# Patient Record
Sex: Male | Born: 1940
Health system: Southern US, Community
[De-identification: ages and names within clinical notes are randomized; demographics above are authoritative.]

## PROBLEM LIST (undated history)

## (undated) DIAGNOSIS — I452 Bifascicular block: Secondary | ICD-10-CM

## (undated) DIAGNOSIS — E1165 Type 2 diabetes mellitus with hyperglycemia: Secondary | ICD-10-CM

## (undated) DIAGNOSIS — Z86010 Personal history of colonic polyps: Secondary | ICD-10-CM

## (undated) DIAGNOSIS — N183 Chronic kidney disease, stage 3 unspecified: Secondary | ICD-10-CM

## (undated) DIAGNOSIS — I2510 Atherosclerotic heart disease of native coronary artery without angina pectoris: Secondary | ICD-10-CM

## (undated) DIAGNOSIS — N200 Calculus of kidney: Secondary | ICD-10-CM

## (undated) DIAGNOSIS — Z85828 Personal history of other malignant neoplasm of skin: Secondary | ICD-10-CM

## (undated) DIAGNOSIS — Z973 Presence of spectacles and contact lenses: Secondary | ICD-10-CM

## (undated) DIAGNOSIS — Z974 Presence of external hearing-aid: Secondary | ICD-10-CM

## (undated) DIAGNOSIS — I252 Old myocardial infarction: Secondary | ICD-10-CM

## (undated) DIAGNOSIS — E785 Hyperlipidemia, unspecified: Secondary | ICD-10-CM

## (undated) DIAGNOSIS — Z9889 Other specified postprocedural states: Secondary | ICD-10-CM

## (undated) DIAGNOSIS — IMO0002 Reserved for concepts with insufficient information to code with codable children: Secondary | ICD-10-CM

## (undated) DIAGNOSIS — K219 Gastro-esophageal reflux disease without esophagitis: Secondary | ICD-10-CM

## (undated) DIAGNOSIS — N281 Cyst of kidney, acquired: Secondary | ICD-10-CM

## (undated) DIAGNOSIS — Z860101 Personal history of adenomatous and serrated colon polyps: Secondary | ICD-10-CM

## (undated) DIAGNOSIS — Z955 Presence of coronary angioplasty implant and graft: Secondary | ICD-10-CM

## (undated) HISTORY — PX: CORONARY ANGIOPLASTY: SHX604

## (undated) HISTORY — DX: Hyperlipidemia, unspecified: E78.5

## (undated) HISTORY — PX: EXTRACORPOREAL SHOCK WAVE LITHOTRIPSY: SHX1557

## (undated) HISTORY — PX: CORONARY ANGIOPLASTY WITH STENT PLACEMENT: SHX49

## (undated) HISTORY — PX: CARDIOVASCULAR STRESS TEST: SHX262

---

## 1996-03-18 HISTORY — PX: LUMBAR MICRODISCECTOMY: SHX99

## 1998-03-18 DIAGNOSIS — C4491 Basal cell carcinoma of skin, unspecified: Secondary | ICD-10-CM

## 1998-03-18 HISTORY — DX: Basal cell carcinoma of skin, unspecified: C44.91

## 2002-07-16 ENCOUNTER — Ambulatory Visit (HOSPITAL_COMMUNITY): Admission: RE | Admit: 2002-07-16 | Discharge: 2002-07-16 | Payer: Self-pay | Admitting: Internal Medicine

## 2006-09-03 ENCOUNTER — Ambulatory Visit (HOSPITAL_COMMUNITY): Admission: RE | Admit: 2006-09-03 | Discharge: 2006-09-03 | Payer: Self-pay | Admitting: Orthopaedic Surgery

## 2006-09-09 ENCOUNTER — Ambulatory Visit (HOSPITAL_COMMUNITY): Admission: RE | Admit: 2006-09-09 | Discharge: 2006-09-09 | Payer: Self-pay | Admitting: Orthopaedic Surgery

## 2009-03-18 DIAGNOSIS — C4492 Squamous cell carcinoma of skin, unspecified: Secondary | ICD-10-CM

## 2009-03-18 HISTORY — DX: Squamous cell carcinoma of skin, unspecified: C44.92

## 2010-08-03 NOTE — Op Note (Signed)
   NAME:  Maurice Ross, Maurice Ross                           ACCOUNT NO.:  [id number]   MEDICAL RECORD NO.:  [id number]                   PATIENT TYPE:  AMB   LOCATION:  DAY                                  FACILITY:  [facility]   PHYSICIAN:  Lionel December, M.D.                 DATE OF BIRTH:  08-15-1940   DATE OF PROCEDURE:  07/16/2002  DATE OF DISCHARGE:                                 OPERATIVE REPORT   PROCEDURE:  Total colonoscopy.   INDICATIONS FOR PROCEDURE:  The patient is a 70 year old Caucasian male who  is undergoing screening colonoscopy.  The procedure was reviewed with the  patient, and informed consent was obtained.   PREOPERATIVE MEDICATIONS:  Demerol 25 mg IV, Versed 4 mg IV in divided  doses.   INSTRUMENT USED:  Olympus video system.   FINDINGS:  The procedure was performed in the endoscopy suite.  The  patient's vital signs and O2 saturations were monitored during the procedure  and remained stable.  The patient was placed in the left lateral recumbent  position and rectal examination performed.  No abnormality noted on external  or digital exam.  The scope was placed into the rectum and advanced to the  region of the sigmoid colon and beyond.  The preparation was excellent.  The  scope was passed to the cecum which was identified by the appendiceal  orifice and ileocecal valve.  There was a tiny polyp close to the  appendiceal orifice which was ablated by cold biopsy.  As the scope was  withdrawn, the colonic mucosa was carefully examined and was normal  throughout.  The rectal mucosa similarly was normal.  The scope was  retroflexed to examine the anorectal junction which was unremarkable.  The  endoscope was straightened and withdrawn.  The patient tolerated the  procedure well.   FINAL DIAGNOSIS:  Single small polyp at cecum which was ablated by cold  biopsy.  Colonoscopy otherwise normal.   RECOMMENDATIONS:  He will resume his usual medications.  I will contact the  patient with the biopsy results and further recommendations.  If this is an  adenoma, he will return for followup exam in 5 years, otherwise will wait  for 10.                                               Lionel December, M.D.   NR/MEDQ  D:  07/16/2002  T:  07/16/2002  Job:  [id number]   cc:   Selinda Flavin  [address]  [place]  [place] [address]  Fax: [phone]

## 2011-06-24 DIAGNOSIS — I2510 Atherosclerotic heart disease of native coronary artery without angina pectoris: Secondary | ICD-10-CM | POA: Diagnosis not present

## 2011-06-24 DIAGNOSIS — E785 Hyperlipidemia, unspecified: Secondary | ICD-10-CM | POA: Diagnosis not present

## 2011-06-24 DIAGNOSIS — IMO0001 Reserved for inherently not codable concepts without codable children: Secondary | ICD-10-CM | POA: Diagnosis not present

## 2011-07-01 DIAGNOSIS — Z79899 Other long term (current) drug therapy: Secondary | ICD-10-CM | POA: Diagnosis not present

## 2011-07-01 DIAGNOSIS — E119 Type 2 diabetes mellitus without complications: Secondary | ICD-10-CM | POA: Diagnosis not present

## 2011-07-01 DIAGNOSIS — E559 Vitamin D deficiency, unspecified: Secondary | ICD-10-CM | POA: Diagnosis not present

## 2011-07-01 DIAGNOSIS — I2510 Atherosclerotic heart disease of native coronary artery without angina pectoris: Secondary | ICD-10-CM | POA: Diagnosis not present

## 2011-07-01 DIAGNOSIS — E7800 Pure hypercholesterolemia, unspecified: Secondary | ICD-10-CM | POA: Diagnosis not present

## 2011-07-29 DIAGNOSIS — H353190 Nonexudative age-related macular degeneration, unspecified eye, stage unspecified: Secondary | ICD-10-CM | POA: Diagnosis not present

## 2011-10-14 DIAGNOSIS — T169XXA Foreign body in ear, unspecified ear, initial encounter: Secondary | ICD-10-CM | POA: Diagnosis not present

## 2011-10-24 DIAGNOSIS — Z9861 Coronary angioplasty status: Secondary | ICD-10-CM | POA: Diagnosis not present

## 2011-10-24 DIAGNOSIS — I2510 Atherosclerotic heart disease of native coronary artery without angina pectoris: Secondary | ICD-10-CM | POA: Diagnosis not present

## 2011-10-24 DIAGNOSIS — E119 Type 2 diabetes mellitus without complications: Secondary | ICD-10-CM | POA: Diagnosis not present

## 2011-10-24 DIAGNOSIS — I10 Essential (primary) hypertension: Secondary | ICD-10-CM | POA: Diagnosis not present

## 2011-11-05 DIAGNOSIS — Z79899 Other long term (current) drug therapy: Secondary | ICD-10-CM | POA: Diagnosis not present

## 2011-11-05 DIAGNOSIS — E782 Mixed hyperlipidemia: Secondary | ICD-10-CM | POA: Diagnosis not present

## 2011-11-15 DIAGNOSIS — N201 Calculus of ureter: Secondary | ICD-10-CM | POA: Diagnosis not present

## 2011-11-15 DIAGNOSIS — Z7982 Long term (current) use of aspirin: Secondary | ICD-10-CM | POA: Diagnosis not present

## 2011-11-15 DIAGNOSIS — N281 Cyst of kidney, acquired: Secondary | ICD-10-CM | POA: Diagnosis not present

## 2011-11-15 DIAGNOSIS — N289 Disorder of kidney and ureter, unspecified: Secondary | ICD-10-CM | POA: Diagnosis not present

## 2011-11-15 DIAGNOSIS — Z79899 Other long term (current) drug therapy: Secondary | ICD-10-CM | POA: Diagnosis not present

## 2011-11-15 DIAGNOSIS — E119 Type 2 diabetes mellitus without complications: Secondary | ICD-10-CM | POA: Diagnosis not present

## 2011-11-15 DIAGNOSIS — Z87442 Personal history of urinary calculi: Secondary | ICD-10-CM | POA: Diagnosis not present

## 2011-11-15 DIAGNOSIS — N23 Unspecified renal colic: Secondary | ICD-10-CM | POA: Diagnosis not present

## 2011-11-21 DIAGNOSIS — I2510 Atherosclerotic heart disease of native coronary artery without angina pectoris: Secondary | ICD-10-CM | POA: Diagnosis not present

## 2011-11-21 DIAGNOSIS — D4959 Neoplasm of unspecified behavior of other genitourinary organ: Secondary | ICD-10-CM | POA: Diagnosis not present

## 2011-11-21 DIAGNOSIS — E119 Type 2 diabetes mellitus without complications: Secondary | ICD-10-CM | POA: Diagnosis not present

## 2011-11-21 DIAGNOSIS — E782 Mixed hyperlipidemia: Secondary | ICD-10-CM | POA: Diagnosis not present

## 2011-11-21 DIAGNOSIS — N200 Calculus of kidney: Secondary | ICD-10-CM | POA: Diagnosis not present

## 2011-11-21 DIAGNOSIS — N281 Cyst of kidney, acquired: Secondary | ICD-10-CM | POA: Diagnosis not present

## 2011-11-22 ENCOUNTER — Other Ambulatory Visit: Payer: Self-pay | Admitting: Urology

## 2011-11-22 ENCOUNTER — Encounter (HOSPITAL_COMMUNITY): Payer: Self-pay | Admitting: *Deleted

## 2011-11-22 NOTE — Progress Notes (Signed)
Instructed to read every page in blue folder and fill out history for for [facility]. No medications on the restricted med list per [facility]. Laxative as directed and light supper day before procedure. Light breakfast at 7am and clear liquids till 11 am then NPO. Do not take diabetes medication no aspirin (held since 11/20/11) or ibuprofen, vutamins or herbal meds.  Must have driver

## 2011-11-25 ENCOUNTER — Encounter (HOSPITAL_COMMUNITY)
Admission: RE | Disposition: A | Discharge status: Home / Self Care | Payer: Self-pay | Source: Ambulatory Visit | Attending: Urology

## 2011-11-25 ENCOUNTER — Ambulatory Visit (HOSPITAL_COMMUNITY): Payer: Medicare Other

## 2011-11-25 ENCOUNTER — Encounter (HOSPITAL_COMMUNITY): Payer: Self-pay | Admitting: *Deleted

## 2011-11-25 ENCOUNTER — Ambulatory Visit (HOSPITAL_COMMUNITY)
Admission: RE | Admit: 2011-11-25 | Discharge: 2011-11-25 | Disposition: A | Discharge status: Home / Self Care | Payer: Medicare Other | Source: Ambulatory Visit | Attending: Urology | Admitting: Urology

## 2011-11-25 DIAGNOSIS — R109 Unspecified abdominal pain: Secondary | ICD-10-CM | POA: Diagnosis not present

## 2011-11-25 DIAGNOSIS — E119 Type 2 diabetes mellitus without complications: Secondary | ICD-10-CM | POA: Insufficient documentation

## 2011-11-25 DIAGNOSIS — D4959 Neoplasm of unspecified behavior of other genitourinary organ: Secondary | ICD-10-CM | POA: Diagnosis not present

## 2011-11-25 DIAGNOSIS — I252 Old myocardial infarction: Secondary | ICD-10-CM | POA: Diagnosis not present

## 2011-11-25 DIAGNOSIS — Z79899 Other long term (current) drug therapy: Secondary | ICD-10-CM | POA: Diagnosis not present

## 2011-11-25 DIAGNOSIS — N281 Cyst of kidney, acquired: Secondary | ICD-10-CM | POA: Insufficient documentation

## 2011-11-25 DIAGNOSIS — Z7982 Long term (current) use of aspirin: Secondary | ICD-10-CM | POA: Insufficient documentation

## 2011-11-25 DIAGNOSIS — N200 Calculus of kidney: Secondary | ICD-10-CM | POA: Diagnosis not present

## 2011-11-25 HISTORY — DX: Atherosclerotic heart disease of native coronary artery without angina pectoris: I25.10

## 2011-11-25 SURGERY — LITHOTRIPSY, ESWL
Anesthesia: LOCAL | Laterality: Left

## 2011-11-25 MED ORDER — DIPHENHYDRAMINE HCL 25 MG PO CAPS
25.0000 mg | ORAL_CAPSULE | ORAL | Status: AC
  Administered 2011-11-25: 25 mg via ORAL
  Filled 2011-11-25: qty 1

## 2011-11-25 MED ORDER — DEXTROSE-NACL 5-0.45 % IV SOLN
INTRAVENOUS | Status: DC
  Administered 2011-11-25: 16:00:00 via INTRAVENOUS

## 2011-11-25 MED ORDER — CIPROFLOXACIN HCL 500 MG PO TABS
500.0000 mg | ORAL_TABLET | ORAL | Status: AC
  Administered 2011-11-25: 500 mg via ORAL
  Filled 2011-11-25: qty 1

## 2011-11-25 MED ORDER — DIAZEPAM 5 MG PO TABS
10.0000 mg | ORAL_TABLET | ORAL | Status: AC
  Administered 2011-11-25: 10 mg via ORAL
  Filled 2011-11-25: qty 2

## 2011-11-25 NOTE — H&P (Signed)
History of Present Illness  Maurice Ross is referred by Dr Selinda Flavin for evaluation of left upper quadrant pain.  Maurice Ross was seen in the ER at [facility] for severe left upper quadrant pain.  The pain was not associated with nausea and vomiting.  He has nocturia.  He does not gross hematuria.  He has a past history of kidney stone.  CT scan revealed a 1 cm non obstructing stone in the lower pole of the left kidney, bilateral renalcysts and a subcentimeter hyperdense lesion in the interpolar region of the left kidney.  He still has mild left upper quadrant pain.  He takes hydrocodone as needed for the pain.  BUN is 35, creatinine 2.21.   Past Medical History Problems  1. History of  Acute Myocardial Infarction V12.59 2. History of  Diabetes Mellitus 250.00 3. History of  Heartburn 787.1  Surgical History Problems  1. History of  Back Surgery 2. History of  Cath Stent Placement  Current Meds 1. Aspirin 325 MG Oral Tablet; Therapy: (Recorded:05Sep2013) to 2. Crestor 40 MG Oral Tablet; Therapy: (Recorded:05Sep2013) to 3. Fish Oil CAPS; Therapy: (Recorded:05Sep2013) to 4. GlipiZIDE 10 MG Oral Tablet; Therapy: (Recorded:05Sep2013) to 5. Hydrochlorothiazide 25 MG Oral Tablet; Therapy: (Recorded:05Sep2013) to 6. Lisinopril 10 MG Oral Tablet; Therapy: (Recorded:05Sep2013) to 7. MetFORMIN HCl 500 MG Oral Tablet; Therapy: (Recorded:05Sep2013) to 8. Metoprolol Tartrate 25 MG Oral Tablet; Therapy: (Recorded:05Sep2013) to 9. Omeprazole 20 MG Oral Capsule Delayed Release; Therapy: (Recorded:05Sep2013) to  Allergies Medication  1. No Known Drug Allergies  Family History Problems  1. Family history of  Family Health Status - Father's Age 78. Family history of  Family Health Status - Mother's Age 40. Family history of  Family Health Status Number Of Children 4. Family history of  Heart Disease V17.49  Social History Problems    Caffeine Use   Marital History - Currently Married   Never  A Smoker   Occupation: Denied    History of  Alcohol Use  Review of Systems Genitourinary, constitutional, skin, eye, otolaryngeal, hematologic/lymphatic, cardiovascular, pulmonary, endocrine, musculoskeletal, gastrointestinal, neurological and psychiatric system(s) were reviewed and pertinent findings if present are noted.  Genitourinary: nocturia.  Gastrointestinal: abdominal pain, heartburn and constipation.    Vitals Vital Signs [Data Includes: Last 1 Day]  05Sep2013 03:13PM  BMI Calculated: 30.62 BSA Calculated: 2.05 Height: 5 ft 8 in Weight: 202 lb  Blood Pressure: 100 / 61 Heart Rate: 49 Respiration: 18  Physical Exam Constitutional: Well nourished and well developed . No acute distress.  ENT:. The ears and nose are normal in appearance.  Neck: The appearance of the neck is normal and no neck mass is present.  Pulmonary: No respiratory distress and normal respiratory rhythm and effort.  Cardiovascular: Heart rate and rhythm are normal . No peripheral edema.  Abdomen: The abdomen is soft and nontender. No masses are palpated. No CVA tenderness. No hernias are palpable. No hepatosplenomegaly noted.  Rectal: Rectal exam demonstrates normal sphincter tone, no tenderness and no masses. The prostate has no nodularity and is not tender. The left seminal vesicle is nonpalpable. The right seminal vesicle is nonpalpable. The perineum is normal on inspection.  Genitourinary: Examination of the penis demonstrates no discharge, no masses, no lesions and a normal meatus. The scrotum is without lesions. The right epididymis is palpably normal and non-tender. The left epididymis is palpably normal and non-tender. The right testis is non-tender and without masses. The left testis is non-tender and without masses.  Lymphatics: The femoral and inguinal nodes are not enlarged or tender.  Skin: Normal skin turgor, no visible rash and no visible skin lesions.  Neuro/Psych:. Mood and affect are  appropriate.    Results/Data Urine [Data Includes: Last 1 Day]   05Sep2013  COLOR YELLOW   APPEARANCE CLOUDY   SPECIFIC GRAVITY 1.020   pH 5.5   GLUCOSE 250 mg/dL  BILIRUBIN NEG   KETONE NEG mg/dL  BLOOD LARGE   PROTEIN 100 mg/dL  UROBILINOGEN 0.2 mg/dL  NITRITE NEG   LEUKOCYTE ESTERASE NEG   SQUAMOUS EPITHELIAL/HPF RARE   WBC NONE SEEN WBC/hpf  RBC NONE SEEN RBC/hpf  BACTERIA NONE SEEN   CRYSTALS NONE SEEN   CASTS NONE SEEN     I independently reviewed the CT scan and the findings are as noted above.   Assessment Assessed  1. Nephrolithiasis Of The Left Kidney 592.0 2. Renal Neoplasm 239.5 3. Renal Cyst 593.2  Plan Health Maintenance (V70.0)  1. UA With REFLEX  Done: 05Sep2013 02:22PM Nephrolithiasis Of The Left Kidney (592.0)  2. Follow-up Schedule Surgery Office  Follow-up  Requested for: 05Sep2013   Since the patient is symptomatic will do ESL of the renal calculus.  The procedure, risks, benefits were explained to him.  The risks include but are not limited to renal or perirenal hematoma, injury to adjacent organs, steinstrasse.  He understands and wishes to proceed.  Will get renal ultrasound for further evaluation of the left renal hyperdense lesion.   Signatures  CC: Dr Selinda Flavin  Electronically signed by : Su Grand, M.D.; Nov 21 2011  5:59PM

## 2011-11-25 NOTE — Op Note (Signed)
Refer to [facility] Op Note scanned in the chart 

## 2011-12-09 ENCOUNTER — Encounter (INDEPENDENT_AMBULATORY_CARE_PROVIDER_SITE_OTHER): Payer: Self-pay

## 2011-12-17 DIAGNOSIS — Z23 Encounter for immunization: Secondary | ICD-10-CM | POA: Diagnosis not present

## 2011-12-19 DIAGNOSIS — N200 Calculus of kidney: Secondary | ICD-10-CM | POA: Diagnosis not present

## 2011-12-20 DIAGNOSIS — N200 Calculus of kidney: Secondary | ICD-10-CM | POA: Diagnosis not present

## 2011-12-30 ENCOUNTER — Ambulatory Visit (INDEPENDENT_AMBULATORY_CARE_PROVIDER_SITE_OTHER): Payer: Medicare Other | Admitting: Internal Medicine

## 2011-12-30 ENCOUNTER — Other Ambulatory Visit (INDEPENDENT_AMBULATORY_CARE_PROVIDER_SITE_OTHER): Payer: Self-pay | Admitting: *Deleted

## 2011-12-30 ENCOUNTER — Encounter (INDEPENDENT_AMBULATORY_CARE_PROVIDER_SITE_OTHER): Payer: Self-pay | Admitting: Internal Medicine

## 2011-12-30 ENCOUNTER — Telehealth (INDEPENDENT_AMBULATORY_CARE_PROVIDER_SITE_OTHER): Payer: Self-pay | Admitting: *Deleted

## 2011-12-30 VITALS — BP 118/72 | HR 80 | Temp 97.8°F | Resp 18 | Ht 68.0 in | Wt 197.1 lb

## 2011-12-30 DIAGNOSIS — Z87442 Personal history of urinary calculi: Secondary | ICD-10-CM | POA: Insufficient documentation

## 2011-12-30 DIAGNOSIS — K219 Gastro-esophageal reflux disease without esophagitis: Secondary | ICD-10-CM

## 2011-12-30 DIAGNOSIS — Z1211 Encounter for screening for malignant neoplasm of colon: Secondary | ICD-10-CM | POA: Diagnosis not present

## 2011-12-30 DIAGNOSIS — R1310 Dysphagia, unspecified: Secondary | ICD-10-CM

## 2011-12-30 DIAGNOSIS — Z86010 Personal history of colonic polyps: Secondary | ICD-10-CM

## 2011-12-30 DIAGNOSIS — E119 Type 2 diabetes mellitus without complications: Secondary | ICD-10-CM | POA: Insufficient documentation

## 2011-12-30 DIAGNOSIS — E785 Hyperlipidemia, unspecified: Secondary | ICD-10-CM | POA: Insufficient documentation

## 2011-12-30 DIAGNOSIS — I2510 Atherosclerotic heart disease of native coronary artery without angina pectoris: Secondary | ICD-10-CM | POA: Insufficient documentation

## 2011-12-30 MED ORDER — PANTOPRAZOLE SODIUM 40 MG PO TBEC: 40.0000 mg | DELAYED_RELEASE_TABLET | Freq: Two times a day (BID) | ORAL | Status: DC

## 2011-12-30 MED ORDER — PEG-KCL-NACL-NASULF-NA ASC-C 100 G PO SOLR: 1.0000 | Freq: Once | ORAL | Status: DC

## 2011-12-30 NOTE — Consult Note (Signed)
CONSULTING PHYSICIAN: Selinda Flavin, MD  PRESENTING COMPLAINT: Dysphagia, cough and hoarseness. Patient is  also interested in screening colonoscopy.  HISTORY OF PRESENT ILLNESS: The patient is a 71 year old Caucasian male  who is referred through courtesy of Dr. Selinda Flavin for GI evaluation.  He is accompanied by his wife today. He has had symptoms of GERD for  about 10 years. He states he has had swallowing difficulty for the last  few years. It has been intermittent. He has difficulty with solids as  well as liquids. Lately, he has noted nocturnal cough. He wakes up  couple of times in a week with this cough. He also has coughing spells  during the daytime and intermittent hoarseness. He denies frequent  regurgitation or burping. He also complains of infrequent heartburn as  long as he takes his medication. He does admit that he eats too fast.  Food bolus always passes down. He has a good appetite. He is trying  hard to lose weight. He has lost 10 pounds in the last 3 months. He  wants to lose another 20 pounds. The patient is also interested in  colonoscopy. His last exam was in May 2004, with removal of small cecal  adenoma. He denies melena or rectal bleeding.  PRESENT MEDICATIONS:  1. Aspirin 325 mg p.o. daily.  2. Fish oil 1.2 g p.o. b.i.d.  3. Glipizide 10 mg p.o. b.i.d.  4. Hydrochlorothiazide 25 mg p.o. daily.  5. Lisinopril 15 mg p.o. daily.  6. Metformin 1 g p.o. b.i.d.  7. Metoprolol tartrate 25 mg p.o. b.i.d.  8. Omeprazole 20 mg p.o. q.a.m.  9. Rosuvastatin 40 mg p.o. daily.  PAST MEDICAL HISTORY:  1. Coronary artery disease. He had stenting to single vessel in July  1997. He had another intervention in March 1998, what sounds like  angioplasty from his description. He had exercise tolerance test  in August 2013, and it was fine. He is under care of Dr. Nicki Guadalajara of [facility] cardiovascular.  2. Chronic GERD. He has had symptoms for more than 10 years.  3.  Hyperlipidemia.  4. Diabetes mellitus diagnosed in 1998. His hemoglobin A1c was 8.8 in  April, and he will have another 1 later this year.  5. He had lumbar  spine surgery for his disk disease in January 1998.  6. He has history  of left kidney stones. He underwent lithotripsy and passed  multiple stones this year.  ALLERGIES: NK.  FAMILY HISTORY: Father is 56 years old and is cared for [employer]. He has  early dementia and is blind. Mother is 19 and lives at [facility] in  [place], [place]. The patient does not have any siblings.  SOCIAL HISTORY: He is married and has 2 sons in good health. 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 He has never  smoked cigarettes or drank alcohol.  PHYSICAL EXAMINATION: VITAL SIGNS: Weight 197 pounds, he is 68 inches  tall, pulse 80 per minute and regular, blood pressure 118/72,  respiratory rate is 18, and temp is 97.8.  HEENT: Conjunctivae are pink. Sclerae are nonicteric. Oropharyngeal  mucosa is normal. Few teeth are missing, rest of the dentition in  satisfactory condition.  NECK: No neck masses or thyromegaly noted.  CARDIAC: Regular rhythm. Normal S1 and S2. No murmur or gallop noted.  LUNGS: Clear to auscultation.  ABDOMEN: Full. Bowel sounds are normal. On palpation, it is soft and  nontender without organomegaly or masses.  RECTAL: Deferred.  EXTREMITIES: No peripheral edema or clubbing noted.  ASSESSMENT: Dysphagia. He has had this symptom intermittently for  several months. He has sporadic symptoms and difficulty both with  solids as well as liquids. He is also experiencing intermittent diurnal  and nocturnal cough as well as hoarseness and, therefore, his  gastroesophageal reflux disease symptoms are not controlled  satisfactorily. He could have esophageal ring or a stricture. He needs  to undergo EGD both for diagnostic and therapeutic purposes.  He would appear to be average risk for colorectal carcinoma.  Last  colonoscopy over 9 years ago revealed single small adenoma which  necessarily would not put him in high-risk category.  RECOMMENDATIONS:  1. Continue anti-reflux measures.  2. Discontinue omeprazole.  3. Pantoprazole 40 mg p.o. b.i.d. prescription given for 60 with 5  refills. Dose would eventually be decreased to once a day.  4. Esophagogastroduodenoscopy, possible esophageal dilation and  colonoscopy to be performed at [facility] in near future.Patient  advised to hold off aspirin for 2 days prior to procedure.  We appreciate the opportunity to participate in the care of this  gentleman.

## 2011-12-30 NOTE — Telephone Encounter (Signed)
Patient needs movi prep 

## 2011-12-30 NOTE — Patient Instructions (Signed)
Discontinue omeprazole. Pantoprazole 40 mg by mouth 30 minutes before breakfast and evening meal daily.  Esophagogastroduodenoscopy, possible dilation and colonoscopy to be scheduled. Hold aspirin for 2 days prior to procedure.

## 2011-12-30 NOTE — Consult Note (Signed)
NAME:  Maurice Ross, NUTTING NO.:  [id number]  MEDICAL RECORD NO.:  [id number]  LOCATION:  [facility]                         FACILITY:  [facility]  PHYSICIAN:  Lionel December, M.D.    DATE OF BIRTH:  1940-10-24  DATE OF CONSULTATION:  12/30/2011 DATE OF DISCHARGE:  11/25/2011                                CONSULTATION   CONSULTING PHYSICIAN:  Selinda Flavin, MD  PRESENTING COMPLAINT:  Dysphagia, cough and hoarseness. Patient is  also interested in screening colonoscopy.  HISTORY OF PRESENT ILLNESS:  The patient is a 71 year old Caucasian male who is referred through courtesy of Dr. Selinda Flavin for GI evaluation. He is accompanied by his wife today.  He has had symptoms of GERD for about 10 years.  He states he has had swallowing difficulty for the last few years.  It has been intermittent.  He has difficulty with solids as well as liquids.  Lately, he has noted nocturnal cough.  He wakes up couple of times in a week with this cough.  He also has coughing spells during the daytime and intermittent hoarseness.  He denies frequent regurgitation or burping.  He also complains of infrequent heartburn as long as he takes his medication.  He does admit that he eats too fast. Food bolus always passes down.  He has a good appetite.  He is trying hard to lose weight.  He has lost 10 pounds in the last 3 months.  He wants to lose another 20 pounds.  The patient is also interested in colonoscopy.  His last exam was in May 2004, with removal of small cecal adenoma.  He denies melena or rectal bleeding.  PRESENT MEDICATIONS: 1. Aspirin 325 mg p.o. daily. 2. Fish oil 1.2 g p.o. b.i.d. 3. Glipizide 10 mg p.o. b.i.d. 4. Hydrochlorothiazide 25 mg p.o. daily. 5. Lisinopril 15 mg p.o. daily. 6. Metformin 1 g p.o. b.i.d. 7. Metoprolol tartrate 25 mg p.o. b.i.d. 8. Omeprazole 20 mg p.o. q.a.m. 9. Rosuvastatin 40 mg p.o. daily.  PAST MEDICAL HISTORY: 1. Coronary artery disease.  He had  stenting to single vessel in July     1997.  He had another intervention in March 1998, what sounds like     angioplasty from his description.  He had exercise tolerance test     in August 2013, and it was fine.  He is under care of Dr. Nicki Guadalajara of [facility]. 2. Chronic GERD.  He has had symptoms for more than 10 years. 3. Hyperlipidemia. 4. Diabetes mellitus diagnosed in 1998.  His hemoglobin A1c was 8.8 in     April, and he will have another 1 later this year.   5. He had lumbar     spine surgery for his disk disease in January 1998.   6. He has history     of left kidney stones.  He underwent lithotripsy and passed     multiple stones this year.  ALLERGIES:  NK.  FAMILY HISTORY:  Father is 50 years old and is cared for [employer].  He has early dementia and is blind.  Mother is 58 and lives at [facility] in  [place], [place].  The patient does not have any siblings.  SOCIAL HISTORY:  He is married and has 2 sons in good health.  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  He has never smoked cigarettes or drank alcohol.  PHYSICAL EXAMINATION:  VITAL SIGNS:  Weight 197 pounds, he is 68 inches tall, pulse 80 per minute and regular, blood pressure 118/72, respiratory rate is 18, and temp is 97.8. HEENT:  Conjunctivae are pink.  Sclerae are nonicteric.  Oropharyngeal mucosa is normal.  Few teeth are missing, rest of the dentition in satisfactory condition. NECK:  No neck masses or thyromegaly noted. CARDIAC:  Regular rhythm.  Normal S1 and S2.  No murmur or gallop noted. LUNGS:  Clear to auscultation. ABDOMEN:  Full.  Bowel sounds are normal.  On palpation, it is soft and nontender without organomegaly or masses. RECTAL:  Deferred. EXTREMITIES:  No peripheral edema or clubbing noted.  ASSESSMENT:  Dysphagia.  He has had this symptom intermittently for several months.  He has sporadic symptoms and difficulty both with solids as  well as liquids.  He is also experiencing intermittent diurnal and nocturnal cough as well as hoarseness and, therefore, his gastroesophageal reflux disease symptoms are not controlled satisfactorily.  He could have esophageal ring or a stricture.  He needs to undergo EGD both for diagnostic and therapeutic purposes.  He would appear to be average risk for colorectal carcinoma.  Last colonoscopy over 9 years ago revealed single small adenoma which necessarily would not put him in high-risk category.  RECOMMENDATIONS: 1. Continue anti-reflux measures. 2. Discontinue omeprazole. 3. Pantoprazole 40 mg p.o. b.i.d. prescription given for 60 with 5     refills.  Dose would eventually be decreased to once a day. 4. Esophagogastroduodenoscopy, possible esophageal dilation and     colonoscopy to be performed at [facility] in near future.Patient     advised to hold off aspirin for 2 days prior to procedure.  We appreciate the opportunity to participate in the care of this gentleman.          ______________________________ Lionel December, M.D.     NR/MEDQ  D:  12/30/2011  T:  12/30/2011  Job:  [id number]  cc:   Selinda Flavin, MD Fax: [phone]

## 2011-12-31 NOTE — Progress Notes (Signed)
p 

## 2012-01-06 DIAGNOSIS — N200 Calculus of kidney: Secondary | ICD-10-CM | POA: Diagnosis not present

## 2012-01-16 ENCOUNTER — Encounter (HOSPITAL_COMMUNITY): Payer: Self-pay | Admitting: Pharmacy Technician

## 2012-01-24 ENCOUNTER — Encounter (HOSPITAL_COMMUNITY)
Admission: RE | Disposition: A | Discharge status: Home / Self Care | Payer: Self-pay | Source: Ambulatory Visit | Attending: Internal Medicine

## 2012-01-24 ENCOUNTER — Encounter (HOSPITAL_COMMUNITY): Payer: Self-pay | Admitting: *Deleted

## 2012-01-24 ENCOUNTER — Ambulatory Visit (HOSPITAL_COMMUNITY)
Admission: RE | Admit: 2012-01-24 | Discharge: 2012-01-24 | Disposition: A | Discharge status: Home / Self Care | Payer: Medicare Other | Source: Ambulatory Visit | Attending: Internal Medicine | Admitting: Internal Medicine

## 2012-01-24 DIAGNOSIS — E119 Type 2 diabetes mellitus without complications: Secondary | ICD-10-CM | POA: Insufficient documentation

## 2012-01-24 DIAGNOSIS — Z1211 Encounter for screening for malignant neoplasm of colon: Secondary | ICD-10-CM | POA: Insufficient documentation

## 2012-01-24 DIAGNOSIS — K219 Gastro-esophageal reflux disease without esophagitis: Secondary | ICD-10-CM

## 2012-01-24 DIAGNOSIS — K2960 Other gastritis without bleeding: Secondary | ICD-10-CM

## 2012-01-24 DIAGNOSIS — K2940 Chronic atrophic gastritis without bleeding: Secondary | ICD-10-CM | POA: Diagnosis not present

## 2012-01-24 DIAGNOSIS — K2970 Gastritis, unspecified, without bleeding: Secondary | ICD-10-CM

## 2012-01-24 DIAGNOSIS — R1310 Dysphagia, unspecified: Secondary | ICD-10-CM | POA: Insufficient documentation

## 2012-01-24 DIAGNOSIS — D126 Benign neoplasm of colon, unspecified: Secondary | ICD-10-CM | POA: Insufficient documentation

## 2012-01-24 DIAGNOSIS — Z86010 Personal history of colonic polyps: Secondary | ICD-10-CM

## 2012-01-24 DIAGNOSIS — K5730 Diverticulosis of large intestine without perforation or abscess without bleeding: Secondary | ICD-10-CM

## 2012-01-24 HISTORY — PX: MALONEY DILATION: SHX5535

## 2012-01-24 HISTORY — PX: SAVORY DILATION: SHX5439

## 2012-01-24 HISTORY — PX: BALLOON DILATION: SHX5330

## 2012-01-24 HISTORY — PX: COLONOSCOPY WITH ESOPHAGOGASTRODUODENOSCOPY (EGD): SHX5779

## 2012-01-24 SURGERY — COLONOSCOPY WITH ESOPHAGOGASTRODUODENOSCOPY (EGD)
Anesthesia: Moderate Sedation

## 2012-01-24 MED ORDER — MIDAZOLAM HCL 5 MG/5ML IJ SOLN
INTRAMUSCULAR | Status: AC
  Filled 2012-01-24: qty 10

## 2012-01-24 MED ORDER — SODIUM CHLORIDE 0.45 % IV SOLN
INTRAVENOUS | Status: DC
  Administered 2012-01-24: 10:00:00 via INTRAVENOUS

## 2012-01-24 MED ORDER — STERILE WATER FOR IRRIGATION IR SOLN
Status: DC | PRN
  Administered 2012-01-24: 11:00:00

## 2012-01-24 MED ORDER — MEPERIDINE HCL 50 MG/ML IJ SOLN
INTRAMUSCULAR | Status: AC
  Filled 2012-01-24: qty 1

## 2012-01-24 MED ORDER — MEPERIDINE HCL 50 MG/ML IJ SOLN
INTRAMUSCULAR | Status: DC | PRN
  Administered 2012-01-24: 25 mg via INTRAVENOUS

## 2012-01-24 MED ORDER — MIDAZOLAM HCL 5 MG/5ML IJ SOLN
INTRAMUSCULAR | Status: DC | PRN
  Administered 2012-01-24 (×2): 2 mg via INTRAVENOUS

## 2012-01-24 NOTE — Op Note (Signed)
EGD PROCEDURE REPORT  PATIENT:  Maurice Ross  MR#:  [id number] Birthdate:  Oct 03, 1940, 71 y.o., male Endoscopist:  Dr. Malissa Hippo, MD Referred By:  Dr. Tyrone Nine, MD Procedure Date: 01/24/2012  Procedure:   EGD, ED & Colonoscopy.  Indications:  Patient is 71 year old Caucasian male with chronic GERD who presents with intermittent solid food dysphagia. He is also undergoing average risk screening colonoscopy.            Informed Consent:  The risks, benefits, alternatives & imponderables which include, but are not limited to, bleeding, infection, perforation, drug reaction and potential missed lesion have been reviewed.  The potential for biopsy, lesion removal, esophageal dilation, etc. have also been discussed.  Questions have been answered.  All parties agreeable.  Please see history & physical in medical record for more information.  Medications:  Demerol 25 mg IV Versed 4 mg IV Cetacaine spray topically for oropharyngeal anesthesia  EGD  Description of procedure:  The endoscope was introduced through the mouth and advanced to the second portion of the duodenum without difficulty or limitations. The mucosal surfaces were surveyed very carefully during advancement of the scope and upon withdrawal.  Findings:  Esophagus:  Mucosa of the esophagus was normal. GE junction was unremarkable. GEJ:  42 cm Stomach:  Stomach was empty and distended very well insufflation. Folds in the proximal stomach were normal. Examination of mucosa at body was normal. two antral erosions are noted along with focal erythema. Pyloric channel was patent. Angularis fundus and cardia were examined by retroflexing the scope and were normal. Duodenum:  Normal bulbar and post bulbar mucosa.  Therapeutic/Diagnostic Maneuvers Performed:  Esophagus dilated by passing 54 [place] Maloney dilator. Esophageal mucosa was reexamined post dilation and no mucosal disruption noted.  COLONOSCOPY Description of procedure:   After a digital rectal exam was performed, that colonoscope was advanced from the anus through the rectum and colon to the area of the cecum, ileocecal valve and appendiceal orifice. The cecum was deeply intubated. These structures were well-seen and photographed for the record. From the level of the cecum and ileocecal valve, the scope was slowly and cautiously withdrawn. The mucosal surfaces were carefully surveyed utilizing scope tip to flexion to facilitate fold flattening as needed. The scope was pulled down into the rectum where a thorough exam including retroflexion was performed.  Findings:   Prep excellent. Three small polyps ablated via cold biopsy from ascending colon and submitted together. Scattered diverticula at sigmoid colon. Normal rectal mucosa and anal rectal junction.  Therapeutic/Diagnostic Maneuvers Performed:  See above  Complications:  None  Cecal Withdrawal Time:  17 minutes  Impression:  Erosive antral gastritis otherwise normal EGD. Esophagus dilated  by passing 54 [place] Maloney dilator no mucosal disruption noted. Mild sigmoid colon diverticulosis. Three small polyps ablated via cold biopsy from ascending colon and submitted together.  Recommendations:  Continue anti-reflux measures and pantoprazole as before. H. pylori serology. I will be contacting patient with results of biopsy, blood test and further recommendations.  Deshawn Skelley U  01/24/2012 11:25 AM  CC: Dr. Selinda Flavin, MD & Dr. Bonnetta Barry ref. provider found

## 2012-01-24 NOTE — H&P (Signed)
Maurice Ross is an 71 y.o. male.   Chief Complaint: Patient is here for EGD, ED and colonoscopy. HPI: A 71 year old Caucasian male who has chronic GERD whose been experiencing intermittent hoarseness and has developed dysphagia to solids. She in the office few weeks when switched from omeprazole to pantoprazole that he has noted improvement in his hoarseness but not dysphagia. He rarely experiences heartburn. He has a good appetite. He denies abdominal pain change in bowel habits or rectal bleeding. She was last colonoscopy was 9 years ago. Family history is negative for colorectal carcinoma to  Past Medical History  Diagnosis Date  . Coronary artery disease     Coronary stent 1997  . Diabetes mellitus     medical treatment x 10 years  . Chronic kidney disease     [phone] kidney stones  . Myocardial infarction     1997    Past Surgical History  Procedure Date  . Back surgery 1998  . Stent 1998    coronary stent  . Colonoscopy 2004    By Dr[facility]Romy Ipock at [facility]  . Cardiac catheterization     Family History  Problem Relation Age of Onset  . Heart disease Mother   . Heart disease Father   . Healthy Son   . Healthy Son    Social History:  reports that he has been smoking Pipe.  He has never used smokeless tobacco. He reports that he does not drink alcohol or use illicit drugs.  Allergies: No Known Allergies  Medications Prior to Admission  Medication Sig Dispense Refill  . aspirin 325 MG tablet Take 325 mg by mouth daily.      Marland Kitchen glipiZIDE (GLUCOTROL XL) 10 MG 24 hr tablet Take 10 mg by mouth 2 (two) times daily.       . hydrochlorothiazide (HYDRODIURIL) 25 MG tablet Take 25 mg by mouth daily.      Marland Kitchen lisinopril (PRINIVIL,ZESTRIL) 10 MG tablet Take 15 mg by mouth daily.      . metFORMIN (GLUCOPHAGE) 500 MG tablet Take 1,000 mg by mouth 2 (two) times daily with a meal.       . metoprolol tartrate (LOPRESSOR) 25 MG tablet Take 25 mg by mouth 2 (two) times daily.      .  Omega-3 Fatty Acids (FISH OIL) 1200 MG CAPS Take 1 capsule by mouth daily.      Marland Kitchen omeprazole (PRILOSEC) 20 MG capsule Take 1 capsule by mouth Daily.      . pantoprazole (PROTONIX) 40 MG tablet Take 1 tablet (40 mg total) by mouth 2 (two) times daily before a meal.  60 tablet  3  . peg 3350 powder (MOVIPREP) 100 G SOLR Take 1 kit (100 g total) by mouth once.  1 kit  0  . rosuvastatin (CRESTOR) 40 MG tablet Take 40 mg by mouth daily.        No results found for this or any previous visit (from the past 48 hour(s)). No results found.  ROS  Blood pressure 132/70, pulse 73, temperature 97.8 F (36.6 C), temperature source Oral, resp. rate 16, height 5\' 8"  (1.727 m), weight 197 lb (89.359 kg), SpO2 100.00%. Physical Exam  Constitutional: He appears well-developed and well-nourished.  HENT:  Mouth/Throat: Oropharynx is clear and moist.  Eyes: Conjunctivae normal are normal. No scleral icterus.  Neck: No thyromegaly present.  Cardiovascular: Normal rate, regular rhythm and normal heart sounds.   No murmur heard. Respiratory: Effort normal and breath sounds normal.  GI: Soft. He exhibits no distension and no mass. There is no tenderness.  Musculoskeletal: He exhibits no edema.  Lymphadenopathy:    He has no cervical adenopathy.  Skin: Skin is warm and dry.     Assessment/Plan Chronic GERD. Solid food dysphagia. EGD, ED and  average risk screening colonoscopy.  Emery Dupuy U 01/24/2012, 10:40 AM

## 2012-01-27 DIAGNOSIS — N200 Calculus of kidney: Secondary | ICD-10-CM | POA: Diagnosis not present

## 2012-01-28 DIAGNOSIS — H2510 Age-related nuclear cataract, unspecified eye: Secondary | ICD-10-CM | POA: Diagnosis not present

## 2012-01-29 ENCOUNTER — Encounter (HOSPITAL_COMMUNITY): Payer: Self-pay | Admitting: Internal Medicine

## 2012-01-31 ENCOUNTER — Telehealth (INDEPENDENT_AMBULATORY_CARE_PROVIDER_SITE_OTHER): Payer: Self-pay | Admitting: *Deleted

## 2012-01-31 NOTE — Telephone Encounter (Signed)
Forward to Dr.Rehman 

## 2012-01-31 NOTE — Telephone Encounter (Signed)
Patient called and was given results of the bx - No High grade Dysplasia or Malignancy H-Pylori was < 40  Negatvie for H-pylori Patient ask if he is to continue to take the Pantoprazole that was given to him at the time of the procedure: Patient advised that we would call him back and also that Dr.Rehman may call him to go over results.

## 2012-01-31 NOTE — Telephone Encounter (Signed)
Forwarded to Dr.Rehman as FYI. 

## 2012-01-31 NOTE — Telephone Encounter (Signed)
LM asking for a return call about his procedure and lab results. The return phone number is [phone].

## 2012-02-02 NOTE — Telephone Encounter (Signed)
Patient called and message/result left on answering service. Pylori serology is negative All 3 polyps are tubular adenomas Next colonoscopy in 5 years. Please send all reports to PCP

## 2012-02-04 NOTE — Telephone Encounter (Signed)
Forward to Ann Tilley 

## 2012-02-04 NOTE — Telephone Encounter (Signed)
Noted Forwarded to Dewayne Hatch to make note of next TCS and forward results to PCP

## 2012-02-07 ENCOUNTER — Encounter (INDEPENDENT_AMBULATORY_CARE_PROVIDER_SITE_OTHER): Payer: Self-pay | Admitting: *Deleted

## 2012-02-07 NOTE — Telephone Encounter (Signed)
5 yr TCS noted, procedure note and pathology result letter faxed to PCP  

## 2012-02-19 ENCOUNTER — Ambulatory Visit: Payer: Self-pay | Admitting: Ophthalmology

## 2012-02-19 DIAGNOSIS — H2510 Age-related nuclear cataract, unspecified eye: Secondary | ICD-10-CM | POA: Diagnosis not present

## 2012-02-19 DIAGNOSIS — Z01810 Encounter for preprocedural cardiovascular examination: Secondary | ICD-10-CM | POA: Diagnosis not present

## 2012-02-19 DIAGNOSIS — Z01812 Encounter for preprocedural laboratory examination: Secondary | ICD-10-CM | POA: Diagnosis not present

## 2012-02-19 DIAGNOSIS — I10 Essential (primary) hypertension: Secondary | ICD-10-CM | POA: Diagnosis not present

## 2012-02-19 LAB — POTASSIUM: Potassium: 4.1 mmol/L (ref 3.5–5.1)

## 2012-02-28 ENCOUNTER — Ambulatory Visit: Payer: Self-pay | Admitting: Ophthalmology

## 2012-02-28 DIAGNOSIS — H269 Unspecified cataract: Secondary | ICD-10-CM | POA: Diagnosis not present

## 2012-02-28 DIAGNOSIS — K219 Gastro-esophageal reflux disease without esophagitis: Secondary | ICD-10-CM | POA: Diagnosis not present

## 2012-02-28 DIAGNOSIS — E119 Type 2 diabetes mellitus without complications: Secondary | ICD-10-CM | POA: Diagnosis not present

## 2012-02-28 DIAGNOSIS — H9190 Unspecified hearing loss, unspecified ear: Secondary | ICD-10-CM | POA: Diagnosis not present

## 2012-02-28 DIAGNOSIS — E7800 Pure hypercholesterolemia, unspecified: Secondary | ICD-10-CM | POA: Diagnosis not present

## 2012-02-28 DIAGNOSIS — Z7982 Long term (current) use of aspirin: Secondary | ICD-10-CM | POA: Diagnosis not present

## 2012-02-28 DIAGNOSIS — Z79899 Other long term (current) drug therapy: Secondary | ICD-10-CM | POA: Diagnosis not present

## 2012-02-28 DIAGNOSIS — H2510 Age-related nuclear cataract, unspecified eye: Secondary | ICD-10-CM | POA: Diagnosis not present

## 2012-02-28 DIAGNOSIS — I2510 Atherosclerotic heart disease of native coronary artery without angina pectoris: Secondary | ICD-10-CM | POA: Diagnosis not present

## 2012-02-28 DIAGNOSIS — I252 Old myocardial infarction: Secondary | ICD-10-CM | POA: Diagnosis not present

## 2012-02-28 DIAGNOSIS — Z951 Presence of aortocoronary bypass graft: Secondary | ICD-10-CM | POA: Diagnosis not present

## 2012-03-31 DIAGNOSIS — I119 Hypertensive heart disease without heart failure: Secondary | ICD-10-CM | POA: Diagnosis not present

## 2012-03-31 DIAGNOSIS — E119 Type 2 diabetes mellitus without complications: Secondary | ICD-10-CM | POA: Diagnosis not present

## 2012-03-31 DIAGNOSIS — E109 Type 1 diabetes mellitus without complications: Secondary | ICD-10-CM | POA: Diagnosis not present

## 2012-03-31 DIAGNOSIS — E7800 Pure hypercholesterolemia, unspecified: Secondary | ICD-10-CM | POA: Diagnosis not present

## 2012-03-31 DIAGNOSIS — N200 Calculus of kidney: Secondary | ICD-10-CM | POA: Diagnosis not present

## 2012-03-31 DIAGNOSIS — E785 Hyperlipidemia, unspecified: Secondary | ICD-10-CM | POA: Diagnosis not present

## 2012-03-31 DIAGNOSIS — N529 Male erectile dysfunction, unspecified: Secondary | ICD-10-CM | POA: Diagnosis not present

## 2012-03-31 DIAGNOSIS — K219 Gastro-esophageal reflux disease without esophagitis: Secondary | ICD-10-CM | POA: Diagnosis not present

## 2012-03-31 DIAGNOSIS — Z79899 Other long term (current) drug therapy: Secondary | ICD-10-CM | POA: Diagnosis not present

## 2012-05-02 ENCOUNTER — Other Ambulatory Visit: Payer: Self-pay

## 2012-05-14 ENCOUNTER — Encounter: Payer: Self-pay | Admitting: Cardiology

## 2012-05-29 DIAGNOSIS — IMO0001 Reserved for inherently not codable concepts without codable children: Secondary | ICD-10-CM | POA: Diagnosis not present

## 2012-05-29 DIAGNOSIS — E782 Mixed hyperlipidemia: Secondary | ICD-10-CM | POA: Diagnosis not present

## 2012-05-29 DIAGNOSIS — I119 Hypertensive heart disease without heart failure: Secondary | ICD-10-CM | POA: Diagnosis not present

## 2012-05-29 DIAGNOSIS — I252 Old myocardial infarction: Secondary | ICD-10-CM | POA: Diagnosis not present

## 2012-06-11 ENCOUNTER — Encounter: Payer: Self-pay | Admitting: Cardiovascular Disease

## 2012-07-20 DIAGNOSIS — E1139 Type 2 diabetes mellitus with other diabetic ophthalmic complication: Secondary | ICD-10-CM | POA: Diagnosis not present

## 2012-07-20 DIAGNOSIS — E11359 Type 2 diabetes mellitus with proliferative diabetic retinopathy without macular edema: Secondary | ICD-10-CM | POA: Diagnosis not present

## 2012-10-21 ENCOUNTER — Other Ambulatory Visit: Payer: Self-pay

## 2012-11-27 DIAGNOSIS — Z23 Encounter for immunization: Secondary | ICD-10-CM | POA: Diagnosis not present

## 2012-12-08 DIAGNOSIS — H2510 Age-related nuclear cataract, unspecified eye: Secondary | ICD-10-CM | POA: Diagnosis not present

## 2012-12-14 ENCOUNTER — Ambulatory Visit: Payer: Medicare Other | Admitting: Cardiovascular Disease

## 2012-12-15 ENCOUNTER — Other Ambulatory Visit: Payer: Self-pay | Admitting: Cardiovascular Disease

## 2012-12-15 DIAGNOSIS — E119 Type 2 diabetes mellitus without complications: Secondary | ICD-10-CM | POA: Diagnosis not present

## 2012-12-15 DIAGNOSIS — E782 Mixed hyperlipidemia: Secondary | ICD-10-CM | POA: Diagnosis not present

## 2012-12-15 DIAGNOSIS — Z79899 Other long term (current) drug therapy: Secondary | ICD-10-CM | POA: Diagnosis not present

## 2012-12-15 DIAGNOSIS — R5381 Other malaise: Secondary | ICD-10-CM | POA: Diagnosis not present

## 2012-12-15 LAB — CBC
HCT: 38.1 % — ABNORMAL LOW (ref 39.0–52.0)
MCHC: 33.6 g/dL (ref 30.0–36.0)
MCV: 83 fL (ref 78.0–100.0)
Platelets: 213 10*3/uL (ref 150–400)
RDW: 14.1 % (ref 11.5–15.5)
WBC: 6.5 10*3/uL (ref 4.0–10.5)

## 2012-12-15 LAB — LIPID PANEL
HDL: 29 mg/dL — ABNORMAL LOW (ref >39)
LDL Cholesterol: 12 mg/dL (ref 0–99)
Total CHOL/HDL Ratio: 3 Ratio
VLDL: 45 mg/dL — ABNORMAL HIGH (ref 0–40)

## 2012-12-15 LAB — COMPREHENSIVE METABOLIC PANEL
ALT: 18 U/L (ref 0–53)
AST: 18 U/L (ref 0–37)
Alkaline Phosphatase: 46 U/L (ref 39–117)
BUN: 27 mg/dL — ABNORMAL HIGH (ref 6–23)
Chloride: 107 mEq/L (ref 96–112)
Creat: 1.99 mg/dL — ABNORMAL HIGH (ref 0.50–1.35)
Total Bilirubin: 0.4 mg/dL (ref 0.3–1.2)

## 2012-12-15 LAB — TSH: TSH: 1.189 u[IU]/mL (ref 0.350–4.500)

## 2012-12-15 LAB — HEMOGLOBIN A1C: Hgb A1c MFr Bld: 9.9 % — ABNORMAL HIGH (ref <5.7)

## 2012-12-18 ENCOUNTER — Encounter: Payer: Self-pay | Admitting: Cardiovascular Disease

## 2012-12-18 ENCOUNTER — Ambulatory Visit (INDEPENDENT_AMBULATORY_CARE_PROVIDER_SITE_OTHER): Payer: Medicare Other | Admitting: Cardiovascular Disease

## 2012-12-18 VITALS — BP 102/62 | HR 49 | Ht 67.0 in | Wt 201.2 lb

## 2012-12-18 DIAGNOSIS — K219 Gastro-esophageal reflux disease without esophagitis: Secondary | ICD-10-CM

## 2012-12-18 DIAGNOSIS — E119 Type 2 diabetes mellitus without complications: Secondary | ICD-10-CM | POA: Diagnosis not present

## 2012-12-18 DIAGNOSIS — I2510 Atherosclerotic heart disease of native coronary artery without angina pectoris: Secondary | ICD-10-CM

## 2012-12-18 DIAGNOSIS — E785 Hyperlipidemia, unspecified: Secondary | ICD-10-CM

## 2012-12-18 DIAGNOSIS — R001 Bradycardia, unspecified: Secondary | ICD-10-CM

## 2012-12-18 DIAGNOSIS — I498 Other specified cardiac arrhythmias: Secondary | ICD-10-CM

## 2012-12-18 DIAGNOSIS — I4510 Unspecified right bundle-branch block: Secondary | ICD-10-CM | POA: Insufficient documentation

## 2012-12-18 NOTE — Progress Notes (Signed)
Patient ID: OUSMAN DISE, male   DOB: [birth year], 72 y.o.   MRN: [id number]      HPI: MAHMOOD BOEHRINGER, is a 72 y.o. male who presents for six-month cardiology evaluation.  Mr. Kitner has established coronary artery disease and in July 1997 suffered an anterior wall myocardial infarction and underwent PTCA/stenting of his LAD. Repeat intervention was done in 1998 due to in-stent restenosis. At that time he also had moderate disease in his circumflex and right coronary artery which medical therapy has been recommended. His last nuclear perfusion study was done in August 2013 which showed old scar in the apical septal and apical region. Ejection fraction was 57%. This study was unchanged from 2 years previously.  Mr. Becvar denies recent episodes of chest pain or shortness of breath. He does have additional history of type 2 diabetes mellitus which recently has not been well controlled. Laboratory had shown a recent hemoglobin A1c of 9.9 with a mean plasma glucose estimate at 237. Os is a history of obesity, mild hyperlipidemia, as well as hypertension. He is followed by Dr. Selinda Flavin who is managing his diabetes. Patient states over the last 3 weeks he has made a major impact in trying to change his dietary habits.  Past Medical History  Diagnosis Date  . Coronary artery disease     Coronary stent 1997, Last cath 1998, Last Nuc 10/24/2011-no change from previous, Last Echo 2011 EF 45%  . Diabetes mellitus     medical treatment x 10 years  . Chronic kidney disease     [phone] kidney stones  . Myocardial infarction 1997    acute Ant MI  . Hyperlipidemia     lipomet analysis LDL particle # at 786  . Nephrolithiasis 11/2011    Past Surgical History  Procedure Laterality Date  . Back surgery  1998  . Stent  1998    coronary stent  . Colonoscopy  2004    By Dr[facility]Rehman at [facility]  . Colonoscopy with esophagogastroduodenoscopy (egd)  01/24/2012    Procedure: COLONOSCOPY WITH  ESOPHAGOGASTRODUODENOSCOPY (EGD);  Surgeon: Malissa Hippo, MD;  Location: [facility];  Service: Endoscopy;  Laterality: N/A;  955  . Balloon dilation  01/24/2012    Procedure: BALLOON DILATION;  Surgeon: Malissa Hippo, MD;  Location: [facility];  Service: Endoscopy;  Laterality: N/A;  Elease Hashimoto dilation  01/24/2012    Procedure: Elease Hashimoto DILATION;  Surgeon: Malissa Hippo, MD;  Location: [facility];  Service: Endoscopy;  Laterality: N/A;  . Savory dilation  01/24/2012    Procedure: SAVORY DILATION;  Surgeon: Malissa Hippo, MD;  Location: [facility];  Service: Endoscopy;  Laterality: N/A;  . Coronary stent placement  09/15/1995    PTCA/Stent Palmaz-schatz stent to prox. LAD for ant MI  . Coronary angioplasty  06/08/1996    PTCA to prox. LAD for in-stent restenosis, residual disease of 30-50% and 40-50% narrowings in his RCA and ectasia of LCX.    No Known Allergies  Current Outpatient Prescriptions  Medication Sig Dispense Refill  . aspirin 81 MG tablet Take 81 mg by mouth daily.      Marland Kitchen glipiZIDE (GLUCOTROL) 5 MG tablet Take 5 mg by mouth daily.      Marland Kitchen lisinopril (PRINIVIL,ZESTRIL) 10 MG tablet Take 15 mg by mouth daily.      . metFORMIN (GLUCOPHAGE) 500 MG tablet Take 1,000 mg by mouth 2 (two) times daily with a meal.       .  metoprolol tartrate (LOPRESSOR) 25 MG tablet Take 25 mg by mouth 2 (two) times daily.      . Omega-3 Fatty Acids (FISH OIL) 1200 MG CAPS Take 1 capsule by mouth daily.      Marland Kitchen omeprazole (PRILOSEC) 20 MG capsule Take 1 capsule by mouth Daily.      . potassium citrate (UROCIT-K) 10 MEQ (1080 MG) SR tablet Take 10 mEq by mouth 2 (two) times daily.      . rosuvastatin (CRESTOR) 40 MG tablet Take 40 mg by mouth daily.       No current facility-administered medications for this visit.    History   Social History  . Marital Status: Married    Spouse Name: Alona Bene    Number of Children: 2  . Years of Education: N/A   Occupational History  . retired  Scientist, product/process development    Social History Main Topics  . Smoking status: Current Some Day Smoker    Types: Pipe  . Smokeless tobacco: Never Used     Comment: patient's states t hat he smokes his pipe in the summer time only  . Alcohol Use: No  . Drug Use: No  . Sexual Activity: Not on file   Other Topics Concern  . Not on file   Social History Narrative  . No narrative on file    Family History  Problem Relation Age of Onset  . Heart disease Mother   . Heart disease Father   . Healthy Son   . Healthy Son    Social history is notable that he is married has 2 children. He does not routinely exercise. There is no tobacco or alcohol use.   ROS is negative for fevers, chills or night sweats. He denies significant weight change. He denies visual symptoms. He does have a history of prior cataract surgery. He denies palpitations. He denies dizziness. He denies wheezing. Denies chest pressure. He denies abdominal complaints. He denies nausea vomiting or diarrhea. There is no change in bowel bladder habits. He denies edema. He denies myalgias. His diabetes is not well controlled. He denies thyroid issues. He denies claudication.   Other system review is negative.  PE BP 102/62  Pulse 49  Ht 5\' 7"  (1.702 m)  Wt 201 lb 3.2 oz (91.264 kg)  BMI 31.51 kg/m2  General: Alert, oriented, no distress.  Skin: normal turgor, no rashes HEENT: Normocephalic, atraumatic. Pupils round and reactive; sclera anicteric;no lid lag.  Nose without nasal septal hypertrophy Mouth/Parynx benign; Mallinpatti scale 3 Neck: No JVD, no carotid briuts Lungs: clear to ausculatation and percussion; no wheezing or rales Heart: RRR, s1 s2 normal 1/6 systolic murmur Abdomen: Moderately large diastases recti; soft, nontender; no hepatosplenomehaly, BS+; abdominal aorta nontender and not dilated by palpation. Pulses 2+ Extremities: no clubbing cyanosis or edema, Homan's sign negative  Neurologic: grossly  nonfocal Psychological: Normal affect and mood  ECG: Sinus bradycardia with right bundle branch block. Small Q waves in leads V1 and V2 concord with septal infarct that is old. Nonspecific T changes laterally.  LABS:  BMET    Component Value Date/Time   NA 138 12/15/2012 0932   K 4.5 12/15/2012 0932   CL 107 12/15/2012 0932   CO2 22 12/15/2012 0932   GLUCOSE 155* 12/15/2012 0932   BUN 27* 12/15/2012 0932   CREATININE 1.99* 12/15/2012 0932   CALCIUM 9.3 12/15/2012 0932     Hepatic Function Panel     Component Value Date/Time   PROT 6.3 12/15/2012  0932   ALBUMIN 4.0 12/15/2012 0932   AST 18 12/15/2012 0932   ALT 18 12/15/2012 0932   ALKPHOS 46 12/15/2012 0932   BILITOT 0.4 12/15/2012 0932     CBC    Component Value Date/Time   WBC 6.5 12/15/2012 0932   RBC 4.59 12/15/2012 0932   HGB 12.8* 12/15/2012 0932   HCT 38.1* 12/15/2012 0932   PLT 213 12/15/2012 0932   MCV 83.0 12/15/2012 0932   MCH 27.9 12/15/2012 0932   MCHC 33.6 12/15/2012 0932   RDW 14.1 12/15/2012 0932     BNP No results found for this basename: probnp    Lipid Panel     Component Value Date/Time   CHOL 86 12/15/2012 0932   TRIG 225* 12/15/2012 0932   HDL 29* 12/15/2012 0932   CHOLHDL 3.0 12/15/2012 0932   VLDL 45* 12/15/2012 0932   LDLCALC 12 12/15/2012 0932     RADIOLOGY: No results found.    ASSESSMENT AND PLAN: Mr. Jhovany Weidinger is status post an anterior wall myocardial infarction in 1997 for which he underwent intervention to his LAD. He did have restenosis in 1998. He has concomitant CAD and has been on medical therapy. His last nuclear perfusion study in August 2013 was unchanged from 2011 and only showed a very small region of abnormality in the apical septal region. He presently is bradycardic and his blood pressure is somewhat low. I am reducing his metoprolol tartrate from 25 twice a day to 12.5 twice a day. I did stress the importance of more aggressive control of his diabetes mellitus. He may require  additional therapy. Dr. Dimas Aguas is evaluating him for this. He tells me he will have repeat laboratories in 3 weeks per Dr. Jeannette How recommendations. As long as he remains stable I will see him in 6 months for cardiology reevaluation.     Lennette Bihari, MD, [facility]  12/18/2012 11:42 AM

## 2012-12-18 NOTE — Progress Notes (Signed)
Quick Note:  Released into my chart. Also discussed at 12/18/12 appointment. ______

## 2012-12-18 NOTE — Patient Instructions (Signed)
Your physician has recommended you make the following change in your medication: decrease the metoprolol down to 1/2 twice daily.  Your physician recommends that you schedule a follow-up appointment in: 6 months.

## 2012-12-21 ENCOUNTER — Encounter: Payer: Self-pay | Admitting: Cardiovascular Disease

## 2012-12-23 NOTE — Progress Notes (Signed)
Quick Note:  Results were discussed at 10/3 appointment. ______

## 2012-12-23 NOTE — Progress Notes (Signed)
Quick Note:  Results were discussed at 10/3 appointment. ______ 

## 2012-12-28 DIAGNOSIS — H2510 Age-related nuclear cataract, unspecified eye: Secondary | ICD-10-CM | POA: Diagnosis not present

## 2012-12-29 ENCOUNTER — Telehealth: Payer: Self-pay | Admitting: *Deleted

## 2012-12-29 DIAGNOSIS — Z79899 Other long term (current) drug therapy: Secondary | ICD-10-CM

## 2012-12-29 NOTE — Telephone Encounter (Signed)
Called patient and gave la results and recommendations. Informed him results with message has been released into my chart if he would ike to review the numbers.

## 2012-12-29 NOTE — Progress Notes (Signed)
Quick Note:  Results released into my chart. Lab order put into the [facility] system. ______

## 2012-12-29 NOTE — Telephone Encounter (Signed)
Message copied by Gaynelle Cage on Tue Dec 29, 2012  5:17 PM ------      Message from: Nicki Guadalajara A      Created: Mon Dec 28, 2012  5:07 PM       Cut lisinopril in half; re-check bmet in 1 week ------

## 2013-01-06 DIAGNOSIS — Z79899 Other long term (current) drug therapy: Secondary | ICD-10-CM | POA: Diagnosis not present

## 2013-01-07 LAB — BASIC METABOLIC PANEL
BUN: 29 mg/dL — ABNORMAL HIGH (ref 6–23)
Creat: 2.12 mg/dL — ABNORMAL HIGH (ref 0.50–1.35)
Glucose, Bld: 263 mg/dL — ABNORMAL HIGH (ref 70–99)
Potassium: 4.9 mEq/L (ref 3.5–5.3)

## 2013-01-11 ENCOUNTER — Ambulatory Visit: Payer: Self-pay | Admitting: Ophthalmology

## 2013-01-11 DIAGNOSIS — E119 Type 2 diabetes mellitus without complications: Secondary | ICD-10-CM | POA: Diagnosis not present

## 2013-01-11 DIAGNOSIS — Z7982 Long term (current) use of aspirin: Secondary | ICD-10-CM | POA: Diagnosis not present

## 2013-01-11 DIAGNOSIS — Z79899 Other long term (current) drug therapy: Secondary | ICD-10-CM | POA: Diagnosis not present

## 2013-01-11 DIAGNOSIS — H9190 Unspecified hearing loss, unspecified ear: Secondary | ICD-10-CM | POA: Diagnosis not present

## 2013-01-11 DIAGNOSIS — H2510 Age-related nuclear cataract, unspecified eye: Secondary | ICD-10-CM | POA: Diagnosis not present

## 2013-01-11 DIAGNOSIS — H269 Unspecified cataract: Secondary | ICD-10-CM | POA: Diagnosis not present

## 2013-01-11 DIAGNOSIS — I252 Old myocardial infarction: Secondary | ICD-10-CM | POA: Diagnosis not present

## 2013-01-11 DIAGNOSIS — I2510 Atherosclerotic heart disease of native coronary artery without angina pectoris: Secondary | ICD-10-CM | POA: Diagnosis not present

## 2013-01-11 DIAGNOSIS — K219 Gastro-esophageal reflux disease without esophagitis: Secondary | ICD-10-CM | POA: Diagnosis not present

## 2013-01-11 DIAGNOSIS — Z888 Allergy status to other drugs, medicaments and biological substances status: Secondary | ICD-10-CM | POA: Diagnosis not present

## 2013-01-11 DIAGNOSIS — E7800 Pure hypercholesterolemia, unspecified: Secondary | ICD-10-CM | POA: Diagnosis not present

## 2013-01-11 DIAGNOSIS — Z882 Allergy status to sulfonamides status: Secondary | ICD-10-CM | POA: Diagnosis not present

## 2013-01-11 DIAGNOSIS — F17200 Nicotine dependence, unspecified, uncomplicated: Secondary | ICD-10-CM | POA: Diagnosis not present

## 2013-01-18 DIAGNOSIS — E11359 Type 2 diabetes mellitus with proliferative diabetic retinopathy without macular edema: Secondary | ICD-10-CM | POA: Diagnosis not present

## 2013-01-21 ENCOUNTER — Other Ambulatory Visit: Payer: Self-pay

## 2013-01-21 DIAGNOSIS — E119 Type 2 diabetes mellitus without complications: Secondary | ICD-10-CM | POA: Diagnosis not present

## 2013-01-21 DIAGNOSIS — E7800 Pure hypercholesterolemia, unspecified: Secondary | ICD-10-CM | POA: Diagnosis not present

## 2013-01-21 DIAGNOSIS — K219 Gastro-esophageal reflux disease without esophagitis: Secondary | ICD-10-CM | POA: Diagnosis not present

## 2013-01-28 DIAGNOSIS — N200 Calculus of kidney: Secondary | ICD-10-CM | POA: Diagnosis not present

## 2013-01-28 DIAGNOSIS — N281 Cyst of kidney, acquired: Secondary | ICD-10-CM | POA: Diagnosis not present

## 2013-01-28 DIAGNOSIS — Z125 Encounter for screening for malignant neoplasm of prostate: Secondary | ICD-10-CM | POA: Diagnosis not present

## 2013-01-29 DIAGNOSIS — Z0000 Encounter for general adult medical examination without abnormal findings: Secondary | ICD-10-CM | POA: Diagnosis not present

## 2013-01-29 DIAGNOSIS — IMO0001 Reserved for inherently not codable concepts without codable children: Secondary | ICD-10-CM | POA: Diagnosis not present

## 2013-01-29 DIAGNOSIS — K219 Gastro-esophageal reflux disease without esophagitis: Secondary | ICD-10-CM | POA: Diagnosis not present

## 2013-01-29 DIAGNOSIS — I119 Hypertensive heart disease without heart failure: Secondary | ICD-10-CM | POA: Diagnosis not present

## 2013-01-29 DIAGNOSIS — N189 Chronic kidney disease, unspecified: Secondary | ICD-10-CM | POA: Diagnosis not present

## 2013-01-29 DIAGNOSIS — E7800 Pure hypercholesterolemia, unspecified: Secondary | ICD-10-CM | POA: Diagnosis not present

## 2013-02-16 ENCOUNTER — Other Ambulatory Visit: Payer: Self-pay | Admitting: *Deleted

## 2013-02-16 ENCOUNTER — Telehealth: Payer: Self-pay | Admitting: Pharmacist Clinician (PhC)/ Clinical Pharmacy Specialist

## 2013-02-16 DIAGNOSIS — R7989 Other specified abnormal findings of blood chemistry: Secondary | ICD-10-CM

## 2013-02-16 DIAGNOSIS — Z79899 Other long term (current) drug therapy: Secondary | ICD-10-CM

## 2013-02-16 MED ORDER — LISINOPRIL 5 MG PO TABS: 5.0000 mg | ORAL_TABLET | Freq: Every day | ORAL | Status: DC

## 2013-02-16 NOTE — Telephone Encounter (Signed)
Decrease lisinopril down to 5 mg daily due to Elevated creat. Recheck BMET in 2 weeks. Call if questions. Lab order mailed to patient.

## 2013-02-16 NOTE — Telephone Encounter (Signed)
Reviewed with TK - pt SCr 1.99, on 15mg  lisinopril.  Will cut to 5mg  qd, repeat BMP in 2 weeks.  Forwarded to [employer]

## 2013-03-18 HISTORY — PX: CATARACT EXTRACTION W/ INTRAOCULAR LENS  IMPLANT, BILATERAL: SHX1307

## 2013-03-30 DIAGNOSIS — J019 Acute sinusitis, unspecified: Secondary | ICD-10-CM | POA: Diagnosis not present

## 2013-03-30 DIAGNOSIS — J45901 Unspecified asthma with (acute) exacerbation: Secondary | ICD-10-CM | POA: Diagnosis not present

## 2013-04-12 DIAGNOSIS — R059 Cough, unspecified: Secondary | ICD-10-CM | POA: Diagnosis not present

## 2013-04-12 DIAGNOSIS — H9209 Otalgia, unspecified ear: Secondary | ICD-10-CM | POA: Diagnosis not present

## 2013-04-12 DIAGNOSIS — J40 Bronchitis, not specified as acute or chronic: Secondary | ICD-10-CM | POA: Diagnosis not present

## 2013-04-12 DIAGNOSIS — R05 Cough: Secondary | ICD-10-CM | POA: Diagnosis not present

## 2013-06-22 DIAGNOSIS — L570 Actinic keratosis: Secondary | ICD-10-CM | POA: Diagnosis not present

## 2013-06-22 DIAGNOSIS — D0460 Carcinoma in situ of skin of unspecified upper limb, including shoulder: Secondary | ICD-10-CM | POA: Diagnosis not present

## 2013-06-22 DIAGNOSIS — D485 Neoplasm of uncertain behavior of skin: Secondary | ICD-10-CM | POA: Diagnosis not present

## 2013-06-22 DIAGNOSIS — D0430 Carcinoma in situ of skin of unspecified part of face: Secondary | ICD-10-CM | POA: Diagnosis not present

## 2013-06-22 DIAGNOSIS — Z85828 Personal history of other malignant neoplasm of skin: Secondary | ICD-10-CM | POA: Diagnosis not present

## 2013-06-22 DIAGNOSIS — D0439 Carcinoma in situ of skin of other parts of face: Secondary | ICD-10-CM | POA: Diagnosis not present

## 2013-06-22 DIAGNOSIS — C44519 Basal cell carcinoma of skin of other part of trunk: Secondary | ICD-10-CM | POA: Diagnosis not present

## 2013-06-28 DIAGNOSIS — K219 Gastro-esophageal reflux disease without esophagitis: Secondary | ICD-10-CM | POA: Diagnosis not present

## 2013-06-28 DIAGNOSIS — IMO0001 Reserved for inherently not codable concepts without codable children: Secondary | ICD-10-CM | POA: Diagnosis not present

## 2013-06-28 DIAGNOSIS — E7800 Pure hypercholesterolemia, unspecified: Secondary | ICD-10-CM | POA: Diagnosis not present

## 2013-06-28 DIAGNOSIS — N189 Chronic kidney disease, unspecified: Secondary | ICD-10-CM | POA: Diagnosis not present

## 2013-07-01 DIAGNOSIS — D485 Neoplasm of uncertain behavior of skin: Secondary | ICD-10-CM | POA: Diagnosis not present

## 2013-07-05 DIAGNOSIS — Z23 Encounter for immunization: Secondary | ICD-10-CM | POA: Diagnosis not present

## 2013-07-05 DIAGNOSIS — N189 Chronic kidney disease, unspecified: Secondary | ICD-10-CM | POA: Diagnosis not present

## 2013-07-05 DIAGNOSIS — IMO0001 Reserved for inherently not codable concepts without codable children: Secondary | ICD-10-CM | POA: Diagnosis not present

## 2013-07-05 DIAGNOSIS — N529 Male erectile dysfunction, unspecified: Secondary | ICD-10-CM | POA: Diagnosis not present

## 2013-07-05 DIAGNOSIS — I119 Hypertensive heart disease without heart failure: Secondary | ICD-10-CM | POA: Diagnosis not present

## 2013-07-15 DIAGNOSIS — D485 Neoplasm of uncertain behavior of skin: Secondary | ICD-10-CM | POA: Diagnosis not present

## 2013-07-15 DIAGNOSIS — C44519 Basal cell carcinoma of skin of other part of trunk: Secondary | ICD-10-CM | POA: Diagnosis not present

## 2013-07-15 DIAGNOSIS — C44621 Squamous cell carcinoma of skin of unspecified upper limb, including shoulder: Secondary | ICD-10-CM | POA: Diagnosis not present

## 2013-07-15 DIAGNOSIS — C44320 Squamous cell carcinoma of skin of unspecified parts of face: Secondary | ICD-10-CM | POA: Diagnosis not present

## 2013-07-22 DIAGNOSIS — C44519 Basal cell carcinoma of skin of other part of trunk: Secondary | ICD-10-CM | POA: Diagnosis not present

## 2013-07-24 DIAGNOSIS — R509 Fever, unspecified: Secondary | ICD-10-CM | POA: Diagnosis not present

## 2013-07-28 DIAGNOSIS — N281 Cyst of kidney, acquired: Secondary | ICD-10-CM | POA: Diagnosis not present

## 2013-07-28 DIAGNOSIS — N200 Calculus of kidney: Secondary | ICD-10-CM | POA: Diagnosis not present

## 2013-09-03 ENCOUNTER — Telehealth: Payer: Self-pay | Admitting: Cardiovascular Disease

## 2013-09-03 DIAGNOSIS — E139 Other specified diabetes mellitus without complications: Secondary | ICD-10-CM

## 2013-09-03 DIAGNOSIS — E785 Hyperlipidemia, unspecified: Secondary | ICD-10-CM

## 2013-09-03 DIAGNOSIS — R5383 Other fatigue: Secondary | ICD-10-CM

## 2013-09-03 DIAGNOSIS — Z79899 Other long term (current) drug therapy: Secondary | ICD-10-CM

## 2013-09-03 NOTE — Telephone Encounter (Signed)
Pt has appt September 24 with tk. Wants to know if he needs labwork and if so, he needs a labslip.

## 2013-09-03 NOTE — Telephone Encounter (Signed)
Patient notified to have fasting labs prior to office visit with Dr. Claiborne Billings. Will use [facility] in [place].

## 2013-09-21 DIAGNOSIS — H35379 Puckering of macula, unspecified eye: Secondary | ICD-10-CM | POA: Diagnosis not present

## 2013-11-30 DIAGNOSIS — R5381 Other malaise: Secondary | ICD-10-CM | POA: Diagnosis not present

## 2013-11-30 DIAGNOSIS — E785 Hyperlipidemia, unspecified: Secondary | ICD-10-CM | POA: Diagnosis not present

## 2013-11-30 DIAGNOSIS — Z79899 Other long term (current) drug therapy: Secondary | ICD-10-CM | POA: Diagnosis not present

## 2013-11-30 DIAGNOSIS — E119 Type 2 diabetes mellitus without complications: Secondary | ICD-10-CM | POA: Diagnosis not present

## 2013-11-30 DIAGNOSIS — R5383 Other fatigue: Secondary | ICD-10-CM | POA: Diagnosis not present

## 2013-11-30 LAB — LIPID PANEL
CHOL/HDL RATIO: 3.1 ratio
CHOLESTEROL: 101 mg/dL (ref 0–200)
HDL: 33 mg/dL — ABNORMAL LOW (ref >39)
LDL Cholesterol: 41 mg/dL (ref 0–99)
Triglycerides: 137 mg/dL (ref <150)
VLDL: 27 mg/dL (ref 0–40)

## 2013-11-30 LAB — HEMOGLOBIN A1C
HEMOGLOBIN A1C: 12.8 % — AB (ref <5.7)
MEAN PLASMA GLUCOSE: 321 mg/dL — AB (ref <117)

## 2013-11-30 LAB — CBC
HEMATOCRIT: 42.1 % (ref 39.0–52.0)
HEMOGLOBIN: 14.1 g/dL (ref 13.0–17.0)
MCH: 27.1 pg (ref 26.0–34.0)
MCHC: 33.5 g/dL (ref 30.0–36.0)
MCV: 80.8 fL (ref 78.0–100.0)
Platelets: 177 10*3/uL (ref 150–400)
RBC: 5.21 MIL/uL (ref 4.22–5.81)
RDW: 15 % (ref 11.5–15.5)
WBC: 7 10*3/uL (ref 4.0–10.5)

## 2013-11-30 LAB — COMPREHENSIVE METABOLIC PANEL
ALBUMIN: 3.9 g/dL (ref 3.5–5.2)
ALK PHOS: 59 U/L (ref 39–117)
ALT: 24 U/L (ref 0–53)
AST: 18 U/L (ref 0–37)
BUN: 27 mg/dL — AB (ref 6–23)
CO2: 22 mEq/L (ref 19–32)
Calcium: 9.1 mg/dL (ref 8.4–10.5)
Chloride: 105 mEq/L (ref 96–112)
Creat: 2.19 mg/dL — ABNORMAL HIGH (ref 0.50–1.35)
GLUCOSE: 299 mg/dL — AB (ref 70–99)
POTASSIUM: 4.4 meq/L (ref 3.5–5.3)
Sodium: 137 mEq/L (ref 135–145)
Total Bilirubin: 0.5 mg/dL (ref 0.2–1.2)
Total Protein: 6.2 g/dL (ref 6.0–8.3)

## 2013-11-30 LAB — TSH: TSH: 1.153 u[IU]/mL (ref 0.350–4.500)

## 2013-12-02 ENCOUNTER — Telehealth: Payer: Self-pay | Admitting: *Deleted

## 2013-12-02 DIAGNOSIS — R899 Unspecified abnormal finding in specimens from other organs, systems and tissues: Secondary | ICD-10-CM

## 2013-12-02 NOTE — Telephone Encounter (Signed)
Called and notified patient of lab results and recommendations. Copy of labs faxed to Dr. Raliegh Ip. Howard. B-met ordered.

## 2013-12-02 NOTE — Telephone Encounter (Signed)
Message copied by Lauralee Evener on Thu Dec 02, 2013  9:54 AM ------      Message from: Shelva Majestic A      Created: Thu Dec 02, 2013  8:48 AM       Glu and HbA1c significantly elevated. DM is managed by Dr Rory Percy; forward labs to him for improved Diabetic control. Cr 2.19 Decrease lisinopril to 5 mg; f/u bmet in 2 weeks ------

## 2013-12-06 DIAGNOSIS — N390 Urinary tract infection, site not specified: Secondary | ICD-10-CM | POA: Diagnosis not present

## 2013-12-06 DIAGNOSIS — N476 Balanoposthitis: Secondary | ICD-10-CM | POA: Diagnosis not present

## 2013-12-09 ENCOUNTER — Ambulatory Visit: Payer: Medicare Other | Admitting: Cardiovascular Disease

## 2013-12-23 DIAGNOSIS — I119 Hypertensive heart disease without heart failure: Secondary | ICD-10-CM | POA: Diagnosis not present

## 2013-12-23 DIAGNOSIS — E119 Type 2 diabetes mellitus without complications: Secondary | ICD-10-CM | POA: Diagnosis not present

## 2013-12-27 DIAGNOSIS — C44719 Basal cell carcinoma of skin of left lower limb, including hip: Secondary | ICD-10-CM | POA: Diagnosis not present

## 2013-12-27 DIAGNOSIS — L570 Actinic keratosis: Secondary | ICD-10-CM | POA: Diagnosis not present

## 2013-12-27 DIAGNOSIS — Z85828 Personal history of other malignant neoplasm of skin: Secondary | ICD-10-CM | POA: Diagnosis not present

## 2013-12-27 DIAGNOSIS — D485 Neoplasm of uncertain behavior of skin: Secondary | ICD-10-CM | POA: Diagnosis not present

## 2013-12-30 DIAGNOSIS — Z23 Encounter for immunization: Secondary | ICD-10-CM | POA: Diagnosis not present

## 2013-12-30 DIAGNOSIS — N189 Chronic kidney disease, unspecified: Secondary | ICD-10-CM | POA: Diagnosis not present

## 2013-12-30 DIAGNOSIS — I119 Hypertensive heart disease without heart failure: Secondary | ICD-10-CM | POA: Diagnosis not present

## 2013-12-31 ENCOUNTER — Other Ambulatory Visit: Payer: Self-pay

## 2014-01-10 DIAGNOSIS — N481 Balanitis: Secondary | ICD-10-CM | POA: Diagnosis not present

## 2014-01-10 DIAGNOSIS — D495 Neoplasm of unspecified behavior of other genitourinary organs: Secondary | ICD-10-CM | POA: Diagnosis not present

## 2014-01-18 ENCOUNTER — Ambulatory Visit (INDEPENDENT_AMBULATORY_CARE_PROVIDER_SITE_OTHER): Payer: Medicare Other | Admitting: Cardiovascular Disease

## 2014-01-18 ENCOUNTER — Encounter: Payer: Self-pay | Admitting: Cardiovascular Disease

## 2014-01-18 VITALS — BP 110/60 | HR 44 | Ht 66.0 in | Wt 191.0 lb

## 2014-01-18 DIAGNOSIS — K219 Gastro-esophageal reflux disease without esophagitis: Secondary | ICD-10-CM

## 2014-01-18 DIAGNOSIS — R001 Bradycardia, unspecified: Secondary | ICD-10-CM | POA: Diagnosis not present

## 2014-01-18 DIAGNOSIS — I4510 Unspecified right bundle-branch block: Secondary | ICD-10-CM | POA: Diagnosis not present

## 2014-01-18 DIAGNOSIS — I2510 Atherosclerotic heart disease of native coronary artery without angina pectoris: Secondary | ICD-10-CM | POA: Diagnosis not present

## 2014-01-18 DIAGNOSIS — E785 Hyperlipidemia, unspecified: Secondary | ICD-10-CM

## 2014-01-18 DIAGNOSIS — E1165 Type 2 diabetes mellitus with hyperglycemia: Secondary | ICD-10-CM | POA: Diagnosis not present

## 2014-01-18 MED ORDER — CARVEDILOL 3.125 MG PO TABS: 3.1250 mg | ORAL_TABLET | Freq: Two times a day (BID) | ORAL | Status: DC

## 2014-01-18 NOTE — Patient Instructions (Signed)
Your physician recommends that you schedule a follow-up appointment in: 3 MONTHS WITH DR Claiborne Billings  STOP METOPROLOL  START CARVEDILOL 3.125 MG ONE TABLET TWICE DAILY  Your physician has requested that you have an echocardiogram. Echocardiography is a painless test that uses sound waves to create images of your heart. It provides your doctor with information about the size and shape of your heart and how well your heart's chambers and valves are working. This procedure takes approximately one hour. There are no restrictions for this procedure.SCHEDULE PRIOR TO APPOINTMENT IN 3 MONTHS

## 2014-01-18 NOTE — Progress Notes (Signed)
Patient ID: Maurice Ross, male   DOB: 01-24-1941, 73 y.o.   MRN: [id number]      HPI: Maurice Ross, is a 73 y.o. male who presents for one-year cardiology evaluation.  Maurice Ross has established coronary artery disease and in July 1997 suffered an anterior wall myocardial infarction and underwent PTCA/stenting of his LAD. Repeat intervention was done in 1998 due to in-stent restenosis. At that time he also had moderate disease in his circumflex and right coronary artery which medical therapy has been recommended. His last nuclear perfusion study was done in August 2013 which showed old scar in the apical septal and apical region. Ejection fraction was 57%. This study was unchanged from 2 years previously.  He has history of type 2 diabetes mellitus which recently has not been well controlled.he has been followed by Dr. Nadara Mustard for this.  He, his hemoglobin A1c had risen to 12.6 on 12/23/2013.  He states he has tried Byetta, metformin, and the toes without success.  He has an appointment to see Dr. Redmond Pulling an endocrinologist in approximately one month.  He denies chest pain.  He denies palpitations.  He denies PND, orthopnea.  He has a history of hyperlipidemia and has been taking Crestor 40 mg for this. He takes lisinopril 5 mg and Lopressor 25 mg twice a day for hypertension in his CAD.  He also a history of GERD.       Past Medical History  Diagnosis Date  . Coronary artery disease     Coronary stent 1997, Last cath 1998, Last Nuc 10/24/2011-no change from previous, Last Echo 2011 EF 45%  . Diabetes mellitus     medical treatment x 10 years  . Chronic kidney disease     [phone] kidney stones  . Myocardial infarction 1997    acute Ant MI  . Hyperlipidemia     lipomet analysis LDL particle # at 786  . Nephrolithiasis 11/2011    Past Surgical History  Procedure Laterality Date  . Back surgery  1998  . Stent  1998    coronary stent  . Colonoscopy  2004    By Dr[facility]Rehman at [facility]   . Colonoscopy with esophagogastroduodenoscopy (egd)  01/24/2012    Procedure: COLONOSCOPY WITH ESOPHAGOGASTRODUODENOSCOPY (EGD);  Surgeon: Rogene Houston, MD;  [facility];  Service: Endoscopy;  Laterality: N/A;  955  . Balloon dilation  01/24/2012    Procedure: BALLOON DILATION;  Surgeon: Rogene Houston, MD;  [facility];  Service: Endoscopy;  Laterality: N/A;  Venia Minks dilation  01/24/2012    Procedure: Venia Minks DILATION;  Surgeon: Rogene Houston, MD;  [facility];  Service: Endoscopy;  Laterality: N/A;  . Savory dilation  01/24/2012    Procedure: SAVORY DILATION;  Surgeon: Rogene Houston, MD;  [facility];  Service: Endoscopy;  Laterality: N/A;  . Coronary stent placement  09/15/1995    PTCA/Stent Palmaz-schatz stent to prox. LAD for ant MI  . Coronary angioplasty  06/08/1996    PTCA to prox. LAD for in-stent restenosis, residual disease of 30-50% and 40-50% narrowings in his RCA and ectasia of LCX.    No Known Allergies  Current Outpatient Prescriptions  Medication Sig Dispense Refill  . aspirin 81 MG tablet Take 81 mg by mouth daily.    Marland Kitchen glipiZIDE (GLUCOTROL) 5 MG tablet Take 10 mg by mouth daily.     Marland Kitchen lisinopril (PRINIVIL,ZESTRIL) 5 MG tablet Take 1 tablet (5  mg total) by mouth daily. (Patient taking differently: Take 10 mg by mouth daily. ) 30 tablet 0  . metFORMIN (GLUCOPHAGE) 500 MG tablet Take 1,000 mg by mouth 2 (two) times daily with a meal.     . metoprolol tartrate (LOPRESSOR) 25 MG tablet Take 25 mg by mouth 2 (two) times daily.    . Omega-3 Fatty Acids (FISH OIL) 1200 MG CAPS Take 1 capsule by mouth daily.    Marland Kitchen omeprazole (PRILOSEC) 20 MG capsule Take 1 capsule by mouth Daily.    . pioglitazone (ACTOS) 15 MG tablet Take 1 tablet by mouth every morning.  2  . potassium citrate (UROCIT-K) 10 MEQ (1080 MG) SR tablet Take 10 mEq by mouth 2 (two) times daily.    . rosuvastatin (CRESTOR) 40 MG tablet Take 40 mg by mouth daily.       No current facility-administered medications for this visit.    History   Social History  . Marital Status: Married    Spouse Name: Maurice Ross    Number of Children: 2  . Years of Education: N/A   Occupational History  . retired Engineer, manufacturing systems    Social History Main Topics  . Smoking status: Never Smoker   . Smokeless tobacco: Never Used     Comment: patient's states t hat he smokes his pipe in the summer time only  . Alcohol Use: No  . Drug Use: No  . Sexual Activity: Not on file   Other Topics Concern  . Not on file   Social History Narrative    Family History  Problem Relation Age of Onset  . Heart disease Mother   . Heart disease Father   . Healthy Son   . Healthy Son    Social history is notable that he is married has 2 children. He does not routinely exercise. There is no tobacco or alcohol use.   ROS General: Negative; No fevers, chills, or night sweats;  HEENT: Negative; No changes in vision or hearing, sinus congestion, difficulty swallowing Pulmonary: Negative; No cough, wheezing, shortness of breath, hemoptysis Cardiovascular: Negative; No chest pain, presyncope, syncope, palpitations GI: positive for GERD; No nausea, vomiting, diarrhea, or abdominal pain GU: Negative; No dysuria, hematuria, or difficulty voiding Musculoskeletal: Negative; no myalgias, joint pain, or weakness Hematologic/Oncology: Negative; no easy bruising, bleeding Endocrine: positive for poorly controlled diabetes mellitus.  No thyroid abnormalities. Neuro: Negative; no changes in balance, headaches Skin: Negative; No rashes or skin lesions Psychiatric: Negative; No behavioral problems, depression Sleep: Negative; No snoring, daytime sleepiness, hypersomnolence, bruxism, restless legs, hypnogognic hallucinations, no cataplexy Other comprehensive 14 point system review is negative.   PE BP 110/60 mmHg  Pulse 44  Ht 5\' 6"  (1.676 m)  Wt 191 lb (86.637 kg)  BMI 30.84 kg/m2   General: Alert, oriented, no distress.  Skin: normal turgor, no rashes HEENT: Normocephalic, atraumatic. Pupils round and reactive; sclera anicteric;no lid lag.  Nose without nasal septal hypertrophy Mouth/Parynx benign; Mallinpatti scale 3 Neck: No JVD, no carotid bruits with normal carotid upstroke Lungs: clear to ausculatation and percussion; no wheezing or rales Chest wall: Nontender to palpation Heart: RRR, s1 s2 normal 1/6 systolic murmur; .  No diastolic murmur.  No S3 gallop.  No rub thrills or heaves Abdomen: Moderately large diastases recti; soft, nontender; no hepatosplenomehaly, BS+; abdominal aorta nontender and not dilated by palpation. Back: No CVA tenderness Pulses 2+ Extremities: no clubbing cyanosis or edema, Homan's sign negative  Neurologic: grossly nonfocal Psychological: Normal affect  and mood  ECG (independently read by me): Marked sinus bradycardia at 44 bpm.  Right bundle branch block.  Probable left anterior fascicular block. Small septal Q waves  October 2014 ECG: Sinus bradycardia with right bundle branch block. Small Q waves in leads V1 and V2 concord with septal infarct that is old. Nonspecific T changes laterally.  LABS:  BMET    Component Value Date/Time   NA 137 11/30/2013 0847   K 4.4 11/30/2013 0847   CL 105 11/30/2013 0847   CO2 22 11/30/2013 0847   GLUCOSE 299* 11/30/2013 0847   BUN 27* 11/30/2013 0847   CREATININE 2.19* 11/30/2013 0847   CALCIUM 9.1 11/30/2013 0847     Hepatic Function Panel     Component Value Date/Time   PROT 6.2 11/30/2013 0847   ALBUMIN 3.9 11/30/2013 0847   AST 18 11/30/2013 0847   ALT 24 11/30/2013 0847   ALKPHOS 59 11/30/2013 0847   BILITOT 0.5 11/30/2013 0847     CBC    Component Value Date/Time   WBC 7.0 11/30/2013 0847   RBC 5.21 11/30/2013 0847   HGB 14.1 11/30/2013 0847   HCT 42.1 11/30/2013 0847   PLT 177 11/30/2013 0847   MCV 80.8 11/30/2013 0847   MCH 27.1 11/30/2013 0847   MCHC 33.5  11/30/2013 0847   RDW 15.0 11/30/2013 0847     BNP No results found for: PROBNP  Lipid Panel     Component Value Date/Time   CHOL 101 11/30/2013 0847   TRIG 137 11/30/2013 0847   HDL 33* 11/30/2013 0847   CHOLHDL 3.1 11/30/2013 0847   VLDL 27 11/30/2013 0847   LDLCALC 41 11/30/2013 0847     RADIOLOGY: No results found.    ASSESSMENT AND PLAN: Maurice Ross is status post an anterior wall myocardial infarction in 1997 for which he underwent initial intervention to his LAD. He did have restenosis in 1998. He has concomitant CAD and has been on medical therapy. His last nuclear perfusion study in August 2013 was unchanged from 2011 and only showed a very small region of abnormality in the apical septal region. He is bradycardic today and has been taking metoprolol 25 mg twice a day.  Particular with his glucose intolerance, I am changing him from metoprolol and will start him on very low-dose carvedilol at 3.125 mg twice a day.  He has lost 10 pounds over the past year and I commended him on this.  He must have improved diabetes control.  We discussed continued exercise.  Reviewed his recent laboratory.  Lipid studies were good with a total cholesterol 101, triglycerides 137 and LDL cholesterol 41, but HDL remains somewhat low at 33.  I will see him in 3 months for follow up evaluation and prior to that office visit.  He will undergo an echo Doppler study.  Says systolic and diastolic function.   Troy Sine, MD, [facility]  01/18/2014 12:24 PM

## 2014-01-20 ENCOUNTER — Encounter: Payer: Self-pay | Admitting: Cardiovascular Disease

## 2014-01-20 DIAGNOSIS — C44711 Basal cell carcinoma of skin of unspecified lower limb, including hip: Secondary | ICD-10-CM | POA: Diagnosis not present

## 2014-01-20 DIAGNOSIS — E785 Hyperlipidemia, unspecified: Secondary | ICD-10-CM | POA: Insufficient documentation

## 2014-01-25 ENCOUNTER — Ambulatory Visit (HOSPITAL_COMMUNITY)
Admission: RE | Admit: 2014-01-25 | Discharge: 2014-01-25 | Disposition: A | Discharge status: Home / Self Care | Payer: Medicare Other | Source: Ambulatory Visit | Attending: Cardiovascular Disease | Admitting: Cardiovascular Disease

## 2014-01-25 VITALS — BP 116/65

## 2014-01-25 DIAGNOSIS — R001 Bradycardia, unspecified: Secondary | ICD-10-CM

## 2014-01-25 DIAGNOSIS — I379 Nonrheumatic pulmonary valve disorder, unspecified: Secondary | ICD-10-CM | POA: Diagnosis not present

## 2014-01-25 DIAGNOSIS — E119 Type 2 diabetes mellitus without complications: Secondary | ICD-10-CM | POA: Insufficient documentation

## 2014-01-25 DIAGNOSIS — I2510 Atherosclerotic heart disease of native coronary artery without angina pectoris: Secondary | ICD-10-CM | POA: Diagnosis not present

## 2014-01-25 DIAGNOSIS — E785 Hyperlipidemia, unspecified: Secondary | ICD-10-CM | POA: Diagnosis not present

## 2014-01-25 DIAGNOSIS — I10 Essential (primary) hypertension: Secondary | ICD-10-CM | POA: Insufficient documentation

## 2014-01-25 HISTORY — PX: TRANSTHORACIC ECHOCARDIOGRAM: SHX275

## 2014-01-25 MED ORDER — PERFLUTREN LIPID MICROSPHERE
1.0000 mL | INTRAVENOUS | Status: AC | PRN
  Administered 2014-01-25: 2 mL via INTRAVENOUS

## 2014-01-25 MED ORDER — PERFLUTREN LIPID MICROSPHERE: 1.0000 mL | INTRAVENOUS | Status: DC | PRN

## 2014-01-25 NOTE — Progress Notes (Addendum)
2D Echocardiogram Complete.  Definity Contrast was administered.  01/25/2014   Maurice Ross, [place]

## 2014-02-17 DIAGNOSIS — E1165 Type 2 diabetes mellitus with hyperglycemia: Secondary | ICD-10-CM | POA: Diagnosis not present

## 2014-02-17 DIAGNOSIS — N189 Chronic kidney disease, unspecified: Secondary | ICD-10-CM | POA: Diagnosis not present

## 2014-02-17 DIAGNOSIS — E780 Pure hypercholesterolemia: Secondary | ICD-10-CM | POA: Diagnosis not present

## 2014-02-17 DIAGNOSIS — I10 Essential (primary) hypertension: Secondary | ICD-10-CM | POA: Diagnosis not present

## 2014-02-18 DIAGNOSIS — E1165 Type 2 diabetes mellitus with hyperglycemia: Secondary | ICD-10-CM | POA: Diagnosis not present

## 2014-04-06 DIAGNOSIS — I10 Essential (primary) hypertension: Secondary | ICD-10-CM | POA: Diagnosis not present

## 2014-04-06 DIAGNOSIS — E1165 Type 2 diabetes mellitus with hyperglycemia: Secondary | ICD-10-CM | POA: Diagnosis not present

## 2014-04-06 DIAGNOSIS — E780 Pure hypercholesterolemia: Secondary | ICD-10-CM | POA: Diagnosis not present

## 2014-04-06 DIAGNOSIS — N189 Chronic kidney disease, unspecified: Secondary | ICD-10-CM | POA: Diagnosis not present

## 2014-04-25 ENCOUNTER — Encounter: Payer: Self-pay | Admitting: Cardiovascular Disease

## 2014-04-25 ENCOUNTER — Ambulatory Visit (INDEPENDENT_AMBULATORY_CARE_PROVIDER_SITE_OTHER): Payer: Medicare Other | Admitting: Cardiovascular Disease

## 2014-04-25 VITALS — BP 100/62 | HR 56 | Ht 67.0 in | Wt 191.3 lb

## 2014-04-25 DIAGNOSIS — I4510 Unspecified right bundle-branch block: Secondary | ICD-10-CM | POA: Diagnosis not present

## 2014-04-25 DIAGNOSIS — R001 Bradycardia, unspecified: Secondary | ICD-10-CM

## 2014-04-25 DIAGNOSIS — I2510 Atherosclerotic heart disease of native coronary artery without angina pectoris: Secondary | ICD-10-CM | POA: Diagnosis not present

## 2014-04-25 DIAGNOSIS — E785 Hyperlipidemia, unspecified: Secondary | ICD-10-CM

## 2014-04-25 DIAGNOSIS — Z87442 Personal history of urinary calculi: Secondary | ICD-10-CM | POA: Diagnosis not present

## 2014-04-25 NOTE — Progress Notes (Signed)
Patient ID: Maurice Ross, male   DOB: 1940-11-02, 74 y.o.   MRN: [id number]     HPI: Maurice Ross, is a 74 y.o. male who presents for follow-up cardiology evaluation.  Maurice Ross has established CAD and in July 1997 suffered an anterior wall myocardial infarction and underwent PTCA/stenting of his LAD. Repeat intervention was done in 1998 due to in-stent restenosis. At that time he also had moderate disease in his circumflex and right coronary artery which medical therapy has been recommended. His last nuclear perfusion study was done in August 2013 which showed old scar in the apical septal and apical region. Ejection fraction was 57%. This study was unchanged from 2 years previously.  He has history of type 2 diabetes mellitus which recently has not been well controlled.he has been followed by Dr. Nadara Mustard for this.  He, his hemoglobin A1c had risen to 12.6 on 12/23/2013.  He states he has tried Byetta, metformin, and Actos without success.  As I last saw him he had seen Dr. Soyla Murphy for endocrinologic evaluation.  She discontinued his Actos, Glucophage, and Glucotrol, and he is now taking Humalog insulin.  He states that his blood sugars are better.  He is also seeing Dr. Kellie Simmering who is following his kidney stones.  He underwent an echo Doppler study on 01/25/2014.  This showed an ejection fraction of 45-50% which was not significantly changed from his prior echo in September 2011.  There was grade 1 diastolic dysfunction.  There was evidence for aortic sclerosis.  There was mild pulmonic regurgitation.   He denies chest pain.  He denies palpitations.  He denies PND, orthopnea.  He has a history of hyperlipidemia and has been taking Crestor 40 mg for this. He takes lisinopril 5 mg now, carvedilol 3.125 mg twice a day and stent of Lopressor for hypertension and his CAD.  He also a history of GERD for which he takes Prilosec 20 mg..   Past Medical History  Diagnosis Date  . Coronary artery disease    Coronary stent 1997, Last cath 1998, Last Nuc 10/24/2011-no change from previous, Last Echo 2011 EF 45%  . Diabetes mellitus     medical treatment x 10 years  . Chronic kidney disease     [phone] kidney stones  . Myocardial infarction 1997    acute Ant MI  . Hyperlipidemia     lipomet analysis LDL particle # at 786  . Nephrolithiasis 11/2011    Past Surgical History  Procedure Laterality Date  . Back surgery  1998  . Stent  1998    coronary stent  . Colonoscopy  2004    By Dr.Rehman at [facility]  . Colonoscopy with esophagogastroduodenoscopy (egd)  01/24/2012    Procedure: COLONOSCOPY WITH ESOPHAGOGASTRODUODENOSCOPY (EGD);  Surgeon: Rogene Houston, MD;  [facility];  Service: Endoscopy;  Laterality: N/A;  955  . Balloon dilation  01/24/2012    Procedure: BALLOON DILATION;  Surgeon: Rogene Houston, MD;  [facility];  Service: Endoscopy;  Laterality: N/A;  Venia Minks dilation  01/24/2012    Procedure: Venia Minks DILATION;  Surgeon: Rogene Houston, MD;  [facility];  Service: Endoscopy;  Laterality: N/A;  . Savory dilation  01/24/2012    Procedure: SAVORY DILATION;  Surgeon: Rogene Houston, MD;  [facility];  Service: Endoscopy;  Laterality: N/A;  . Coronary stent placement  09/15/1995    PTCA/Stent Palmaz-schatz stent to prox. LAD for ant  MI  . Coronary angioplasty  06/08/1996    PTCA to prox. LAD for in-stent restenosis, residual disease of 30-50% and 40-50% narrowings in his RCA and ectasia of LCX.    No Known Allergies  Current Outpatient Prescriptions  Medication Sig Dispense Refill  . aspirin 81 MG tablet Take 81 mg by mouth daily.    . carvedilol (COREG) 3.125 MG tablet Take 1 tablet (3.125 mg total) by mouth 2 (two) times daily. 180 tablet 3  . Insulin Lispro Prot & Lispro (HUMALOG MIX 75/25 KWIKPEN [place]) Inject 30 Units into the skin 2 (two) times daily before a meal.    . lisinopril (PRINIVIL,ZESTRIL) 5 MG tablet Take 1 tablet (5  mg total) by mouth daily. (Patient taking differently: Take 10 mg by mouth daily. ) 30 tablet 0  . Omega-3 Fatty Acids (FISH OIL) 1200 MG CAPS Take 1 capsule by mouth daily.    Marland Kitchen omeprazole (PRILOSEC) 20 MG capsule Take 1 capsule by mouth Daily.    . potassium citrate (UROCIT-K) 10 MEQ (1080 MG) SR tablet Take 10 mEq by mouth 2 (two) times daily.    . rosuvastatin (CRESTOR) 40 MG tablet Take 40 mg by mouth daily.     No current facility-administered medications for this visit.    History   Social History  . Marital Status: Married    Spouse Name: Maurice Ross    Number of Children: 2  . Years of Education: N/A   Occupational History  . retired Engineer, manufacturing systems    Social History Main Topics  . Smoking status: Never Smoker   . Smokeless tobacco: Never Used     Comment: patient's states t hat he smokes his pipe in the summer time only  . Alcohol Use: No  . Drug Use: No  . Sexual Activity: Not on file   Other Topics Concern  . Not on file   Social History Narrative    Family History  Problem Relation Age of Onset  . Heart disease Mother   . Heart disease Father   . Healthy Son   . Healthy Son    Social history is notable that he is married has 2 children. He does not routinely exercise. There is no tobacco or alcohol use.   ROS General: Negative; No fevers, chills, or night sweats;  HEENT: Negative; No changes in vision or hearing, sinus congestion, difficulty swallowing Pulmonary: Negative; No cough, wheezing, shortness of breath, hemoptysis Cardiovascular: Negative; No chest pain, presyncope, syncope, palpitations GI: positive for GERD; No nausea, vomiting, diarrhea, or abdominal pain GU: Positive for kidney stones; No dysuria, hematuria, or difficulty voiding Musculoskeletal: Negative; no myalgias, joint pain, or weakness Hematologic/Oncology: Negative; no easy bruising, bleeding Endocrine: positive for poorly controlled diabetes mellitus.  No thyroid abnormalities. Neuro:  Negative; no changes in balance, headaches Skin: Negative; No rashes or skin lesions Psychiatric: Negative; No behavioral problems, depression Sleep: Negative; No snoring, daytime sleepiness, hypersomnolence, bruxism, restless legs, hypnogognic hallucinations, no cataplexy Other comprehensive 14 point system review is negative.   PE BP 100/62 mmHg  Pulse 56  Ht 5\' 7"  (1.702 m)  Wt 191 lb 4.8 oz (86.773 kg)  BMI 29.95 kg/m2  General: Alert, oriented, no distress.  Skin: normal turgor, no rashes HEENT: Normocephalic, atraumatic. Pupils round and reactive; sclera anicteric;no lid lag.  Nose without nasal septal hypertrophy Mouth/Parynx benign; Mallinpatti scale 3 Neck: No JVD, no carotid bruits with normal carotid upstroke Lungs: clear to ausculatation and percussion; no wheezing or rales Chest  wall: Nontender to palpation Heart: RRR, s1 s2 normal 1/6 systolic murmur; .  No diastolic murmur.  No S3 gallop.  No rub thrills or heaves Abdomen: Moderately large diastases recti; soft, nontender; no hepatosplenomehaly, BS+; abdominal aorta nontender and not dilated by palpation. Back: No CVA tenderness Pulses 2+ Extremities: no clubbing cyanosis or edema, Homan's sign negative  Neurologic: grossly nonfocal Psychological: Normal affect and mood  ECG (independently read by me): Sinus bradycardia 56 bpm.  Right bundle-branch block with repolarization changes.  Septal Q wave in V1.  November 2015 ECG (independently read by me): Marked sinus bradycardia at 44 bpm.  Right bundle branch block.  Probable left anterior fascicular block. Small septal Q waves  October 2014 ECG: Sinus bradycardia with right bundle branch block. Small Q waves in leads V1 and V2 concord with septal infarct that is old. Nonspecific T changes laterally.  LABS:  BMET    Component Value Date/Time   NA 137 11/30/2013 0847   K 4.4 11/30/2013 0847   CL 105 11/30/2013 0847   CO2 22 11/30/2013 0847   GLUCOSE 299*  11/30/2013 0847   BUN 27* 11/30/2013 0847   CREATININE 2.19* 11/30/2013 0847   CALCIUM 9.1 11/30/2013 0847     Hepatic Function Panel     Component Value Date/Time   PROT 6.2 11/30/2013 0847   ALBUMIN 3.9 11/30/2013 0847   AST 18 11/30/2013 0847   ALT 24 11/30/2013 0847   ALKPHOS 59 11/30/2013 0847   BILITOT 0.5 11/30/2013 0847     CBC    Component Value Date/Time   WBC 7.0 11/30/2013 0847   RBC 5.21 11/30/2013 0847   HGB 14.1 11/30/2013 0847   HCT 42.1 11/30/2013 0847   PLT 177 11/30/2013 0847   MCV 80.8 11/30/2013 0847   MCH 27.1 11/30/2013 0847   MCHC 33.5 11/30/2013 0847   RDW 15.0 11/30/2013 0847     BNP No results found for: PROBNP  Lipid Panel     Component Value Date/Time   CHOL 101 11/30/2013 0847   TRIG 137 11/30/2013 0847   HDL 33* 11/30/2013 0847   CHOLHDL 3.1 11/30/2013 0847   VLDL 27 11/30/2013 0847   LDLCALC 41 11/30/2013 0847     RADIOLOGY: No results found.    ASSESSMENT AND PLAN: Maurice Ross is status post an anterior wall myocardial infarction in 1997 for which he underwent initial intervention to his LAD and required repeat intervention secondary to restenosis in 1998. He has concomitant CAD and has been on medical therapy. His last nuclear perfusion study in August 2013 was unchanged from 2011 and only showed a very small region of abnormality in the apical septal region.  I last saw him, he was bradycardic on metoprolol and particularly with his glucose intolerance.  I changed him to carvedilol 3.125 mg twice a day.  He has tolerated this well.  He is asymptomatic with reference to his bradycardia with his heart rate currently at 56.  He has lost weight.  He is now on a new diabetic regimen and is tolerating Humalog insulin.  His blood pressure today is controlled but low at 100/62.  He is not orthostatic.  His last nuclear stress test was in 2013.  He will continue to take his current medical regimen.  In one-year, I'm scheduling him  to undergo a three-year follow-up exercise Myoview study to further evaluate his underlying CAD.  I will see him in follow-up of his exercise test and further recommendations  will be made at that time.  Time spent: 25 minutes Troy Sine, MD, [facility]  04/25/2014 7:07 PM

## 2014-04-25 NOTE — Patient Instructions (Addendum)
Dr Claiborne Billings has ordered the following test(s) to be done: 1. Exercise Myoview - For further information please visit [email or web address]. Please follow instruction sheet, as given.  Dr Claiborne Billings wants you to follow-up in 1 year. You will receive a reminder letter in the mail one months in advance. If you don't receive a letter, please call our office to schedule the follow-up appointment.

## 2014-06-03 DIAGNOSIS — E119 Type 2 diabetes mellitus without complications: Secondary | ICD-10-CM | POA: Diagnosis not present

## 2014-07-05 NOTE — Op Note (Signed)
PATIENT NAME:  Maurice Ross, CRUMPACKER MR#:  [id number] DATE OF BIRTH:  Feb 05, 1941  DATE OF PROCEDURE:  02/28/2012  PREOPERATIVE DIAGNOSIS: Cataract, left eye.   POSTOPERATIVE DIAGNOSIS: Cataract, left eye.   PROCEDURE PERFORMED: Extracapsular cataract extraction using phacoemulsification with placement of an Alcon [id number], 18.0-diopter posterior chamber lens, serial number [id number].   SURGEON: Loura Back Daemion Mcniel, MD   ANESTHESIA: 4% lidocaine and 0.75% Marcaine in a 50 to 50 mixture with 10 units/mL of Hylenex added given as a peribulbar.   ANESTHESIOLOGIST: Dr. Kayleen Memos.   COMPLICATIONS: None.   ESTIMATED BLOOD LOSS: Less than 1 mL.   DESCRIPTION OF PROCEDURE:  The patient was brought to the operating room and given a peribulbar block.  The patient was then prepped and draped in the usual fashion.  The vertical rectus muscles were imbricated using 5-0 silk sutures.  These sutures were then clamped to the sterile drapes as bridle sutures.  A limbal peritomy was performed extending two clock hours and hemostasis was obtained with cautery.  A partial thickness scleral groove was made at the surgical limbus and dissected anteriorly in a lamellar dissection using an Alcon crescent knife.  The anterior chamber was entered supero-temporally with a Superblade and through the lamellar dissection with a 2.6 mm keratome.  DisCoVisc was used to replace the aqueous and a continuous tear capsulorrhexis was carried out.  Hydrodissection and hydrodelineation were carried out with balanced salt and a 27 gauge canula.  The nucleus was rotated to confirm the effectiveness of the hydrodissection.  Phacoemulsification was carried out using a divide-and-conquer technique.  Total ultrasound time was 1 minute and 19 seconds with an average power of 13.5 percent, CDE 17.90. Irrigation/aspiration was used to remove the residual cortex.  DisCoVisc was used to inflate the capsule and the internal incision was enlarged to 3 mm  with the crescent knife.  The intraocular lens was folded and inserted into the capsular bag using the AcrySert Delivery System.   Irrigation/aspiration was used to remove the residual DisCoVisc.  Miostat was injected into the anterior chamber through the paracentesis track to inflate the anterior chamber and induce miosis.  The wound was checked for leaks and none were found. The conjunctiva was closed with cautery and the bridle sutures were removed.  Two drops of 0.3% Vigamox were placed on the eye.   An eye shield was placed on the eye.  The patient was discharged to the recovery room in good condition.  ____________________________ Loura Back Rhyse Skowron, MD sad:cbb D: 02/28/2012 11:25:04 ET T: 02/28/2012 11:31:41 ET JOB#: [id number]  cc: Remo Lipps A. Najeeb Uptain, MD, <Dictator> Martie Lee MD ELECTRONICALLY SIGNED 03/02/2012 13:20

## 2014-07-08 NOTE — Op Note (Signed)
PATIENT NAME:  Maurice Ross, Maurice Ross MR#:  [id number] DATE OF BIRTH:  09-19-1940  DATE OF PROCEDURE:  01/11/2013  PREOPERATIVE DIAGNOSIS: Cataract, right eye.   POSTOPERATIVE DIAGNOSIS: Cataract, right eye.   PROCEDURE PERFORMED: Extracapsular cataract extraction using phacoemulsification with placement Alcon [id number], 16.0-diopter posterior chamber lens, serial number [id number].   SURGEON: Loura Back. Brahim Dolman, M.D.   ANESTHESIA: With 4% lidocaine, 0.75% Marcaine, a 50:50 mixture, with 10 units/mL of Hylenex added, given as peribulbar.   ANESTHESIOLOGIST: Dr. Boston Service.   ASSISTANT:  None.  COMPLICATIONS: None.   ESTIMATED BLOOD LOSS: Less than 1 mL.   DESCRIPTION OF PROCEDURE:  The patient was brought to the operating room and given a peribulbar block.  The patient was then prepped and draped in the usual fashion.  The vertical rectus muscles were imbricated using 5-0 silk sutures.  These sutures were then clamped to the sterile drapes as bridle sutures.  A limbal peritomy was performed extending two clock hours and hemostasis was obtained with cautery.  A partial thickness scleral groove was made at the surgical limbus and then dissected anteriorly in a lamellar dissection with using an Alcon crescent knife.  The anterior chamber was entered superonasally with a Superblade and through the lamellar dissection with a 2.6-mm keratome.  DisCoVisc was used to replace the aqueous and a continuous tear capsulorrhexis was carried out.  Hydrodissection and hydrodelineation were carried out with balanced salt and a 27 gauge canula.  The nucleus was rotated to confirm the effectiveness of the hydrodissection.  Phacoemulsification was carried out using a divide-and-conquer technique.  Total ultrasound time was 1 minute and 30.3 seconds with an average power of 25.3 percent. CDE of 34.93.   Irrigation/aspiration was used to remove the residual cortex.  DisCoVisc was used to inflate the capsule and the  internal wound was enlarged to 3 mm with the crescent knife.  The intraocular lens was inserted into the capsular bag using the AcrySert delivery system.  Irrigation/aspiration was used to remove the residual DisCoVisc.  Miostat was injected into the anterior chamber through the paracentesis track to inflate the anterior chamber and induce miosis.  The wound was checked for leaks and wound leakage was found.  A tenth of a milliliter of cefuroxime was injected through the paracentesis track. A single 10-0 suture was placed across the incision, tied and the knot was rotated superiorly.  The conjunctiva was closed with cautery and the bridle sutures were removed.  Two drops of 0.3% Vigamox were placed on the eye.  An eye shield was placed on the eye.  The patient was discharged to the recovery room in good condition.   ____________________________ Loura Back Boy Delamater, MD sad:np D: 01/11/2013 13:23:27 ET T: 01/11/2013 16:29:25 ET JOB#: [id number]  cc: Remo Lipps A. Greysen Swanton, MD, <Dictator> Martie Lee MD ELECTRONICALLY SIGNED 01/18/2013 8:24

## 2014-08-01 DIAGNOSIS — N281 Cyst of kidney, acquired: Secondary | ICD-10-CM | POA: Diagnosis not present

## 2014-08-22 DIAGNOSIS — I10 Essential (primary) hypertension: Secondary | ICD-10-CM | POA: Diagnosis not present

## 2014-08-22 DIAGNOSIS — E1165 Type 2 diabetes mellitus with hyperglycemia: Secondary | ICD-10-CM | POA: Diagnosis not present

## 2014-08-22 DIAGNOSIS — N189 Chronic kidney disease, unspecified: Secondary | ICD-10-CM | POA: Diagnosis not present

## 2014-08-22 DIAGNOSIS — E780 Pure hypercholesterolemia: Secondary | ICD-10-CM | POA: Diagnosis not present

## 2014-09-03 DIAGNOSIS — E119 Type 2 diabetes mellitus without complications: Secondary | ICD-10-CM | POA: Diagnosis not present

## 2014-09-12 ENCOUNTER — Other Ambulatory Visit: Payer: Self-pay

## 2014-10-19 DIAGNOSIS — E11359 Type 2 diabetes mellitus with proliferative diabetic retinopathy without macular edema: Secondary | ICD-10-CM | POA: Diagnosis not present

## 2014-12-03 DIAGNOSIS — E119 Type 2 diabetes mellitus without complications: Secondary | ICD-10-CM | POA: Diagnosis not present

## 2014-12-20 DIAGNOSIS — H903 Sensorineural hearing loss, bilateral: Secondary | ICD-10-CM | POA: Diagnosis not present

## 2015-01-03 ENCOUNTER — Other Ambulatory Visit: Payer: Self-pay | Admitting: Cardiovascular Disease

## 2015-01-05 DIAGNOSIS — Z23 Encounter for immunization: Secondary | ICD-10-CM | POA: Diagnosis not present

## 2015-01-13 DIAGNOSIS — N189 Chronic kidney disease, unspecified: Secondary | ICD-10-CM | POA: Diagnosis not present

## 2015-01-13 DIAGNOSIS — E119 Type 2 diabetes mellitus without complications: Secondary | ICD-10-CM | POA: Diagnosis not present

## 2015-01-13 DIAGNOSIS — E1165 Type 2 diabetes mellitus with hyperglycemia: Secondary | ICD-10-CM | POA: Diagnosis not present

## 2015-01-13 DIAGNOSIS — E7800 Pure hypercholesterolemia, unspecified: Secondary | ICD-10-CM | POA: Diagnosis not present

## 2015-01-18 DIAGNOSIS — Z0000 Encounter for general adult medical examination without abnormal findings: Secondary | ICD-10-CM | POA: Diagnosis not present

## 2015-01-18 DIAGNOSIS — N200 Calculus of kidney: Secondary | ICD-10-CM | POA: Diagnosis not present

## 2015-04-10 ENCOUNTER — Encounter: Payer: Self-pay | Admitting: Cardiovascular Disease

## 2015-04-10 ENCOUNTER — Ambulatory Visit (INDEPENDENT_AMBULATORY_CARE_PROVIDER_SITE_OTHER): Payer: Medicare Other | Admitting: Cardiovascular Disease

## 2015-04-10 VITALS — BP 104/66 | HR 60 | Ht 67.0 in | Wt 199.1 lb

## 2015-04-10 DIAGNOSIS — I2583 Coronary atherosclerosis due to lipid rich plaque: Secondary | ICD-10-CM

## 2015-04-10 DIAGNOSIS — I10 Essential (primary) hypertension: Secondary | ICD-10-CM

## 2015-04-10 DIAGNOSIS — I2510 Atherosclerotic heart disease of native coronary artery without angina pectoris: Secondary | ICD-10-CM

## 2015-04-10 DIAGNOSIS — I4510 Unspecified right bundle-branch block: Secondary | ICD-10-CM | POA: Diagnosis not present

## 2015-04-10 DIAGNOSIS — K219 Gastro-esophageal reflux disease without esophagitis: Secondary | ICD-10-CM

## 2015-04-10 DIAGNOSIS — N183 Chronic kidney disease, stage 3 unspecified: Secondary | ICD-10-CM

## 2015-04-10 DIAGNOSIS — E785 Hyperlipidemia, unspecified: Secondary | ICD-10-CM

## 2015-04-10 NOTE — Patient Instructions (Signed)
Your physician has recommended you make the following change in your medication:   The lisinopril has been cut to  5 mg daily. ( 1/2 tablet)  Your physician recommends that you return for lab work in: 1 month.  Your physician wants you to follow-up in: 6 months or sooner if needed. You will receive a reminder letter in the mail two months in advance. If you don't receive a letter, please call our office to schedule the follow-up appointment.  If you need a refill on your cardiac medications before your next appointment, please call your pharmacy.

## 2015-04-12 ENCOUNTER — Encounter: Payer: Self-pay | Admitting: Cardiovascular Disease

## 2015-04-12 DIAGNOSIS — N183 Chronic kidney disease, stage 3 unspecified: Secondary | ICD-10-CM | POA: Insufficient documentation

## 2015-04-12 NOTE — Progress Notes (Signed)
Patient ID: Maurice Ross, male   DOB: November 25, 1940, 74 y.o.   MRN: [id number]   Primary MD: Dr. Rory Percy  HPI: Maurice Ross, is a 75 y.o. male who presents for an 72 month follow-up cardiology evaluation.  Maurice Ross has established CAD and in July 1997 suffered an anterior wall myocardial infarction and underwent PTCA/stenting of his LAD. Repeat intervention was done in 1998 due to in-stent restenosis. At that time he also had moderate disease in his circumflex and right coronary artery which medical therapy has been recommended. His last nuclear perfusion study was done in August 2013 which showed old scar in the apical septal and apical region. Ejection fraction was 57%. This study was unchanged from 2 years previously.  He has history of type 2 diabetes mellitus  He, his hemoglobin A1c had risen to 12.6 on 12/23/2013.  Over the past year he had seen Dr. Soyla Murphy for endocrinologic evaluation.  Lab work by Dr. Nadara Mustard on 01/13/2015 still demonstrated increased hemoglobin A1c at 10.6.  She discontinued his Actos, Glucophage, and Glucotrol, and he is now taking Humalog insulin.  He states that his blood sugars are better.  He is also seeing Dr. Kellie Simmering who is following his kidney stones.  An echo Doppler study on 01/25/2014 showed an ejection fraction of 45-50% which was not significantly changed from his prior echo in September 2011.  There was grade 1 diastolic dysfunction.  There was evidence for aortic sclerosis.  There was mild pulmonic regurgitation.  Over the past year, 8's.  He has felt well from a cardiac standpoint.  He specifically denies any episodes of chest pain.  He had increased stress over the past year with deaths of 3 of his family members including his father age 32, mother age 48, and mother-in-law on Christmas Eve age 36.  He denies chest pain.  He denies palpitations.  He denies PND, orthopnea.  He has a history of hyperlipidemia and has been taking Crestor 40 mg for this. He takes  lisinopril 10 mg now, carvedilol 3.125 mg twice a day  for hypertension and his CAD.  He also a history of GERD for which he takes Prilosec 20 mg.  He presents for evaluation.   Past Medical History  Diagnosis Date  . Coronary artery disease     Coronary stent 1997, Last cath 1998, Last Nuc 10/24/2011-no change from previous, Last Echo 2011 EF 45%  . Diabetes mellitus     medical treatment x 10 years  . Chronic kidney disease     [phone] kidney stones  . Myocardial infarction [facility]) 1997    acute Ant MI  . Hyperlipidemia     lipomet analysis LDL particle # at 786  . Nephrolithiasis 11/2011    Past Surgical History  Procedure Laterality Date  . Back surgery  1998  . Stent  1998    coronary stent  . Colonoscopy  2004    By Dr[facility]Rehman at [facility]  . Colonoscopy with esophagogastroduodenoscopy (egd)  01/24/2012    Procedure: COLONOSCOPY WITH ESOPHAGOGASTRODUODENOSCOPY (EGD);  Surgeon: Rogene Houston, MD;  Location: [facility];  Service: Endoscopy;  Laterality: N/A;  955  . Balloon dilation  01/24/2012    Procedure: BALLOON DILATION;  Surgeon: Rogene Houston, MD;  Location: [facility];  Service: Endoscopy;  Laterality: N/A;  Venia Minks dilation  01/24/2012    Procedure: Venia Minks DILATION;  Surgeon: Rogene Houston, MD;  Location: [facility];  Service: Endoscopy;  Laterality: N/A;  . Savory dilation  01/24/2012    Procedure: SAVORY DILATION;  Surgeon: Rogene Houston, MD;  Location: [facility];  Service: Endoscopy;  Laterality: N/A;  . Coronary stent placement  09/15/1995    PTCA/Stent Palmaz-schatz stent to prox. LAD for ant MI  . Coronary angioplasty  06/08/1996    PTCA to prox. LAD for in-stent restenosis, residual disease of 30-50% and 40-50% narrowings in his RCA and ectasia of LCX.    No Known Allergies  Current Outpatient Prescriptions  Medication Sig Dispense Refill  . aspirin 81 MG tablet Take 81 mg by mouth daily.    . carvedilol (COREG) 3.125 MG tablet Take 1  tablet by mouth two  times daily 180 tablet 1  . Insulin Lispro Prot & Lispro (HUMALOG MIX 75/25 KWIKPEN [place]) Inject 30 Units into the skin 2 (two) times daily before a meal.    . lisinopril (PRINIVIL,ZESTRIL) 10 MG tablet Take 0.5 tablets by mouth daily.    . Omega-3 Fatty Acids (FISH OIL) 1200 MG CAPS Take 1 capsule by mouth daily.    Marland Kitchen omeprazole (PRILOSEC) 20 MG capsule Take 1 capsule by mouth Daily.    . potassium citrate (UROCIT-K) 10 MEQ (1080 MG) SR tablet Take 10 mEq by mouth 2 (two) times daily.    . rosuvastatin (CRESTOR) 40 MG tablet Take 40 mg by mouth daily.     No current facility-administered medications for this visit.    Social History   Social History  . Marital Status: Married    Spouse Name: Maurice Ross  . Number of Children: 2  . Years of Education: N/A   Occupational History  . retired Engineer, manufacturing systems    Social History Main Topics  . Smoking status: Never Smoker   . Smokeless tobacco: Never Used     Comment: patient's states t hat he smokes his pipe in the summer time only  . Alcohol Use: No  . Drug Use: No  . Sexual Activity: Not on file   Other Topics Concern  . Not on file   Social History Narrative    Family History  Problem Relation Age of Onset  . Heart disease Mother   . Heart disease Father   . Healthy Son   . Healthy Son    Social history is notable that he is married has 2 children. He does not routinely exercise. There is no tobacco or alcohol use.   ROS General: Negative; No fevers, chills, or night sweats;  HEENT: Negative; No changes in vision or hearing, sinus congestion, difficulty swallowing Pulmonary: Negative; No cough, wheezing, shortness of breath, hemoptysis Cardiovascular: Negative; No chest pain, presyncope, syncope, palpitations GI: positive for GERD; No nausea, vomiting, diarrhea, or abdominal pain GU: Positive for kidney stones; No dysuria, hematuria, or difficulty voiding Musculoskeletal: Negative; no myalgias, joint  pain, or weakness Hematologic/Oncology: Negative; no easy bruising, bleeding Endocrine: positive for poorly controlled diabetes mellitus.  No thyroid abnormalities. Neuro: Negative; no changes in balance, headaches Skin: Negative; No rashes or skin lesions Psychiatric: Negative; No behavioral problems, depression Sleep: Negative; No snoring, daytime sleepiness, hypersomnolence, bruxism, restless legs, hypnogognic hallucinations, no cataplexy Other comprehensive 14 point system review is negative.   PE BP 104/66 mmHg  Pulse 60  Ht _0  (1.702 m)  Wt 199 lb 2 oz (90.323 kg)  BMI 31.18 kg/m2   Wt Readings from Last 3 Encounters:  04/10/15 199 lb 2 oz (90.323 kg)  04/25/14 191 lb 4.8 oz (86.773  kg)  01/18/14 191 lb (86.637 kg)   General: Alert, oriented, no distress.  Skin: normal turgor, no rashes HEENT: Normocephalic, atraumatic. Pupils round and reactive; sclera anicteric;no lid lag.  Nose without nasal septal hypertrophy Mouth/Parynx benign; Mallinpatti scale 3 Neck: No JVD, no carotid bruits with normal carotid upstroke Lungs: clear to ausculatation and percussion; no wheezing or rales Chest wall: Nontender to palpation Heart: RRR, s1 s2 normal 1/6 systolic murmur; .  No diastolic murmur.  No S3 gallop.  No rub thrills or heaves Abdomen: Moderately large diastases recti; soft, nontender; no hepatosplenomehaly, BS+; abdominal aorta nontender and not dilated by palpation. Back: No CVA tenderness Pulses 2+ Extremities: no clubbing cyanosis or edema, Homan's sign negative  Neurologic: grossly nonfocal Psychological: Normal affect and mood  ECG (independently read by me): Normal sinus rhythm at 60 bpm.  Right bundle-branch block and left anterior hemiblock consistent with bifascicular block.  QTc interval 432 ms.  February 2016 ECG (independently read by me): Sinus bradycardia 56 bpm.  Right bundle-branch block with repolarization changes.  Septal Q wave in V1.  November 2015  ECG (independently read by me): Marked sinus bradycardia at 44 bpm.  Right bundle branch block.  Probable left anterior fascicular block. Small septal Q waves  October 2014 ECG: Sinus bradycardia with right bundle branch block. Small Q waves in leads V1 and V2 concord with septal infarct that is old. Nonspecific T changes laterally.  LABS:  I have personally reviewed the outpatient blood work done on 01/13/2015 by Dr. Rory Percy.  Of note, glucose was elevated at 285, BUN 25, creatinine 2.0.  GFR 30 to.  Total cholesterol 95, triglycerides 82, HDL 36, LDL 43.  Normal LFTs.  A1c was elevated at 10.6.  BMP Latest Ref Rng 11/30/2013 01/06/2013 12/15/2012  Glucose 70 - 99 mg/dL 299(H) 263(H) 155(H)  BUN 6 - 23 mg/dL 27(H) 29(H) 27(H)  Creatinine 0.50 - 1.35 mg/dL 2.19(H) 2.12(H) 1.99(H)  Sodium 135 - 145 mEq/L 137 137 138  Potassium 3.5 - 5.3 mEq/L 4.4 4.9 4.5  Chloride 96 - 112 mEq/L 105 105 107  CO2 19 - 32 mEq/L _0 Calcium 8.4 - 10.5 mg/dL 9.1 9.6 9.3   Hepatic Function Latest Ref Rng 11/30/2013 12/15/2012  Total Protein 6.0 - 8.3 g/dL 6.2 6.3  Albumin 3.5 - 5.2 g/dL 3.9 4.0  AST 0 - 37 U/L 18 18  ALT 0 - 53 U/L 24 18  Alk Phosphatase 39 - 117 U/L 59 46  Total Bilirubin 0.2 - 1.2 mg/dL 0.5 0.4   CBC Latest Ref Rng 11/30/2013 12/15/2012  WBC 4.0 - 10.5 K/uL 7.0 6.5  Hemoglobin 13.0 - 17.0 g/dL 14.1 12.8(L)  Hematocrit 39.0 - 52.0 % 42.1 38.1(L)  Platelets 150 - 400 K/uL 177 213   Lab Results  Component Value Date   MCV 80.8 11/30/2013   MCV 83.0 12/15/2012   Lab Results  Component Value Date   TSH 1.153 11/30/2013   Lab Results  Component Value Date   HGBA1C 12.8* 11/30/2013   Lipid Panel     Component Value Date/Time   CHOL 101 11/30/2013 0847   TRIG 137 11/30/2013 0847   HDL 33* 11/30/2013 0847   CHOLHDL 3.1 11/30/2013 0847   VLDL 27 11/30/2013 0847   LDLCALC 41 11/30/2013 0847     RADIOLOGY: No results found.    ASSESSMENT AND PLAN: Maurice Ross  is a 75 year old Caucasian male who is status post an anterior wall  myocardial infarction in 1997 for which he underwent initial intervention to his LAD and required repeat intervention secondary to restenosis in 1998. He has concomitant CAD and has been on medical therapy. His last nuclear perfusion study in August 2013 was unchanged from 2011 and only showed a very small region of abnormality in the apical septal region.  Blood pressure today is controlled, but low at 104/60.  At times he has noted some mild lightheadedness.  There is no orthostatic change.  He currently has been on lisinopril 10 mg and low-dose carvedilol.  He no longer is significantly bradycardic on low-dose carvedilol instead of metoprolol.  I have suggested he try reducing his lisinopril back down to 5 mg daily since his blood pressure is somewhat low and he does have renal insufficiency , stage III/borderline stage IV , with a GFR of 32, which may actually improve renal perfusion.  He is not had any psoas of recurrent chest pain.  His diabetes mellitus is still not optimally controlled.  He tells me he will be seeing Dr. Bubba Camp in follow-up.  He is tolerating rosuvastatin 40 mg for hyperlipidemia with an excellent LDL at 43.  He is mildly obese with a body mass index of 31.18 kg/m.  I have recommended additional weight loss and increased exercise.  I will see him in 6 months for cardiology reevaluation.  Time spent: 25 minutes Troy Sine, MD, [facility]  04/12/2015 8:01 AM

## 2015-04-13 DIAGNOSIS — I10 Essential (primary) hypertension: Secondary | ICD-10-CM | POA: Diagnosis not present

## 2015-04-13 DIAGNOSIS — E7800 Pure hypercholesterolemia, unspecified: Secondary | ICD-10-CM | POA: Diagnosis not present

## 2015-04-13 DIAGNOSIS — E1165 Type 2 diabetes mellitus with hyperglycemia: Secondary | ICD-10-CM | POA: Diagnosis not present

## 2015-04-13 DIAGNOSIS — N189 Chronic kidney disease, unspecified: Secondary | ICD-10-CM | POA: Diagnosis not present

## 2015-04-15 DIAGNOSIS — E119 Type 2 diabetes mellitus without complications: Secondary | ICD-10-CM | POA: Diagnosis not present

## 2015-04-18 DIAGNOSIS — E113553 Type 2 diabetes mellitus with stable proliferative diabetic retinopathy, bilateral: Secondary | ICD-10-CM | POA: Diagnosis not present

## 2015-05-17 DIAGNOSIS — D229 Melanocytic nevi, unspecified: Secondary | ICD-10-CM

## 2015-05-17 HISTORY — DX: Melanocytic nevi, unspecified: D22.9

## 2015-05-24 ENCOUNTER — Other Ambulatory Visit: Payer: Self-pay | Admitting: Physician Assistant

## 2015-05-24 DIAGNOSIS — L820 Inflamed seborrheic keratosis: Secondary | ICD-10-CM | POA: Diagnosis not present

## 2015-05-24 DIAGNOSIS — I10 Essential (primary) hypertension: Secondary | ICD-10-CM | POA: Diagnosis not present

## 2015-05-24 DIAGNOSIS — C44319 Basal cell carcinoma of skin of other parts of face: Secondary | ICD-10-CM | POA: Diagnosis not present

## 2015-05-24 DIAGNOSIS — N189 Chronic kidney disease, unspecified: Secondary | ICD-10-CM | POA: Diagnosis not present

## 2015-05-24 DIAGNOSIS — L821 Other seborrheic keratosis: Secondary | ICD-10-CM | POA: Diagnosis not present

## 2015-05-24 DIAGNOSIS — E1165 Type 2 diabetes mellitus with hyperglycemia: Secondary | ICD-10-CM | POA: Diagnosis not present

## 2015-05-24 DIAGNOSIS — E7800 Pure hypercholesterolemia, unspecified: Secondary | ICD-10-CM | POA: Diagnosis not present

## 2015-05-24 DIAGNOSIS — D485 Neoplasm of uncertain behavior of skin: Secondary | ICD-10-CM | POA: Diagnosis not present

## 2015-05-24 DIAGNOSIS — D225 Melanocytic nevi of trunk: Secondary | ICD-10-CM | POA: Diagnosis not present

## 2015-06-12 DIAGNOSIS — I10 Essential (primary) hypertension: Secondary | ICD-10-CM | POA: Diagnosis not present

## 2015-06-13 LAB — BASIC METABOLIC PANEL
BUN: 17 mg/dL (ref 7–25)
CHLORIDE: 105 mmol/L (ref 98–110)
CO2: 22 mmol/L (ref 20–31)
Calcium: 8.9 mg/dL (ref 8.6–10.3)
Creat: 1.74 mg/dL — ABNORMAL HIGH (ref 0.70–1.18)
Glucose, Bld: 198 mg/dL — ABNORMAL HIGH (ref 65–99)
Potassium: 4.3 mmol/L (ref 3.5–5.3)
Sodium: 137 mmol/L (ref 135–146)

## 2015-06-22 DIAGNOSIS — C44319 Basal cell carcinoma of skin of other parts of face: Secondary | ICD-10-CM | POA: Diagnosis not present

## 2015-09-16 ENCOUNTER — Emergency Department (HOSPITAL_COMMUNITY): Payer: Medicare Other

## 2015-09-16 ENCOUNTER — Emergency Department (HOSPITAL_COMMUNITY)
Admission: EM | Admit: 2015-09-16 | Discharge: 2015-09-16 | Disposition: A | Discharge status: Home / Self Care | Payer: Medicare Other | Attending: Emergency Medicine | Admitting: Emergency Medicine

## 2015-09-16 ENCOUNTER — Encounter (HOSPITAL_COMMUNITY): Payer: Self-pay | Admitting: Emergency Medicine

## 2015-09-16 DIAGNOSIS — I2510 Atherosclerotic heart disease of native coronary artery without angina pectoris: Secondary | ICD-10-CM | POA: Diagnosis not present

## 2015-09-16 DIAGNOSIS — E785 Hyperlipidemia, unspecified: Secondary | ICD-10-CM | POA: Insufficient documentation

## 2015-09-16 DIAGNOSIS — R1031 Right lower quadrant pain: Secondary | ICD-10-CM | POA: Diagnosis present

## 2015-09-16 DIAGNOSIS — I252 Old myocardial infarction: Secondary | ICD-10-CM | POA: Insufficient documentation

## 2015-09-16 DIAGNOSIS — Z794 Long term (current) use of insulin: Secondary | ICD-10-CM | POA: Diagnosis not present

## 2015-09-16 DIAGNOSIS — R1030 Lower abdominal pain, unspecified: Secondary | ICD-10-CM | POA: Diagnosis not present

## 2015-09-16 DIAGNOSIS — N189 Chronic kidney disease, unspecified: Secondary | ICD-10-CM | POA: Insufficient documentation

## 2015-09-16 DIAGNOSIS — Z79899 Other long term (current) drug therapy: Secondary | ICD-10-CM | POA: Diagnosis not present

## 2015-09-16 DIAGNOSIS — R109 Unspecified abdominal pain: Secondary | ICD-10-CM

## 2015-09-16 DIAGNOSIS — N23 Unspecified renal colic: Secondary | ICD-10-CM | POA: Insufficient documentation

## 2015-09-16 DIAGNOSIS — N200 Calculus of kidney: Secondary | ICD-10-CM | POA: Diagnosis not present

## 2015-09-16 DIAGNOSIS — E1122 Type 2 diabetes mellitus with diabetic chronic kidney disease: Secondary | ICD-10-CM | POA: Diagnosis not present

## 2015-09-16 DIAGNOSIS — Z7982 Long term (current) use of aspirin: Secondary | ICD-10-CM | POA: Diagnosis not present

## 2015-09-16 LAB — URINALYSIS, ROUTINE W REFLEX MICROSCOPIC
Bilirubin Urine: NEGATIVE
LEUKOCYTES UA: NEGATIVE
NITRITE: NEGATIVE
PH: 8 (ref 5.0–8.0)
Protein, ur: 100 mg/dL — AB
Specific Gravity, Urine: 1.015 (ref 1.005–1.030)

## 2015-09-16 LAB — CBC WITH DIFFERENTIAL/PLATELET
Basophils Absolute: 0 10*3/uL (ref 0.0–0.1)
Basophils Relative: 0 %
EOS ABS: 0.2 10*3/uL (ref 0.0–0.7)
Eosinophils Relative: 2 %
HCT: 47.5 % (ref 39.0–52.0)
HEMOGLOBIN: 16.6 g/dL (ref 13.0–17.0)
LYMPHS ABS: 1.8 10*3/uL (ref 0.7–4.0)
LYMPHS PCT: 19 %
MCH: 29.1 pg (ref 26.0–34.0)
MCHC: 34.9 g/dL (ref 30.0–36.0)
MCV: 83.3 fL (ref 78.0–100.0)
Monocytes Absolute: 0.5 10*3/uL (ref 0.1–1.0)
Monocytes Relative: 5 %
NEUTROS PCT: 74 %
Neutro Abs: 7 10*3/uL (ref 1.7–7.7)
Platelets: 123 10*3/uL — ABNORMAL LOW (ref 150–400)
RBC: 5.7 MIL/uL (ref 4.22–5.81)
RDW: 13.9 % (ref 11.5–15.5)
WBC: 9.5 10*3/uL (ref 4.0–10.5)

## 2015-09-16 LAB — COMPREHENSIVE METABOLIC PANEL
ALK PHOS: 61 U/L (ref 38–126)
ALT: 26 U/L (ref 17–63)
AST: 26 U/L (ref 15–41)
Albumin: 3.8 g/dL (ref 3.5–5.0)
Anion gap: 7 (ref 5–15)
BUN: 30 mg/dL — AB (ref 6–20)
CALCIUM: 9.4 mg/dL (ref 8.9–10.3)
CO2: 23 mmol/L (ref 22–32)
CREATININE: 2.01 mg/dL — AB (ref 0.61–1.24)
Chloride: 106 mmol/L (ref 101–111)
GFR calc non Af Amer: 31 mL/min — ABNORMAL LOW (ref >60)
GFR, EST AFRICAN AMERICAN: 36 mL/min — AB (ref >60)
Glucose, Bld: 271 mg/dL — ABNORMAL HIGH (ref 65–99)
Potassium: 3.9 mmol/L (ref 3.5–5.1)
SODIUM: 136 mmol/L (ref 135–145)
Total Bilirubin: 0.9 mg/dL (ref 0.3–1.2)
Total Protein: 6.6 g/dL (ref 6.5–8.1)

## 2015-09-16 LAB — LIPASE, BLOOD: Lipase: 44 U/L (ref 11–51)

## 2015-09-16 LAB — URINE MICROSCOPIC-ADD ON

## 2015-09-16 MED ORDER — SODIUM CHLORIDE 0.9 % IV BOLUS (SEPSIS)
1000.0000 mL | Freq: Once | INTRAVENOUS | Status: AC
Start: 2015-09-16 — End: 2015-09-16
  Administered 2015-09-16: 1000 mL via INTRAVENOUS

## 2015-09-16 MED ORDER — HYDROMORPHONE HCL 1 MG/ML IJ SOLN
1.0000 mg | Freq: Once | INTRAMUSCULAR | Status: AC
  Administered 2015-09-16: 1 mg via INTRAVENOUS
  Filled 2015-09-16: qty 1

## 2015-09-16 MED ORDER — OXYCODONE-ACETAMINOPHEN 5-325 MG PO TABS: 1.0000 | ORAL_TABLET | Freq: Four times a day (QID) | ORAL | Status: DC | PRN

## 2015-09-16 MED ORDER — DEXTROSE 5 % IV SOLN
1.0000 g | Freq: Once | INTRAVENOUS | Status: AC
  Administered 2015-09-16: 1 g via INTRAVENOUS
  Filled 2015-09-16: qty 10

## 2015-09-16 MED ORDER — IBUPROFEN 400 MG PO TABS: 400.0000 mg | ORAL_TABLET | Freq: Three times a day (TID) | ORAL | Status: DC

## 2015-09-16 MED ORDER — ONDANSETRON HCL 4 MG PO TABS: 4.0000 mg | ORAL_TABLET | Freq: Four times a day (QID) | ORAL | Status: DC

## 2015-09-16 MED ORDER — OXYCODONE-ACETAMINOPHEN 5-325 MG PO TABS
1.0000 | ORAL_TABLET | Freq: Once | ORAL | Status: AC
Start: 2015-09-16 — End: 2015-09-16
  Administered 2015-09-16: 1 via ORAL
  Filled 2015-09-16: qty 1

## 2015-09-16 MED ORDER — KETOROLAC TROMETHAMINE 30 MG/ML IJ SOLN
15.0000 mg | Freq: Once | INTRAMUSCULAR | Status: AC
  Administered 2015-09-16: 15 mg via INTRAVENOUS
  Filled 2015-09-16: qty 1

## 2015-09-16 MED ORDER — ONDANSETRON HCL 4 MG/2ML IJ SOLN
4.0000 mg | Freq: Once | INTRAMUSCULAR | Status: AC
  Administered 2015-09-16: 4 mg via INTRAVENOUS
  Filled 2015-09-16: qty 2

## 2015-09-16 MED ORDER — CEPHALEXIN 500 MG PO CAPS: 500.0000 mg | ORAL_CAPSULE | Freq: Two times a day (BID) | ORAL | Status: DC

## 2015-09-16 NOTE — Discharge Instructions (Signed)
Kidney Stones Take the pain medication and antibiotics. Follow up with the urologist. Return to the ED if you develop fever, vomiting, uncontrolled pain or any other concerns. Kidney stones (urolithiasis) are deposits that form inside your kidneys. The intense pain is caused by the stone moving through the urinary tract. When the stone moves, the ureter goes into spasm around the stone. The stone is usually passed in the urine.  CAUSES   A disorder that makes certain neck glands produce too much parathyroid hormone (primary hyperparathyroidism).  A buildup of uric acid crystals, similar to gout in your joints.  Narrowing (stricture) of the ureter.  A kidney obstruction present at birth (congenital obstruction).  Previous surgery on the kidney or ureters.  Numerous kidney infections. SYMPTOMS   Feeling sick to your stomach (nauseous).  Throwing up (vomiting).  Blood in the urine (hematuria).  Pain that usually spreads (radiates) to the groin.  Frequency or urgency of urination. DIAGNOSIS   Taking a history and physical exam.  Blood or urine tests.  CT scan.  Occasionally, an examination of the inside of the urinary bladder (cystoscopy) is performed. TREATMENT   Observation.  Increasing your fluid intake.  Extracorporeal shock wave lithotripsy--This is a noninvasive procedure that uses shock waves to break up kidney stones.  Surgery may be needed if you have severe pain or persistent obstruction. There are various surgical procedures. Most of the procedures are performed with the use of small instruments. Only small incisions are needed to accommodate these instruments, so recovery time is minimized. The size, location, and chemical composition are all important variables that will determine the proper choice of action for you. Talk to your health care provider to better understand your situation so that you will minimize the risk of injury to yourself and your kidney.    HOME CARE INSTRUCTIONS   Drink enough water and fluids to keep your urine clear or pale yellow. This will help you to pass the stone or stone fragments.  Strain all urine through the provided strainer. Keep all particulate matter and stones for your health care provider to see. The stone causing the pain may be as small as a grain of salt. It is very important to use the strainer each and every time you pass your urine. The collection of your stone will allow your health care provider to analyze it and verify that a stone has actually passed. The stone analysis will often identify what you can do to reduce the incidence of recurrences.  Only take over-the-counter or prescription medicines for pain, discomfort, or fever as directed by your health care provider.  Keep all follow-up visits as told by your health care provider. This is important.  Get follow-up X-rays if required. The absence of pain does not always mean that the stone has passed. It may have only stopped moving. If the urine remains completely obstructed, it can cause loss of kidney function or even complete destruction of the kidney. It is your responsibility to make sure X-rays and follow-ups are completed. Ultrasounds of the kidney can show blockages and the status of the kidney. Ultrasounds are not associated with any radiation and can be performed easily in a matter of minutes.  Make changes to your daily diet as told by your health care provider. You may be told to:  Limit the amount of salt that you eat.  Eat 5 or more servings of fruits and vegetables each day.  Limit the amount of meat, poultry, fish,  and eggs that you eat.  Collect a 24-hour urine sample as told by your health care provider.You may need to collect another urine sample every 6-12 months. SEEK MEDICAL CARE IF:  You experience pain that is progressive and unresponsive to any pain medicine you have been prescribed. SEEK IMMEDIATE MEDICAL CARE IF:    Pain cannot be controlled with the prescribed medicine.  You have a fever or shaking chills.  The severity or intensity of pain increases over 18 hours and is not relieved by pain medicine.  You develop a new onset of abdominal pain.  You feel faint or pass out.  You are unable to urinate.   This information is not intended to replace advice given to you by your health care provider. Make sure you discuss any questions you have with your health care provider.   Document Released: 03/04/2005 Document Revised: 11/23/2014 Document Reviewed: 08/05/2012 Elsevier Interactive Patient Education [employer].

## 2015-09-16 NOTE — ED Provider Notes (Signed)
CSN: [id number]     Arrival date & time 09/16/15  1855 History   First MD Initiated Contact with Patient 09/16/15 1912     Chief Complaint  Patient presents with  . Flank Pain     (Consider location/radiation/quality/duration/timing/severity/associated sxs/prior Treatment) HPI Comments: Patient with acute onset of right-sided abdominal pain and flank pain at 3 PM. Pain is constant, nothing makes it better or worse. Took Vicodin at home without relief. Feels similar to previous kidney stones. Has had 3 or 4 episodes of vomiting. Denies fever. Denies pain in testicles. Denies dysuria hematuria. Denies chest pain or shortness of breath. Has needed lithotripsy in the past. No other abdominal surgeries.  Patient is a 75 y.o. male presenting with flank pain. The history is provided by the patient and a relative.  Flank Pain Pertinent negatives include no chest pain, no abdominal pain, no headaches and no shortness of breath.    Past Medical History  Diagnosis Date  . Coronary artery disease     Coronary stent 1997, Last cath 1998, Last Nuc 10/24/2011-no change from previous, Last Echo 2011 EF 45%  . Diabetes mellitus     medical treatment x 10 years  . Chronic kidney disease     [phone] kidney stones  . Myocardial infarction [facility]) 1997    acute Ant MI  . Hyperlipidemia     lipomet analysis LDL particle # at 786  . Nephrolithiasis 11/2011   Past Surgical History  Procedure Laterality Date  . Back surgery  1998  . Stent  1998    coronary stent  . Colonoscopy  2004    By Dr.Rehman at [facility]  . Colonoscopy with esophagogastroduodenoscopy (egd)  01/24/2012    Procedure: COLONOSCOPY WITH ESOPHAGOGASTRODUODENOSCOPY (EGD);  Surgeon: Rogene Houston, MD;  [facility];  Service: Endoscopy;  Laterality: N/A;  955  . Balloon dilation  01/24/2012    Procedure: BALLOON DILATION;  Surgeon: Rogene Houston, MD;  [facility];  Service: Endoscopy;  Laterality: N/A;  Venia Minks dilation  01/24/2012    Procedure: Venia Minks DILATION;  Surgeon: Rogene Houston, MD;  [facility];  Service: Endoscopy;  Laterality: N/A;  . Savory dilation  01/24/2012    Procedure: SAVORY DILATION;  Surgeon: Rogene Houston, MD;  [facility];  Service: Endoscopy;  Laterality: N/A;  . Coronary stent placement  09/15/1995    PTCA/Stent Palmaz-schatz stent to prox. LAD for ant MI  . Coronary angioplasty  06/08/1996    PTCA to prox. LAD for in-stent restenosis, residual disease of 30-50% and 40-50% narrowings in his RCA and ectasia of LCX.   Family History  Problem Relation Age of Onset  . Heart disease Mother   . Heart disease Father   . Healthy Son   . Healthy Son    Social History  Substance Use Topics  . Smoking status: Never Smoker   . Smokeless tobacco: Never Used     Comment: patient's states t hat he smokes his pipe in the summer time only  . Alcohol Use: No    Review of Systems  Constitutional: Positive for activity change and appetite change. Negative for fever.  HENT: Negative for congestion and voice change.   Eyes: Negative for visual disturbance.  Respiratory: Negative for cough, chest tightness and shortness of breath.   Cardiovascular: Negative for chest pain.  Gastrointestinal: Positive for nausea and vomiting. Negative for abdominal pain.  Genitourinary: Positive for flank  pain and difficulty urinating. Negative for dysuria, hematuria and testicular pain.  Musculoskeletal: Negative for myalgias and arthralgias.  Skin: Negative for rash.  Neurological: Negative for dizziness, weakness and headaches.  A complete 10 system review of systems was obtained and all systems are negative except as noted in the HPI and PMH.      Allergies  Review of patient's allergies indicates no known allergies.  Home Medications   Prior to Admission medications   Medication Sig Start Date End Date Taking? Authorizing Provider  aspirin 81 MG tablet  Take 81 mg by mouth daily.   Yes Historical Provider, MD  carvedilol (COREG) 3.125 MG tablet Take 1 tablet by mouth two  times daily 01/04/15  Yes Troy Sine, MD  Insulin Lispro Prot & Lispro (HUMALOG MIX 75/25 KWIKPEN Manuel Garcia) Inject 30 Units into the skin 2 (two) times daily before a meal.   Yes Historical Provider, MD  lisinopril (PRINIVIL,ZESTRIL) 10 MG tablet Take 0.5 tablets by mouth daily. 01/18/15  Yes Historical Provider, MD  potassium citrate (UROCIT-K) 10 MEQ (1080 MG) SR tablet Take 10 mEq by mouth 2 (two) times daily.   Yes Historical Provider, MD  rosuvastatin (CRESTOR) 40 MG tablet Take 40 mg by mouth daily.   Yes Historical Provider, MD   BP 167/83 mmHg  Pulse 76  Temp(Src) 97.6 F (36.4 C) (Temporal)  Resp 18  Ht 5\' 8"  (1.727 m)  Wt 196 lb (88.905 kg)  BMI 29.81 kg/m2  SpO2 99% Physical Exam  Constitutional: He is oriented to person, place, and time. He appears well-developed and well-nourished. No distress.  HENT:  Head: Normocephalic and atraumatic.  Mouth/Throat: Oropharynx is clear and moist. No oropharyngeal exudate.  Eyes: Conjunctivae and EOM are normal. Pupils are equal, round, and reactive to light.  Neck: Normal range of motion. Neck supple.  No meningismus.  Cardiovascular: Normal rate, regular rhythm, normal heart sounds and intact distal pulses.   No murmur heard. Pulmonary/Chest: Effort normal and breath sounds normal. No respiratory distress.  Abdominal: Soft. There is tenderness. There is no rebound and no guarding.  TTP R upper and lower abdomen. No guarding or rebound  Genitourinary:  No testicular pain  Musculoskeletal: Normal range of motion. He exhibits no edema or tenderness.  No CVAT  Neurological: He is alert and oriented to person, place, and time. No cranial nerve deficit. He exhibits normal muscle tone. Coordination normal.  No ataxia on finger to nose bilaterally. No pronator drift. 5/5 strength throughout. CN 2-12 intact.Equal grip  strength. Sensation intact.   Skin: Skin is warm.  Psychiatric: He has a normal mood and affect. His behavior is normal.  Nursing note and vitals reviewed.   ED Course  Procedures (including critical care time) Labs Review Labs Reviewed  URINALYSIS, ROUTINE W REFLEX MICROSCOPIC (NOT AT [facility]) - Abnormal; Notable for the following:    APPearance TURBID (*)    Glucose, UA >1000 (*)    Hgb urine dipstick LARGE (*)    Ketones, ur TRACE (*)    Protein, ur 100 (*)    All other components within normal limits  CBC WITH DIFFERENTIAL/PLATELET - Abnormal; Notable for the following:    Platelets 123 (*)    All other components within normal limits  COMPREHENSIVE METABOLIC PANEL - Abnormal; Notable for the following:    Glucose, Bld 271 (*)    BUN 30 (*)    Creatinine, Ser 2.01 (*)    GFR calc non Af Amer 31 (*)  GFR calc Af Amer 36 (*)    All other components within normal limits  URINE MICROSCOPIC-ADD ON - Abnormal; Notable for the following:    Squamous Epithelial / LPF 0-5 (*)    Bacteria, UA MANY (*)    All other components within normal limits  URINE CULTURE  LIPASE, BLOOD    Imaging Review Ct Renal Stone Study  09/16/2015  CLINICAL DATA:  Right flank pain beginning this afternoon. History of kidney stones. Vomiting. EXAM: CT ABDOMEN AND PELVIS WITHOUT CONTRAST TECHNIQUE: Multidetector CT imaging of the abdomen and pelvis was performed following the standard protocol without IV contrast. COMPARISON:  11/15/2011 FINDINGS: Lower chest: Lung bases are within normal. There is moderate calcified plaque over the left anterior descending and right coronary arteries. Hepatobiliary: No mass visualized on this un-enhanced exam. Pancreas: No mass or inflammatory process identified on this un-enhanced exam. Spleen: Within normal limits in size. Adrenals/Urinary Tract: Adrenal glands are normal. Kidneys are normal in size. There are several small bilateral renal cysts. No left renal stones. 1.2 cm  stone over the right renal pelvis with mild dilatation of the right intrarenal collecting system. Ureters are otherwise within normal. Bladder is normal. Stomach/Bowel: No evidence of obstruction, inflammatory process, or abnormal fluid collections. Appendix is normal. There is mild diverticulosis of the colon. Vascular/Lymphatic: No pathologically enlarged lymph nodes. No evidence of abdominal aortic aneurysm. Calcified plaque over the abdominal aorta and iliac arteries. Reproductive: Mild prominence of the prostate gland. Other: Mesentery is within normal without inflammatory change or free fluid. Musculoskeletal:  Degenerative change of the spine and hips. IMPRESSION: 1.2 cm stone over the right renal pelvis causing low-grade obstruction. Atherosclerotic coronary artery disease.  Aortic atherosclerosis. Mild diverticulosis of the colon. Bilateral renal cysts. Electronically Signed   By: Marin Olp M.D.   On: 09/16/2015 20:40   I have personally reviewed and evaluated these images and lab results as part of my medical decision-making.   EKG Interpretation None      MDM   Final diagnoses:  Flank pain   Flank pain with nausea and vomiting similar to previous kidney stones.  CT shows 1.2 cm proximal R ureteral stone. Cr 2 which is near baseline. UA with bacteria, red cells, white cells.  Rocephin given.  Urine culture sent.  D/w Dr. Karsten Ro of urology.  Agrees with outpatient followup.  Patient afebrile.  No leukocytosis. Urine culture sent, rocephin given.  Patient feels improved. Pain is resolved. No further vomiting. Discussed results with patient the need for urology follow-up. Return to the ED sooner with fever, uncontrolled pain, vomiting or any other concerns.  Ezequiel Essex, MD 09/17/15 0001

## 2015-09-16 NOTE — ED Notes (Signed)
Patient was given a prepackage of six Percocet 5/325 mg and given instructions on use, patient verbally understands.

## 2015-09-16 NOTE — ED Notes (Signed)
Pt states he has been having right flank pain since about 3pm.  States feels like kidney stones in the past.  Also having vomiting.

## 2015-09-18 MED FILL — Hydrocodone-Acetaminophen Tab 5-325 MG: ORAL | Qty: 6 | Status: AC

## 2015-09-19 LAB — URINE CULTURE

## 2015-09-26 DIAGNOSIS — L570 Actinic keratosis: Secondary | ICD-10-CM | POA: Diagnosis not present

## 2015-10-10 DIAGNOSIS — H35372 Puckering of macula, left eye: Secondary | ICD-10-CM | POA: Diagnosis not present

## 2015-10-17 ENCOUNTER — Ambulatory Visit (HOSPITAL_COMMUNITY)
Admission: RE | Admit: 2015-10-17 | Discharge: 2015-10-17 | Disposition: A | Discharge status: Home / Self Care | Payer: Medicare Other | Source: Ambulatory Visit | Attending: Family Medicine | Admitting: Family Medicine

## 2015-10-17 ENCOUNTER — Other Ambulatory Visit (HOSPITAL_COMMUNITY): Payer: Self-pay | Admitting: Family Medicine

## 2015-10-17 ENCOUNTER — Other Ambulatory Visit (HOSPITAL_COMMUNITY)
Admission: RE | Admit: 2015-10-17 | Discharge: 2015-10-17 | Disposition: A | Discharge status: Home / Self Care | Payer: Medicare Other | Source: Ambulatory Visit | Attending: Urology | Admitting: Urology

## 2015-10-17 ENCOUNTER — Ambulatory Visit (INDEPENDENT_AMBULATORY_CARE_PROVIDER_SITE_OTHER): Payer: Medicare Other | Admitting: Urology

## 2015-10-17 DIAGNOSIS — N200 Calculus of kidney: Secondary | ICD-10-CM | POA: Diagnosis not present

## 2015-10-17 DIAGNOSIS — N202 Calculus of kidney with calculus of ureter: Secondary | ICD-10-CM | POA: Insufficient documentation

## 2015-10-23 ENCOUNTER — Other Ambulatory Visit: Payer: Self-pay | Admitting: Urology

## 2015-10-25 ENCOUNTER — Encounter (HOSPITAL_COMMUNITY): Payer: Self-pay | Admitting: *Deleted

## 2015-10-30 ENCOUNTER — Ambulatory Visit (HOSPITAL_COMMUNITY)
Admission: RE | Admit: 2015-10-30 | Discharge: 2015-10-30 | Disposition: A | Discharge status: Home / Self Care | Payer: Medicare Other | Source: Ambulatory Visit | Attending: Urology | Admitting: Urology

## 2015-10-30 ENCOUNTER — Ambulatory Visit (HOSPITAL_COMMUNITY): Payer: Medicare Other

## 2015-10-30 ENCOUNTER — Encounter (HOSPITAL_COMMUNITY): Payer: Self-pay | Admitting: General Practice

## 2015-10-30 ENCOUNTER — Encounter (HOSPITAL_COMMUNITY)
Admission: RE | Disposition: A | Discharge status: Home / Self Care | Payer: Self-pay | Source: Ambulatory Visit | Attending: Urology

## 2015-10-30 DIAGNOSIS — E119 Type 2 diabetes mellitus without complications: Secondary | ICD-10-CM | POA: Diagnosis not present

## 2015-10-30 DIAGNOSIS — N200 Calculus of kidney: Secondary | ICD-10-CM | POA: Insufficient documentation

## 2015-10-30 DIAGNOSIS — Z955 Presence of coronary angioplasty implant and graft: Secondary | ICD-10-CM | POA: Diagnosis not present

## 2015-10-30 LAB — STONE ANALYSIS
CA OXALATE, MONOHYDR.: 97 %
Ca phos cry stone ql IR: 3 %
STONE WEIGHT KSTONE: 2 mg

## 2015-10-30 LAB — GLUCOSE, CAPILLARY: Glucose-Capillary: 224 mg/dL — ABNORMAL HIGH (ref 65–99)

## 2015-10-30 SURGERY — LITHOTRIPSY, ESWL
Anesthesia: LOCAL | Laterality: Right

## 2015-10-30 MED ORDER — SODIUM CHLORIDE 0.9 % IV SOLN
INTRAVENOUS | Status: DC
  Administered 2015-10-30: 08:00:00 via INTRAVENOUS

## 2015-10-30 MED ORDER — LEVOFLOXACIN 500 MG PO TABS
500.0000 mg | ORAL_TABLET | ORAL | Status: AC
  Administered 2015-10-30: 500 mg via ORAL
  Filled 2015-10-30: qty 1

## 2015-10-30 MED ORDER — DIAZEPAM 5 MG PO TABS
10.0000 mg | ORAL_TABLET | ORAL | Status: AC
  Administered 2015-10-30: 10 mg via ORAL
  Filled 2015-10-30: qty 2

## 2015-10-30 MED ORDER — DIPHENHYDRAMINE HCL 25 MG PO CAPS
25.0000 mg | ORAL_CAPSULE | ORAL | Status: AC
  Administered 2015-10-30: 25 mg via ORAL
  Filled 2015-10-30: qty 1

## 2015-11-16 DIAGNOSIS — E7800 Pure hypercholesterolemia, unspecified: Secondary | ICD-10-CM | POA: Diagnosis not present

## 2015-11-16 DIAGNOSIS — E1165 Type 2 diabetes mellitus with hyperglycemia: Secondary | ICD-10-CM | POA: Diagnosis not present

## 2015-11-16 DIAGNOSIS — I10 Essential (primary) hypertension: Secondary | ICD-10-CM | POA: Diagnosis not present

## 2015-11-16 DIAGNOSIS — N189 Chronic kidney disease, unspecified: Secondary | ICD-10-CM | POA: Diagnosis not present

## 2015-11-17 ENCOUNTER — Other Ambulatory Visit: Payer: Self-pay | Admitting: Urology

## 2015-11-17 ENCOUNTER — Ambulatory Visit (HOSPITAL_COMMUNITY)
Admission: RE | Admit: 2015-11-17 | Discharge: 2015-11-17 | Disposition: A | Discharge status: Home / Self Care | Payer: Medicare Other | Source: Ambulatory Visit | Attending: Urology | Admitting: Urology

## 2015-11-17 DIAGNOSIS — N200 Calculus of kidney: Secondary | ICD-10-CM

## 2015-11-17 DIAGNOSIS — N2889 Other specified disorders of kidney and ureter: Secondary | ICD-10-CM | POA: Diagnosis not present

## 2015-11-21 ENCOUNTER — Ambulatory Visit (INDEPENDENT_AMBULATORY_CARE_PROVIDER_SITE_OTHER): Payer: Self-pay | Admitting: Urology

## 2015-11-21 ENCOUNTER — Other Ambulatory Visit (HOSPITAL_COMMUNITY)
Admission: RE | Admit: 2015-11-21 | Discharge: 2015-11-21 | Disposition: A | Discharge status: Home / Self Care | Payer: Medicare Other | Source: Other Acute Inpatient Hospital | Attending: Urology | Admitting: Urology

## 2015-11-21 DIAGNOSIS — N200 Calculus of kidney: Secondary | ICD-10-CM

## 2015-11-29 DIAGNOSIS — N200 Calculus of kidney: Secondary | ICD-10-CM | POA: Diagnosis not present

## 2015-12-01 LAB — STONE ANALYSIS
CA OXALATE, MONOHYDR.: 90 %
CA PHOS CRY STONE QL IR: 10 %
STONE WEIGHT KSTONE: 20 mg

## 2015-12-08 ENCOUNTER — Other Ambulatory Visit: Payer: Self-pay | Admitting: Urology

## 2015-12-27 ENCOUNTER — Encounter (HOSPITAL_BASED_OUTPATIENT_CLINIC_OR_DEPARTMENT_OTHER): Payer: Self-pay | Admitting: *Deleted

## 2015-12-27 NOTE — Progress Notes (Signed)
NPO AFTER MN.  ARRIVE AT 0745.  NEEDS ISTAT 8 .  CURRENT EKG IN CHART AND EPIC.  WILL TAKE COREG AM DOS W/ SIPS OF WATER AND HALF INSULIN DOSE HS BEFORE DOS.

## 2016-01-01 ENCOUNTER — Ambulatory Visit (HOSPITAL_BASED_OUTPATIENT_CLINIC_OR_DEPARTMENT_OTHER)
Admission: RE | Admit: 2016-01-01 | Discharge: 2016-01-01 | Disposition: A | Discharge status: Home / Self Care | Payer: Medicare Other | Source: Ambulatory Visit | Attending: Urology | Admitting: Urology

## 2016-01-01 ENCOUNTER — Ambulatory Visit (HOSPITAL_BASED_OUTPATIENT_CLINIC_OR_DEPARTMENT_OTHER): Payer: Medicare Other | Admitting: Anesthesiology

## 2016-01-01 ENCOUNTER — Encounter (HOSPITAL_BASED_OUTPATIENT_CLINIC_OR_DEPARTMENT_OTHER): Payer: Self-pay | Admitting: *Deleted

## 2016-01-01 ENCOUNTER — Encounter (HOSPITAL_BASED_OUTPATIENT_CLINIC_OR_DEPARTMENT_OTHER)
Admission: RE | Disposition: A | Discharge status: Home / Self Care | Payer: Self-pay | Source: Ambulatory Visit | Attending: Urology

## 2016-01-01 DIAGNOSIS — Z87891 Personal history of nicotine dependence: Secondary | ICD-10-CM | POA: Insufficient documentation

## 2016-01-01 DIAGNOSIS — Z794 Long term (current) use of insulin: Secondary | ICD-10-CM | POA: Diagnosis not present

## 2016-01-01 DIAGNOSIS — K219 Gastro-esophageal reflux disease without esophagitis: Secondary | ICD-10-CM | POA: Insufficient documentation

## 2016-01-01 DIAGNOSIS — I252 Old myocardial infarction: Secondary | ICD-10-CM | POA: Insufficient documentation

## 2016-01-01 DIAGNOSIS — Z85828 Personal history of other malignant neoplasm of skin: Secondary | ICD-10-CM | POA: Diagnosis not present

## 2016-01-01 DIAGNOSIS — Z9842 Cataract extraction status, left eye: Secondary | ICD-10-CM | POA: Diagnosis not present

## 2016-01-01 DIAGNOSIS — I129 Hypertensive chronic kidney disease with stage 1 through stage 4 chronic kidney disease, or unspecified chronic kidney disease: Secondary | ICD-10-CM | POA: Diagnosis not present

## 2016-01-01 DIAGNOSIS — N132 Hydronephrosis with renal and ureteral calculous obstruction: Secondary | ICD-10-CM | POA: Insufficient documentation

## 2016-01-01 DIAGNOSIS — Z955 Presence of coronary angioplasty implant and graft: Secondary | ICD-10-CM | POA: Diagnosis not present

## 2016-01-01 DIAGNOSIS — Z8249 Family history of ischemic heart disease and other diseases of the circulatory system: Secondary | ICD-10-CM | POA: Insufficient documentation

## 2016-01-01 DIAGNOSIS — N200 Calculus of kidney: Secondary | ICD-10-CM | POA: Diagnosis not present

## 2016-01-01 DIAGNOSIS — Z86010 Personal history of colonic polyps: Secondary | ICD-10-CM | POA: Insufficient documentation

## 2016-01-01 DIAGNOSIS — E1122 Type 2 diabetes mellitus with diabetic chronic kidney disease: Secondary | ICD-10-CM | POA: Diagnosis not present

## 2016-01-01 DIAGNOSIS — Z961 Presence of intraocular lens: Secondary | ICD-10-CM | POA: Diagnosis not present

## 2016-01-01 DIAGNOSIS — E785 Hyperlipidemia, unspecified: Secondary | ICD-10-CM | POA: Diagnosis not present

## 2016-01-01 DIAGNOSIS — I452 Bifascicular block: Secondary | ICD-10-CM | POA: Insufficient documentation

## 2016-01-01 DIAGNOSIS — I10 Essential (primary) hypertension: Secondary | ICD-10-CM | POA: Diagnosis not present

## 2016-01-01 DIAGNOSIS — Z9841 Cataract extraction status, right eye: Secondary | ICD-10-CM | POA: Insufficient documentation

## 2016-01-01 DIAGNOSIS — I2510 Atherosclerotic heart disease of native coronary artery without angina pectoris: Secondary | ICD-10-CM | POA: Insufficient documentation

## 2016-01-01 DIAGNOSIS — N183 Chronic kidney disease, stage 3 (moderate): Secondary | ICD-10-CM | POA: Insufficient documentation

## 2016-01-01 HISTORY — DX: Chronic kidney disease, stage 3 unspecified: N18.30

## 2016-01-01 HISTORY — DX: Other specified postprocedural states: Z98.890

## 2016-01-01 HISTORY — DX: Gastro-esophageal reflux disease without esophagitis: K21.9

## 2016-01-01 HISTORY — DX: Bifascicular block: I45.2

## 2016-01-01 HISTORY — DX: Presence of coronary angioplasty implant and graft: Z95.5

## 2016-01-01 HISTORY — DX: Presence of external hearing-aid: Z97.4

## 2016-01-01 HISTORY — PX: HOLMIUM LASER APPLICATION: SHX5852

## 2016-01-01 HISTORY — DX: Calculus of kidney: N20.0

## 2016-01-01 HISTORY — DX: Other specified postprocedural states: Z85.828

## 2016-01-01 HISTORY — DX: Chronic kidney disease, stage 3 (moderate): N18.3

## 2016-01-01 HISTORY — PX: CYSTOSCOPY WITH STENT PLACEMENT: SHX5790

## 2016-01-01 HISTORY — DX: Old myocardial infarction: I25.2

## 2016-01-01 HISTORY — DX: Presence of spectacles and contact lenses: Z97.3

## 2016-01-01 HISTORY — DX: Reserved for concepts with insufficient information to code with codable children: IMO0002

## 2016-01-01 HISTORY — DX: Type 2 diabetes mellitus with hyperglycemia: E11.65

## 2016-01-01 HISTORY — DX: Personal history of colonic polyps: Z86.010

## 2016-01-01 HISTORY — DX: Personal history of adenomatous and serrated colon polyps: Z86.0101

## 2016-01-01 HISTORY — DX: Cyst of kidney, acquired: N28.1

## 2016-01-01 HISTORY — PX: CYSTOSCOPY/RETROGRADE/URETEROSCOPY/STONE EXTRACTION WITH BASKET: SHX5317

## 2016-01-01 LAB — POCT I-STAT, CHEM 8
BUN: 30 mg/dL — ABNORMAL HIGH (ref 6–20)
CREATININE: 1.8 mg/dL — AB (ref 0.61–1.24)
Calcium, Ion: 1.25 mmol/L (ref 1.15–1.40)
Chloride: 105 mmol/L (ref 101–111)
Glucose, Bld: 151 mg/dL — ABNORMAL HIGH (ref 65–99)
HEMATOCRIT: 48 % (ref 39.0–52.0)
HEMOGLOBIN: 16.3 g/dL (ref 13.0–17.0)
POTASSIUM: 4.5 mmol/L (ref 3.5–5.1)
SODIUM: 140 mmol/L (ref 135–145)
TCO2: 24 mmol/L (ref 0–100)

## 2016-01-01 LAB — GLUCOSE, CAPILLARY: GLUCOSE-CAPILLARY: 149 mg/dL — AB (ref 65–99)

## 2016-01-01 SURGERY — CYSTOSCOPY, WITH CALCULUS REMOVAL USING BASKET
Anesthesia: General | Site: Ureter | Laterality: Right

## 2016-01-01 MED ORDER — OXYCODONE HCL 5 MG PO TABS
5.0000 mg | ORAL_TABLET | Freq: Once | ORAL | Status: AC | PRN
  Administered 2016-01-01: 5 mg via ORAL
  Filled 2016-01-01: qty 1

## 2016-01-01 MED ORDER — OXYBUTYNIN CHLORIDE 5 MG PO TABS
5.0000 mg | ORAL_TABLET | Freq: Three times a day (TID) | ORAL | Status: DC
  Administered 2016-01-01: 5 mg via ORAL
  Filled 2016-01-01: qty 1

## 2016-01-01 MED ORDER — PROPOFOL 10 MG/ML IV BOLUS
INTRAVENOUS | Status: DC | PRN
  Administered 2016-01-01: 50 mg via INTRAVENOUS
  Administered 2016-01-01: 150 mg via INTRAVENOUS

## 2016-01-01 MED ORDER — ONDANSETRON HCL 4 MG/2ML IJ SOLN
INTRAMUSCULAR | Status: AC
  Filled 2016-01-01: qty 2

## 2016-01-01 MED ORDER — CEFAZOLIN SODIUM-DEXTROSE 2-4 GM/100ML-% IV SOLN
2.0000 g | INTRAVENOUS | Status: AC
  Administered 2016-01-01: 2 g via INTRAVENOUS
  Filled 2016-01-01: qty 100

## 2016-01-01 MED ORDER — OXYBUTYNIN CHLORIDE 5 MG PO TABS: 5.0000 mg | ORAL_TABLET | Freq: Three times a day (TID) | ORAL | 1 refills | Status: DC

## 2016-01-01 MED ORDER — BELLADONNA ALKALOIDS-OPIUM 16.2-60 MG RE SUPP
RECTAL | Status: AC
Start: 2016-01-01 — End: 2016-01-01
  Filled 2016-01-01: qty 1

## 2016-01-01 MED ORDER — OXYBUTYNIN CHLORIDE 5 MG PO TABS
ORAL_TABLET | ORAL | Status: AC
  Filled 2016-01-01: qty 1

## 2016-01-01 MED ORDER — SODIUM CHLORIDE 0.9 % IV SOLN
INTRAVENOUS | Status: DC
  Administered 2016-01-01 (×2): via INTRAVENOUS
  Filled 2016-01-01: qty 1000

## 2016-01-01 MED ORDER — FENTANYL CITRATE (PF) 100 MCG/2ML IJ SOLN
25.0000 ug | INTRAMUSCULAR | Status: DC | PRN
  Administered 2016-01-01 (×2): 25 ug via INTRAVENOUS
  Filled 2016-01-01: qty 1

## 2016-01-01 MED ORDER — LIDOCAINE 2% (20 MG/ML) 5 ML SYRINGE
INTRAMUSCULAR | Status: AC
  Filled 2016-01-01: qty 5

## 2016-01-01 MED ORDER — FENTANYL CITRATE (PF) 100 MCG/2ML IJ SOLN
INTRAMUSCULAR | Status: DC | PRN
  Administered 2016-01-01 (×3): 25 ug via INTRAVENOUS

## 2016-01-01 MED ORDER — SULFAMETHOXAZOLE-TRIMETHOPRIM 800-160 MG PO TABS: 1.0000 | ORAL_TABLET | Freq: Two times a day (BID) | ORAL | 0 refills | Status: DC

## 2016-01-01 MED ORDER — FENTANYL CITRATE (PF) 100 MCG/2ML IJ SOLN
INTRAMUSCULAR | Status: AC
  Filled 2016-01-01: qty 2

## 2016-01-01 MED ORDER — STERILE WATER FOR IRRIGATION IR SOLN
Status: DC | PRN
  Administered 2016-01-01: 3000 mL

## 2016-01-01 MED ORDER — SODIUM CHLORIDE 0.9 % IR SOLN
Status: DC | PRN
  Administered 2016-01-01: 4000 mL

## 2016-01-01 MED ORDER — ONDANSETRON HCL 4 MG/2ML IJ SOLN
INTRAMUSCULAR | Status: DC | PRN
  Administered 2016-01-01: 4 mg via INTRAVENOUS

## 2016-01-01 MED ORDER — DEXAMETHASONE SODIUM PHOSPHATE 10 MG/ML IJ SOLN
INTRAMUSCULAR | Status: AC
  Filled 2016-01-01: qty 1

## 2016-01-01 MED ORDER — IOHEXOL 300 MG/ML  SOLN
INTRAMUSCULAR | Status: DC | PRN
  Administered 2016-01-01: 9 mL

## 2016-01-01 MED ORDER — OXYCODONE HCL 5 MG/5ML PO SOLN
5.0000 mg | Freq: Once | ORAL | Status: AC | PRN
  Filled 2016-01-01: qty 5

## 2016-01-01 MED ORDER — ONDANSETRON HCL 4 MG/2ML IJ SOLN
4.0000 mg | Freq: Once | INTRAMUSCULAR | Status: AC
  Administered 2016-01-01: 4 mg via INTRAVENOUS
  Filled 2016-01-01: qty 2

## 2016-01-01 MED ORDER — CEFAZOLIN IN D5W 1 GM/50ML IV SOLN
1.0000 g | INTRAVENOUS | Status: DC
  Filled 2016-01-01: qty 50

## 2016-01-01 MED ORDER — PROPOFOL 10 MG/ML IV BOLUS
INTRAVENOUS | Status: AC
  Filled 2016-01-01: qty 20

## 2016-01-01 MED ORDER — CEFAZOLIN SODIUM-DEXTROSE 2-4 GM/100ML-% IV SOLN
INTRAVENOUS | Status: AC
  Filled 2016-01-01: qty 100

## 2016-01-01 MED ORDER — LIDOCAINE 2% (20 MG/ML) 5 ML SYRINGE
INTRAMUSCULAR | Status: DC | PRN
  Administered 2016-01-01: 100 mg via INTRAVENOUS

## 2016-01-01 MED ORDER — OXYCODONE HCL 5 MG PO TABS
ORAL_TABLET | ORAL | Status: AC
  Filled 2016-01-01: qty 1

## 2016-01-01 SURGICAL SUPPLY — 40 items
BAG DRAIN URO-CYSTO SKYTR STRL (DRAIN) ×3 IMPLANT
BAG DRN UROCATH (DRAIN) ×2
BASKET DAKOTA 1.9FR 11X120 (BASKET) ×1 IMPLANT
BASKET STNLS GEMINI 4WIRE 3FR (BASKET) IMPLANT
BASKET ZERO TIP NITINOL 2.4FR (BASKET) IMPLANT
BSKT STON RTRVL GEM 120X11 3FR (BASKET)
BSKT STON RTRVL ZERO TP 2.4FR (BASKET)
CATH INTERMIT  6FR 70CM (CATHETERS) ×1 IMPLANT
CATH URET 5FR 28IN CONE TIP (BALLOONS)
CATH URET 5FR 28IN OPEN ENDED (CATHETERS) ×1 IMPLANT
CATH URET 5FR 70CM CONE TIP (BALLOONS) IMPLANT
CLOTH BEACON ORANGE TIMEOUT ST (SAFETY) ×3 IMPLANT
FIBER LASER FLEXIVA 365 (UROLOGICAL SUPPLIES) IMPLANT
FIBER LASER LITHO 273 (Laser) ×1 IMPLANT
FIBER LASER TRAC TIP (UROLOGICAL SUPPLIES) IMPLANT
GLOVE BIO SURGEON STRL SZ8 (GLOVE) ×3 IMPLANT
GLOVE BIOGEL PI IND STRL 7.0 (GLOVE) IMPLANT
GLOVE BIOGEL PI IND STRL 7.5 (GLOVE) IMPLANT
GLOVE BIOGEL PI INDICATOR 7.0 (GLOVE) ×1
GLOVE BIOGEL PI INDICATOR 7.5 (GLOVE) ×1
GLOVE ECLIPSE 7.0 STRL STRAW (GLOVE) ×1 IMPLANT
GOWN STRL REUS W/ TWL LRG LVL3 (GOWN DISPOSABLE) ×2 IMPLANT
GOWN STRL REUS W/ TWL XL LVL3 (GOWN DISPOSABLE) ×2 IMPLANT
GOWN STRL REUS W/TWL LRG LVL3 (GOWN DISPOSABLE) ×3
GOWN STRL REUS W/TWL XL LVL3 (GOWN DISPOSABLE) ×8 IMPLANT
GUIDEWIRE 0.038 PTFE COATED (WIRE) IMPLANT
GUIDEWIRE ANG ZIPWIRE 038X150 (WIRE) IMPLANT
GUIDEWIRE STR DUAL SENSOR (WIRE) ×1 IMPLANT
IV NS 1000ML (IV SOLUTION) ×3
IV NS 1000ML BAXH (IV SOLUTION) IMPLANT
IV NS IRRIG 3000ML ARTHROMATIC (IV SOLUTION) ×3 IMPLANT
KIT ROOM TURNOVER WOR (KITS) ×3 IMPLANT
LASER FIBER DISP (UROLOGICAL SUPPLIES) IMPLANT
MANIFOLD NEPTUNE II (INSTRUMENTS) ×1 IMPLANT
NS IRRIG 500ML POUR BTL (IV SOLUTION) ×1 IMPLANT
PACK CYSTO (CUSTOM PROCEDURE TRAY) ×3 IMPLANT
SHEATH ACCESS URETERAL 38CM (SHEATH) ×1 IMPLANT
STENT URET 6FRX24 CONTOUR (STENTS) ×1 IMPLANT
TUBE CONNECTING 12X1/4 (SUCTIONS) ×2 IMPLANT
WATER STERILE IRR 3000ML UROMA (IV SOLUTION) ×1 IMPLANT

## 2016-01-01 NOTE — Anesthesia Preprocedure Evaluation (Signed)
Anesthesia Evaluation  Patient identified by MRN, date of birth, ID band Patient awake    Reviewed: Allergy & Precautions, NPO status , Patient's Chart, lab work & pertinent test results, reviewed documented beta blocker date and time   History of Anesthesia Complications Negative for: history of anesthetic complications  Airway Mallampati: II  TM Distance: >3 FB Neck ROM: Full    Dental  (+) Teeth Intact, Missing   Pulmonary neg shortness of breath, neg sleep apnea, neg COPD, neg recent URI, former smoker,    breath sounds clear to auscultation       Cardiovascular hypertension, Pt. on medications and Pt. on home beta blockers (-) angina+ CAD and + Cardiac Stents   Rhythm:Regular     Neuro/Psych negative neurological ROS  negative psych ROS   GI/Hepatic Neg liver ROS, GERD  ,  Endo/Other  diabetes, Type 2, Insulin Dependent  Renal/GU Renal InsufficiencyRenal disease     Musculoskeletal   Abdominal   Peds  Hematology negative hematology ROS (+)   Anesthesia Other Findings   Reproductive/Obstetrics                             Anesthesia Physical Anesthesia Plan  ASA: III  Anesthesia Plan: General   Post-op Pain Management:    Induction: Intravenous  Airway Management Planned: Oral ETT and LMA  Additional Equipment: None  Intra-op Plan:   Post-operative Plan: Extubation in OR  Informed Consent: I have reviewed the patients History and Physical, chart, labs and discussed the procedure including the risks, benefits and alternatives for the proposed anesthesia with the patient or authorized representative who has indicated his/her understanding and acceptance.   Dental advisory given  Plan Discussed with: CRNA and Surgeon  Anesthesia Plan Comments:         Anesthesia Quick Evaluation

## 2016-01-01 NOTE — Transfer of Care (Signed)
Immediate Anesthesia Transfer of Care Note  Patient: Maurice Ross  Procedure(s) Performed: Procedure(s): CYSTOSCOPY/RETROGRADE/URETEROSCOPY (Right) HOLMIUM LASER APPLICATION (Right) CYSTOSCOPY WITH STENT PLACEMENT (Right)  Patient Location: PACU  Anesthesia Type:General  Level of Consciousness: awake and oriented  Airway & Oxygen Therapy: Patient Spontanous Breathing and Patient connected to nasal cannula oxygen  Post-op Assessment: Report given to RN  Post vital signs: Reviewed and stable  Last Vitals: 139/77, 16, 12, 99% Vitals:   01/01/16 0758 01/01/16 1008  BP: 128/68 (P) 139/77  Pulse: 68   Resp: 18   Temp: 36.6 C (P) 36.4 C    Last Pain:  Vitals:   01/01/16 0758  TempSrc: Oral      Patients Stated Pain Goal: 6 ([id number])  Complications: No apparent anesthesia complications

## 2016-01-01 NOTE — Anesthesia Procedure Notes (Signed)
Procedure Name: LMA Insertion Date/Time: 01/01/2016 9:04 AM Performed by: Bethena Roys T Pre-anesthesia Checklist: Patient identified, Emergency Drugs available, Suction available and Patient being monitored Patient Re-evaluated:Patient Re-evaluated prior to inductionOxygen Delivery Method: Circle system utilized Preoxygenation: Pre-oxygenation with 100% oxygen Intubation Type: IV induction Ventilation: Mask ventilation without difficulty LMA: LMA inserted LMA Size: 5.0 Number of attempts: 1 Airway Equipment and Method: Bite block Placement Confirmation: positive ETCO2 Tube secured with: Tape Dental Injury: Teeth and Oropharynx as per pre-operative assessment

## 2016-01-01 NOTE — Progress Notes (Signed)
#  18 french foley inserted due to patients inability to urinate large amounts at one time after informing Dr Diona Fanti.  Removal of foley was explained to patient and wife for 01/02/16. Patient tolerated procedure well.  See flow sheet for amt returned and color consistency

## 2016-01-01 NOTE — Anesthesia Postprocedure Evaluation (Signed)
Anesthesia Post Note  Patient: RAKEEM HOWENSTINE  Procedure(s) Performed: Procedure(s) (LRB): CYSTOSCOPY/RETROGRADE/URETEROSCOPY (Right) HOLMIUM LASER APPLICATION (Right) CYSTOSCOPY WITH STENT PLACEMENT (Right)  Patient location during evaluation: PACU Anesthesia Type: General Level of consciousness: awake Pain management: pain level controlled Vital Signs Assessment: post-procedure vital signs reviewed and stable Respiratory status: spontaneous breathing Cardiovascular status: stable Postop Assessment: no signs of nausea or vomiting Anesthetic complications: no    Last Vitals:  Vitals:   01/01/16 0758 01/01/16 1008  BP: 128/68 139/77  Pulse: 68   Resp: 18   Temp: 36.6 C 36.4 C    Last Pain:  Vitals:   01/01/16 1250  TempSrc:   PainSc: 6                  Nanako Stopher

## 2016-01-01 NOTE — H&P (Signed)
Urology History and Physical Exam  CC: Right sided kidney stone  HPI: 75 year old male presents for ureteroscopic management of right renal calculi. He underwent ESWL of a 13 mm right renal pelvic stone on 8/14, with fragmentation but inadequate passage of multiple fragments. We discussed management of this residual stone burden, and I recommended that he have URS and HLL with extraction of fragments. He understands the risks/complications of the procedure, including inability to access the kidney with placement of a stent and repeat procedure, ureteral injury, infection, anesthetic complication, bleeding, stent dicomfort. He agrees to proceed.  PMH: Past Medical History:  Diagnosis Date  . CKD (chronic kidney disease), stage III   . Coronary artery disease CARDIOLOGIST-  DR Shelva Majestic   Coronary stent 1997, Last cath 1998, Last Nuc 10/24/2011-no change from previous, Last Echo 2011 EF 45%  . Diabetes mellitus type 2, uncontrolled ([place])    per pt last A1c 8.8 in Aug 2017  . GERD (gastroesophageal reflux disease)   . History of acute anterior wall MI    1997  . History of adenomatous polyp of colon    adenomatous 2004;   tubular adenoma 2013  . History of basal cell carcinoma excision    05-24-2015  left cheek  . History of esophageal dilatation   . Hyperlipidemia    lipomet analysis LDL particle # at 786  . 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 Renal calculus, right   . Renal cyst   . S/P coronary artery stent placement    1997  --  PCI  and stenting to LAD  . Wears glasses   . Wears hearing aid    bilateral    PSH: Past Surgical History:  Procedure Laterality Date  . BALLOON DILATION  01/24/2012   Procedure: BALLOON DILATION;  Surgeon: Rogene Houston, MD;  [facility];  Service: Endoscopy;  Laterality: N/A;  . CARDIOVASCULAR STRESS TEST  10-24-2011   dr Claiborne Billings   Low risk nuclear study w/ mild to moderate perfustion defect due to  infarct/scar w/  mild perinfarct ischemia in the apical septal and apical regions/  normal LV function and mild hypokinesis in the apical and apical septal (no sign. change compared to previous study()  . CATARACT EXTRACTION W/ INTRAOCULAR LENS  IMPLANT, BILATERAL  2015  . COLONOSCOPY WITH ESOPHAGOGASTRODUODENOSCOPY (EGD)  01/24/2012   Procedure: COLONOSCOPY WITH ESOPHAGOGASTRODUODENOSCOPY (EGD);  Surgeon: Rogene Houston, MD;  [facility];  Service: Endoscopy;  Laterality: N/A;  955  . CORONARY ANGIOPLASTY  06/08/1996   dr Shelva Majestic   Balloon angioplasty to  pLAD for in-stent restenosis, no change mLAD 30-50%/  narrowing  midRCA 40-50%/  mild ectasia of LCX w/ 30-50%  proximal and marginal narrowing/  mild LV dysfunction w/ anterolateral hypocontractility  . CORONARY ANGIOPLASTY WITH STENT PLACEMENT  09-15-1995    dr Claiborne Billings   PTCA/ Stenting (palmaz-schatz)  to proximal LAD  . EXTRACORPOREAL SHOCK WAVE LITHOTRIPSY  10-30-2015;   11-25-2011  . LUMBAR MICRODISCECTOMY  1998   L5 -- S1  . MALONEY DILATION  01/24/2012   Procedure: MALONEY DILATION;  Surgeon: Rogene Houston, MD;  [facility];  Service: Endoscopy;  Laterality: N/A;  . SAVORY DILATION  01/24/2012   Procedure: SAVORY DILATION;  Surgeon: Rogene Houston, MD;  [facility];  Service: Endoscopy;  Laterality: N/A;  . TRANSTHORACIC ECHOCARDIOGRAM  01/25/2014   mild concentric LVH,  ef Q000111Q, grade 1 diastolic dysfunction/  mild PR    Allergies: No Known Allergies  Medications: No prescriptions prior to admission.     Social History: Social History   Social History  . Marital status: Married    Spouse name: Blanch Media  . Number of children: 2  . Years of education: N/A   Occupational History  . retired Engineer, manufacturing systems    Social History Main Topics  . Smoking status: Former Smoker    Years: 3.00    Types: Pipe    Quit date: 12/27/2011  . Smokeless tobacco: Never Used  . Alcohol use No  . Drug  use: No  . Sexual activity: Not on file   Other Topics Concern  . Not on file   Social History Narrative  . No narrative on file    Family History: Family History  Problem Relation Age of Onset  . Heart disease Mother   . Heart disease Father   . Healthy Son   . Healthy Son     Review of Systems: Positive: N/A Negative:  A further 10 point review of systems was negative except what is listed in the HPI.                  Physical Exam: @VITALS2 @ General: No acute distress.  Awake. Head:  Normocephalic.  Atraumatic. ENT:  EOMI.  Mucous membranes moist Neck:  Supple.  No lymphadenopathy. CV:  S1 present. S2 present. Regular rate. Pulmonary: Equal effort bilaterally.  Clear to auscultation bilaterally. Abdomen: Soft.  Non tender to palpation. Skin:  Normal turgor.  No visible rash. Extremity: No gross deformity of bilateral upper extremities.  No gross deformity of                             lower extremities. Neurologic: Alert. Appropriate mood.    Studies:  No results for input(s): HGB, WBC, PLT in the last 72 hours.  No results for input(s): NA, K, CL, CO2, BUN, CREATININE, CALCIUM, GFRNONAA, GFRAA in the last 72 hours.  Invalid input(s): MAGNESIUM   No results for input(s): INR, APTT in the last 72 hours.  Invalid input(s): PT   Invalid input(s): ABG    Assessment:  Right renal caculi--residual after ESL on 10/30/2015.  Plan: Cystoscopy, right RGP, right ureteroscopy, HLL and extraction of right renal stones

## 2016-01-01 NOTE — Discharge Instructions (Signed)
Foley Catheter Care, Adult A Foley catheter is a soft, flexible tube. This tube is placed into your bladder to drain pee (urine). If you go home with this catheter in place, follow the instructions below. TAKING CARE OF THE CATHETER 1. Wash your hands with soap and water. 2. Put soap and water on a clean washcloth.  Clean the skin where the tube goes into your body.  Clean away from the tube site.  Never wipe toward the tube.  Clean the area using a circular motion.  Remove all the soap. Pat the area dry with a clean towel. For males, reposition the skin that covers the end of the penis (foreskin). 3. Attach the tube to your leg with tape or a leg strap. Do not stretch the tube tight. If you are using tape, remove any stickiness left behind by past tape you used. 4. Keep the drainage bag below your hips. Keep it off the floor. 5. Check your tube during the day. Make sure it is working and draining. Make sure the tube does not curl, twist, or bend. 6. Do not pull on the tube or try to take it out. TAKING CARE OF THE DRAINAGE BAGS You will have a large overnight drainage bag and a small leg bag. You may wear the overnight bag any time. Never wear the small bag at night. Follow the directions below. Emptying the Drainage Bag Empty your drainage bag when it is  - full or at least 2-3 times a day. 1. Wash your hands with soap and water. 2. Keep the drainage bag below your hips. 3. Hold the dirty bag over the toilet or clean container. 4. Open the pour spout at the bottom of the bag. Empty the pee into the toilet or container. Do not let the pour spout touch anything. 5. Clean the pour spout with a gauze pad or cotton ball that has rubbing alcohol on it. 6. Close the pour spout. 7. Attach the bag to your leg with tape or a leg strap. 8. Wash your hands well. Changing the Drainage Bag Change your bag once a month or sooner if it starts to smell or look dirty.  1. Wash your hands with soap  and water. 2. Pinch the rubber tube so that pee does not spill out. 3. Disconnect the catheter tube from the drainage tube at the connection valve. Do not let the tubes touch anything. 4. Clean the end of the catheter tube with an alcohol wipe. Clean the end of a the drainage tube with a different alcohol wipe. 5. Connect the catheter tube to the drainage tube of the clean drainage bag. 6. Attach the new bag to the leg with tape or a leg strap. Avoid attaching the new bag too tightly. 7. Wash your hands well. Cleaning the Drainage Bag 1. Wash your hands with soap and water. 2. Wash the bag in warm, soapy water. 3. Rinse the bag with warm water. 4. Fill the bag with a mixture of white vinegar and water (1 cup vinegar to 1 quart warm water [.2 liter vinegar to 1 liter warm water]). Close the bag and soak it for 30 minutes in the solution. 5. Rinse the bag with warm water. 6. Hang the bag to dry with the pour spout open and hanging downward. 7. Store the clean bag (once it is dry) in a clean plastic bag. 8. Wash your hands well. PREVENT INFECTION  Wash your hands before and after touching your tube.  Take showers every day. Wash the skin where the tube enters your body. Do not take baths. Replace wet leg straps with dry ones, if this applies.  Do not use powders, sprays, or lotions on the genital area. Only use creams, lotions, or ointments as told by your doctor.  For females, wipe from front to back after going to the bathroom.  Drink enough fluids to keep your pee clear or pale yellow unless you are told not to have too much fluid (fluid restriction).  Do not let the drainage bag or tubing touch or lie on the floor.  Wear cotton underwear to keep the area dry. GET HELP IF:  Your pee is cloudy or smells unusually bad.  Your tube becomes clogged.  You are not draining pee into the bag or your bladder feels full.  Your tube starts to leak. GET HELP RIGHT AWAY IF:  You have  pain, puffiness (swelling), redness, or yellowish-white fluid (pus) where the tube enters the body.  You have pain in the belly (abdomen), legs, lower back, or bladder.  You have a fever.  You see blood fill the tube, or your pee is pink or red.  You feel sick to your stomach (nauseous), throw up (vomit), or have chills.  Your tube gets pulled out. MAKE SURE YOU:   Understand these instructions.  Will watch your condition.  Will get help right away if you are not doing well or get worse.   This information is not intended to replace advice given to you by your health care provider. Make sure you discuss any questions you have with your health care provider.   Document Released: 06/29/2012 Document Revised: 03/25/2014 Document Reviewed: 06/29/2012 Elsevier Interactive Patient Education 2016 [place] AFTER URETEROSCOPY  Stent management  *Stents are often left in after ureteroscopy and stone treatment. If left in, they often cause urinary frequency, urgency, occasional blood in the urine, as well as flank discomfort with urination. These are all expected issues, and should resolve after the stent is removed. *Often times, a small thread is left on the end of the stent, and brought out through the urethra. If so, this is used to remove the stent, making it unnecessary to look in the bladder with a scope in the office to remove the stent. If a thread is left on, did not pull on it until instructed. *It is okay to pull string to remove stent on Thursday morning.  Diet  Once you have adequately recovered from anesthesia, you may gradually advance your diet, as tolerated, to your regular diet.  Activities  You may gradually increase your activities to your normal unrestricted level the day following your procedure.  Medications  You should resume all preoperative medications. If you are on aspirin-like compounds, you should not resume these until the  blood clears from your urine. If given an antibiotic by the surgeon, take these until they are completed. You may also be given, if you have a stent, medications to decrease the urinary frequency and urgency.  Pain  After ureteroscopy, there may be some pain on the side of the scope. Take your pain medicine for this. Usually, this pain resolves within a day or 2.  Fever  Please report any fever over 100 to the doctor.

## 2016-01-01 NOTE — Op Note (Signed)
Preoperative diagnosis: 13 millimeter right renal stone.  Postoperative diagnosis: Same.  Principal procedure: Cystoscopy, right retrograde ureteropyelogram, right ureteroscopy with holmium laser lithotripsy of a 13 millimeter right renal pelvic stone, placement of 6 French by 24 centimeter contour double-J stent with tether, fluoroscopic interpretation: Surgeon: Carl Bleecker  Anesthesia: Gen. with LMA  Complications: None.  Specimens: None.  Drains: 6 French by 24 centimeter contour double-J stent with tether  Indications: 75 year old male status post shockwave lithotripsy of a right renal pelvic stone recently.  The patient had an adequate fragmentation and passage of the stone.  Presents at this time for definitive lithotripsy using ureteroscopy.  The procedure as well as risks and complications have been discussed with the patient.  He understands these and desires to proceed.  Findings: Normal bladder.  He did have a large intravesical median lobe, which made it somewhat difficult to identify and catheterize the right ureteral orifice.  Retrograde ureteropyelogram performed with a 6 [place] open-ended catheter revealed a normal ureter.  There was mild right pyelocaliectasis and a filling defect at the medial renal pelvis consistent with the previously mentioned stone.  Description of procedure: The patient was properly identified and marked in the holding area.  He received preoperative IV antibiotics.  He was taken the operating room where general anesthetic was administered with the LMA.  He was placed in the dorsolithotomy position.  Genitalia were prepped and draped.  Timeout was performed.  A 23 French panendoscope was advanced under direct vision through his urethra.  There was mild narrowing of the meatus, but the scope was able to be passed without dilation.  There were no abnormalities of the urethra.  There was mild obstruction from hypertrophy with a median bar present.  The bladder  was entered and inspected circumferentially.  Mild trabeculations.  No urothelial abnormalities.  Once identified, the right ureteral orifice was cannulated with the open-ended catheter with the assistance of a sensor-tip guidewire.  Right retrograde ureteropyelogram was performed using Omnipaque with the above-mentioned findings.  Once the pyelogram was completed, the scope was removed as well as the open-ended catheter, with the sensor-tip guidewire in place with the tip in the upper pole calyceal system.  The ureter was sequentially dilated first with the inner core and then the entire 12/14 medium length ureteral access catheter.  Once in place, the inner core and guidewire    were removed.  The 6 French dual-lumen flexible digital ureteroscope was advanced through the open-ended catheter into the right pyelocalyceal system.  The partially fragmented stone was present right at the pelvis.  No other fragments were seen within the pyelo-calyceal system which was sequentially inspected with the scope.  The 200 micron fiber was then passed through the ureteroscope, and using laser energy set at eventually 50 hertz and 0.5 joules, the stone was fragmented into count was microscopic particles.  I did not feel that these needed to be extracted, as there were small enough to easily pass through the ureter, both with and without the stent in place.  Once the stone was fragmented into all the small particles, and no larger particles were seen, the scope was removed.  I then passed the sensor-tip guidewire through the access sheath, and the access sheath was removed.  The guidewire was backloaded through the cystoscope which was then placed within the bladder.  The 24 centimeter by 6 French contour double-J stent with string was placed, and deployed.  After adequate cystoscopic and fluoroscopic guidance.  The bladder was  drained.  The string was brought out through the urethral meatus and taped to the penis.  There was  a small amount of bleeding from the urethral meatus, most likely from instrumentation.  I wrapped this with gauze and placed a K[place] wrap which will later be removed prior to discharge.  The patient was then awakened and taken to the PACU in stable condition.  Tolerated procedure well.

## 2016-01-02 ENCOUNTER — Encounter (HOSPITAL_BASED_OUTPATIENT_CLINIC_OR_DEPARTMENT_OTHER): Payer: Self-pay | Admitting: Urology

## 2016-01-04 DIAGNOSIS — Z23 Encounter for immunization: Secondary | ICD-10-CM | POA: Diagnosis not present

## 2016-01-08 ENCOUNTER — Emergency Department (HOSPITAL_COMMUNITY): Payer: Medicare Other

## 2016-01-08 ENCOUNTER — Encounter (HOSPITAL_COMMUNITY): Payer: Self-pay | Admitting: Emergency Medicine

## 2016-01-08 ENCOUNTER — Inpatient Hospital Stay (HOSPITAL_COMMUNITY)
Admission: EM | Admit: 2016-01-08 | Discharge: 2016-01-10 | DRG: 391 | Disposition: A | Discharge status: Home / Self Care | Payer: Medicare Other | Attending: Family Medicine | Admitting: Family Medicine

## 2016-01-08 DIAGNOSIS — R7989 Other specified abnormal findings of blood chemistry: Secondary | ICD-10-CM | POA: Diagnosis present

## 2016-01-08 DIAGNOSIS — R1310 Dysphagia, unspecified: Secondary | ICD-10-CM | POA: Diagnosis present

## 2016-01-08 DIAGNOSIS — Z79899 Other long term (current) drug therapy: Secondary | ICD-10-CM

## 2016-01-08 DIAGNOSIS — Z6828 Body mass index (BMI) 28.0-28.9, adult: Secondary | ICD-10-CM

## 2016-01-08 DIAGNOSIS — Z955 Presence of coronary angioplasty implant and graft: Secondary | ICD-10-CM | POA: Diagnosis not present

## 2016-01-08 DIAGNOSIS — Z7982 Long term (current) use of aspirin: Secondary | ICD-10-CM

## 2016-01-08 DIAGNOSIS — I252 Old myocardial infarction: Secondary | ICD-10-CM

## 2016-01-08 DIAGNOSIS — I2510 Atherosclerotic heart disease of native coronary artery without angina pectoris: Secondary | ICD-10-CM | POA: Diagnosis not present

## 2016-01-08 DIAGNOSIS — N400 Enlarged prostate without lower urinary tract symptoms: Secondary | ICD-10-CM | POA: Diagnosis not present

## 2016-01-08 DIAGNOSIS — N189 Chronic kidney disease, unspecified: Secondary | ICD-10-CM

## 2016-01-08 DIAGNOSIS — Z794 Long term (current) use of insulin: Secondary | ICD-10-CM | POA: Diagnosis not present

## 2016-01-08 DIAGNOSIS — Z8249 Family history of ischemic heart disease and other diseases of the circulatory system: Secondary | ICD-10-CM

## 2016-01-08 DIAGNOSIS — R778 Other specified abnormalities of plasma proteins: Secondary | ICD-10-CM | POA: Diagnosis present

## 2016-01-08 DIAGNOSIS — R066 Hiccough: Secondary | ICD-10-CM | POA: Diagnosis not present

## 2016-01-08 DIAGNOSIS — R109 Unspecified abdominal pain: Secondary | ICD-10-CM

## 2016-01-08 DIAGNOSIS — I10 Essential (primary) hypertension: Secondary | ICD-10-CM | POA: Diagnosis present

## 2016-01-08 DIAGNOSIS — Z9841 Cataract extraction status, right eye: Secondary | ICD-10-CM

## 2016-01-08 DIAGNOSIS — E785 Hyperlipidemia, unspecified: Secondary | ICD-10-CM | POA: Diagnosis present

## 2016-01-08 DIAGNOSIS — E43 Unspecified severe protein-calorie malnutrition: Secondary | ICD-10-CM | POA: Insufficient documentation

## 2016-01-08 DIAGNOSIS — I129 Hypertensive chronic kidney disease with stage 1 through stage 4 chronic kidney disease, or unspecified chronic kidney disease: Secondary | ICD-10-CM | POA: Diagnosis not present

## 2016-01-08 DIAGNOSIS — E871 Hypo-osmolality and hyponatremia: Secondary | ICD-10-CM | POA: Diagnosis not present

## 2016-01-08 DIAGNOSIS — Z961 Presence of intraocular lens: Secondary | ICD-10-CM | POA: Diagnosis present

## 2016-01-08 DIAGNOSIS — Z85828 Personal history of other malignant neoplasm of skin: Secondary | ICD-10-CM

## 2016-01-08 DIAGNOSIS — Z87891 Personal history of nicotine dependence: Secondary | ICD-10-CM

## 2016-01-08 DIAGNOSIS — E1122 Type 2 diabetes mellitus with diabetic chronic kidney disease: Secondary | ICD-10-CM | POA: Diagnosis present

## 2016-01-08 DIAGNOSIS — Z87442 Personal history of urinary calculi: Secondary | ICD-10-CM | POA: Diagnosis not present

## 2016-01-08 DIAGNOSIS — N183 Chronic kidney disease, stage 3 (moderate): Secondary | ICD-10-CM | POA: Diagnosis present

## 2016-01-08 DIAGNOSIS — N179 Acute kidney failure, unspecified: Secondary | ICD-10-CM | POA: Diagnosis not present

## 2016-01-08 DIAGNOSIS — R1011 Right upper quadrant pain: Principal | ICD-10-CM | POA: Diagnosis present

## 2016-01-08 DIAGNOSIS — Z791 Long term (current) use of non-steroidal anti-inflammatories (NSAID): Secondary | ICD-10-CM

## 2016-01-08 DIAGNOSIS — Z9842 Cataract extraction status, left eye: Secondary | ICD-10-CM | POA: Diagnosis not present

## 2016-01-08 DIAGNOSIS — I4510 Unspecified right bundle-branch block: Secondary | ICD-10-CM | POA: Diagnosis present

## 2016-01-08 DIAGNOSIS — E119 Type 2 diabetes mellitus without complications: Secondary | ICD-10-CM

## 2016-01-08 DIAGNOSIS — R1314 Dysphagia, pharyngoesophageal phase: Secondary | ICD-10-CM

## 2016-01-08 DIAGNOSIS — K219 Gastro-esophageal reflux disease without esophagitis: Secondary | ICD-10-CM | POA: Diagnosis not present

## 2016-01-08 DIAGNOSIS — R1312 Dysphagia, oropharyngeal phase: Secondary | ICD-10-CM | POA: Diagnosis not present

## 2016-01-08 LAB — URINALYSIS, ROUTINE W REFLEX MICROSCOPIC
BILIRUBIN URINE: NEGATIVE
Glucose, UA: 500 mg/dL — AB
Ketones, ur: 15 mg/dL — AB
LEUKOCYTES UA: NEGATIVE
NITRITE: NEGATIVE
PH: 5.5 (ref 5.0–8.0)
Protein, ur: 30 mg/dL — AB
SPECIFIC GRAVITY, URINE: 1.025 (ref 1.005–1.030)

## 2016-01-08 LAB — CBC WITH DIFFERENTIAL/PLATELET
BASOS PCT: 0 %
Basophils Absolute: 0 10*3/uL (ref 0.0–0.1)
EOS ABS: 0.1 10*3/uL (ref 0.0–0.7)
EOS PCT: 1 %
HCT: 44.8 % (ref 39.0–52.0)
HEMOGLOBIN: 15.8 g/dL (ref 13.0–17.0)
Lymphocytes Relative: 13 %
Lymphs Abs: 1.3 10*3/uL (ref 0.7–4.0)
MCH: 29.7 pg (ref 26.0–34.0)
MCHC: 35.3 g/dL (ref 30.0–36.0)
MCV: 84.2 fL (ref 78.0–100.0)
MONOS PCT: 12 %
Monocytes Absolute: 1.3 10*3/uL — ABNORMAL HIGH (ref 0.1–1.0)
NEUTROS PCT: 74 %
Neutro Abs: 7.7 10*3/uL (ref 1.7–7.7)
PLATELETS: 173 10*3/uL (ref 150–400)
RBC: 5.32 MIL/uL (ref 4.22–5.81)
RDW: 13.7 % (ref 11.5–15.5)
WBC: 10.4 10*3/uL (ref 4.0–10.5)

## 2016-01-08 LAB — TROPONIN I
TROPONIN I: 0.06 ng/mL — AB (ref <0.03)
Troponin I: 0.06 ng/mL (ref <0.03)
Troponin I: 0.08 ng/mL (ref <0.03)

## 2016-01-08 LAB — COMPREHENSIVE METABOLIC PANEL
ALBUMIN: 2.8 g/dL — AB (ref 3.5–5.0)
ALK PHOS: 70 U/L (ref 38–126)
ALT: 105 U/L — ABNORMAL HIGH (ref 17–63)
ANION GAP: 8 (ref 5–15)
AST: 79 U/L — ABNORMAL HIGH (ref 15–41)
BUN: 44 mg/dL — ABNORMAL HIGH (ref 6–20)
CALCIUM: 8.8 mg/dL — AB (ref 8.9–10.3)
CHLORIDE: 101 mmol/L (ref 101–111)
CO2: 21 mmol/L — AB (ref 22–32)
Creatinine, Ser: 2.41 mg/dL — ABNORMAL HIGH (ref 0.61–1.24)
GFR calc Af Amer: 29 mL/min — ABNORMAL LOW (ref >60)
GFR calc non Af Amer: 25 mL/min — ABNORMAL LOW (ref >60)
GLUCOSE: 230 mg/dL — AB (ref 65–99)
Potassium: 4 mmol/L (ref 3.5–5.1)
SODIUM: 130 mmol/L — AB (ref 135–145)
Total Bilirubin: 1 mg/dL (ref 0.3–1.2)
Total Protein: 6.2 g/dL — ABNORMAL LOW (ref 6.5–8.1)

## 2016-01-08 LAB — GLUCOSE, CAPILLARY: Glucose-Capillary: 186 mg/dL — ABNORMAL HIGH (ref 65–99)

## 2016-01-08 LAB — URINE MICROSCOPIC-ADD ON: WBC UA: NONE SEEN WBC/hpf (ref 0–5)

## 2016-01-08 LAB — LIPASE, BLOOD: Lipase: 35 U/L (ref 11–51)

## 2016-01-08 MED ORDER — SULFAMETHOXAZOLE-TRIMETHOPRIM 800-160 MG PO TABS
1.0000 | ORAL_TABLET | Freq: Two times a day (BID) | ORAL | Status: DC
  Administered 2016-01-08 – 2016-01-10 (×4): 1 via ORAL
  Filled 2016-01-08 (×4): qty 1

## 2016-01-08 MED ORDER — SENNOSIDES-DOCUSATE SODIUM 8.6-50 MG PO TABS: 1.0000 | ORAL_TABLET | Freq: Every evening | ORAL | Status: DC | PRN

## 2016-01-08 MED ORDER — ONDANSETRON HCL 4 MG PO TABS: 4.0000 mg | ORAL_TABLET | Freq: Four times a day (QID) | ORAL | Status: DC | PRN

## 2016-01-08 MED ORDER — SUCRALFATE 1 GM/10ML PO SUSP
1.0000 g | Freq: Three times a day (TID) | ORAL | Status: DC
  Administered 2016-01-08 – 2016-01-10 (×8): 1 g via ORAL
  Filled 2016-01-08 (×8): qty 10

## 2016-01-08 MED ORDER — SODIUM CHLORIDE 0.9 % IV SOLN
80.0000 mg | Freq: Two times a day (BID) | INTRAVENOUS | Status: DC
  Administered 2016-01-08 – 2016-01-09 (×2): 80 mg via INTRAVENOUS
  Filled 2016-01-08 (×4): qty 80

## 2016-01-08 MED ORDER — IOPAMIDOL (ISOVUE-300) INJECTION 61%
INTRAVENOUS | Status: AC
  Filled 2016-01-08: qty 30

## 2016-01-08 MED ORDER — PANTOPRAZOLE SODIUM 40 MG IV SOLR
INTRAVENOUS | Status: AC
Start: 2016-01-08 — End: 2016-01-08
  Filled 2016-01-08: qty 80

## 2016-01-08 MED ORDER — FLEET ENEMA 7-19 GM/118ML RE ENEM: 1.0000 | ENEMA | Freq: Once | RECTAL | Status: DC | PRN

## 2016-01-08 MED ORDER — INSULIN ASPART PROT & ASPART (70-30 MIX) 100 UNIT/ML ~~LOC~~ SUSP
40.0000 [IU] | Freq: Two times a day (BID) | SUBCUTANEOUS | Status: DC
  Administered 2016-01-09 – 2016-01-10 (×3): 40 [IU] via SUBCUTANEOUS
  Filled 2016-01-08: qty 10

## 2016-01-08 MED ORDER — POTASSIUM CITRATE ER 10 MEQ (1080 MG) PO TBCR
10.0000 meq | EXTENDED_RELEASE_TABLET | Freq: Two times a day (BID) | ORAL | Status: DC
  Administered 2016-01-09 – 2016-01-10 (×3): 10 meq via ORAL
  Filled 2016-01-08 (×7): qty 1

## 2016-01-08 MED ORDER — BISACODYL 10 MG RE SUPP: 10.0000 mg | Freq: Every day | RECTAL | Status: DC | PRN

## 2016-01-08 MED ORDER — MORPHINE SULFATE (PF) 2 MG/ML IV SOLN: 2.0000 mg | INTRAVENOUS | Status: DC | PRN

## 2016-01-08 MED ORDER — SODIUM CHLORIDE 0.9 % IV SOLN
INTRAVENOUS | Status: DC
  Administered 2016-01-08 – 2016-01-10 (×5): via INTRAVENOUS

## 2016-01-08 MED ORDER — ACETAMINOPHEN 650 MG RE SUPP: 650.0000 mg | Freq: Four times a day (QID) | RECTAL | Status: DC | PRN

## 2016-01-08 MED ORDER — ACETAMINOPHEN 325 MG PO TABS: 650.0000 mg | ORAL_TABLET | Freq: Four times a day (QID) | ORAL | Status: DC | PRN

## 2016-01-08 MED ORDER — OXYCODONE-ACETAMINOPHEN 5-325 MG PO TABS: 1.0000 | ORAL_TABLET | Freq: Four times a day (QID) | ORAL | Status: DC | PRN

## 2016-01-08 MED ORDER — CARVEDILOL 3.125 MG PO TABS
3.1250 mg | ORAL_TABLET | Freq: Two times a day (BID) | ORAL | Status: DC
  Administered 2016-01-08 – 2016-01-10 (×5): 3.125 mg via ORAL
  Filled 2016-01-08 (×5): qty 1

## 2016-01-08 MED ORDER — ROSUVASTATIN CALCIUM 20 MG PO TABS
40.0000 mg | ORAL_TABLET | Freq: Every evening | ORAL | Status: DC
  Administered 2016-01-08 – 2016-01-10 (×3): 40 mg via ORAL
  Filled 2016-01-08 (×3): qty 2

## 2016-01-08 MED ORDER — METHOCARBAMOL 1000 MG/10ML IJ SOLN
500.0000 mg | Freq: Once | INTRAVENOUS | Status: AC
  Administered 2016-01-08: 500 mg via INTRAVENOUS
  Filled 2016-01-08: qty 5

## 2016-01-08 MED ORDER — ASPIRIN 81 MG PO CHEW
324.0000 mg | CHEWABLE_TABLET | Freq: Once | ORAL | Status: AC
  Administered 2016-01-08: 324 mg via ORAL
  Filled 2016-01-08: qty 4

## 2016-01-08 MED ORDER — ENOXAPARIN SODIUM 30 MG/0.3ML ~~LOC~~ SOLN
30.0000 mg | SUBCUTANEOUS | Status: DC
  Administered 2016-01-08 – 2016-01-09 (×2): 30 mg via SUBCUTANEOUS
  Filled 2016-01-08 (×2): qty 0.3

## 2016-01-08 MED ORDER — SODIUM CHLORIDE 0.9% FLUSH
3.0000 mL | Freq: Two times a day (BID) | INTRAVENOUS | Status: DC
  Administered 2016-01-08 – 2016-01-10 (×3): 3 mL via INTRAVENOUS

## 2016-01-08 MED ORDER — INSULIN ASPART 100 UNIT/ML ~~LOC~~ SOLN
0.0000 [IU] | Freq: Three times a day (TID) | SUBCUTANEOUS | Status: DC
  Administered 2016-01-09: 3 [IU] via SUBCUTANEOUS
  Administered 2016-01-09: 2 [IU] via SUBCUTANEOUS
  Administered 2016-01-10: 3 [IU] via SUBCUTANEOUS

## 2016-01-08 MED ORDER — CHLORPROMAZINE HCL 50 MG/2ML IJ SOLN
INTRAMUSCULAR | Status: AC
  Filled 2016-01-08: qty 2

## 2016-01-08 MED ORDER — ASPIRIN 81 MG PO CHEW
81.0000 mg | CHEWABLE_TABLET | Freq: Every day | ORAL | Status: DC
  Administered 2016-01-08 – 2016-01-10 (×3): 81 mg via ORAL
  Filled 2016-01-08 (×3): qty 1

## 2016-01-08 MED ORDER — SODIUM CHLORIDE 0.9 % IV SOLN
12.5000 mg | Freq: Four times a day (QID) | INTRAVENOUS | Status: DC | PRN
  Filled 2016-01-08: qty 0.5

## 2016-01-08 MED ORDER — OXYBUTYNIN CHLORIDE 5 MG PO TABS
5.0000 mg | ORAL_TABLET | Freq: Three times a day (TID) | ORAL | Status: DC
  Administered 2016-01-08 – 2016-01-10 (×5): 5 mg via ORAL
  Filled 2016-01-08 (×6): qty 1

## 2016-01-08 MED ORDER — PROMETHAZINE HCL 25 MG/ML IJ SOLN
12.5000 mg | Freq: Once | INTRAMUSCULAR | Status: AC
  Administered 2016-01-08: 12.5 mg via INTRAVENOUS
  Filled 2016-01-08: qty 1

## 2016-01-08 MED ORDER — POTASSIUM CITRATE ER 10 MEQ (1080 MG) PO TBCR: 10.0000 meq | EXTENDED_RELEASE_TABLET | Freq: Two times a day (BID) | ORAL | Status: DC

## 2016-01-08 MED ORDER — ONDANSETRON HCL 4 MG/2ML IJ SOLN: 4.0000 mg | Freq: Four times a day (QID) | INTRAMUSCULAR | Status: DC | PRN

## 2016-01-08 NOTE — ED Notes (Signed)
CRITICAL VALUE ALERT  Critical value received:  Troponin 0.06  Date of notification:  01/08/16  Time of notification:  0915  Critical value read back:Yes.    Nurse who received alert:  Charmayne Sheer, RN  MD notified (1st page):  Audie Pinto

## 2016-01-08 NOTE — ED Triage Notes (Signed)
Pt has pain in center of abdomen and also in center of chest since Monday when pt had cystoscopy and lithotripsy.  Pt also has no appetite and hiccups.  Pt alert and oriented at this time. Pt has nausea, one episode of emesis Monday and Tuesday.  Denies diarrhea.

## 2016-01-08 NOTE — ED Notes (Signed)
Pt taken to ct at this time.

## 2016-01-08 NOTE — H&P (Signed)
History and Physical    Maurice Ross [id number] DOB: 03/06/1941 DOA: 01/08/2016  PCP: Rory Percy, MD Consultants:  Claiborne Billings - cardiology; Chalmers Cater - endocrinology; Corbin Ade - GI; Blanchard - urology Patient coming from: home - lives with wife  Chief Complaint: hiccups  HPI: Maurice Ross is a 75 y.o. male with medical history significant of ASCVD, DM, HTN, HLD, GERD, stage III CKD, and recent lithotripsy and cystoscopy presenting with recurrent hiccups and anorexia.  Following urologic procedure last Monday (10/16), he developed n/v.  Started hiccuping.  Midepigastric pain with trying to take bite of anything.  He had been discharged Monday with a foley due to urinary retention, came back Tuesday for recheck and was able to have foley removed.  Urinary stent removed Thursday by wife - has not required much pain medication.  Unable to eat since procedure, plus hiccups lasting 60-90 minutes and recurrent.  With continuous sipping water, it does control the hiccups.  Every time he returned to bed, the hiccups would recur and wake him from sleep.  +constipation.  Used suppository Wednesday and Thursday without meaningful effect.   ED Course:  Per PA-C Marshell Levan: 8:35 AM Discussed next steps with pt including blood work, evaluate for pancreatitis, check CT, evaluate for gastric irritation or distension, and evaluate for subphrenic mass. Pt verbalized understanding and is agreeable with the plan.    9:42 AM Spoke with pt who states his hiccups alleviated about five minutes ago. Discussed troponin results with pt and informed him that we will move forward with an EKG and repeat troponin to r/o cardiac etiology. Pt verbalized understanding and is agreeable with the plan.   Review of Systems: As per HPI; otherwise 10 point review of systems reviewed and negative.   Ambulatory Status:  Ambulates independently  Past Medical History:  Diagnosis Date  . CKD (chronic kidney disease), stage III   . Coronary  artery disease CARDIOLOGIST-  DR Shelva Majestic   Coronary stent 1997, Last cath 1998, Last Nuc 10/24/2011-no change from previous, Last Echo 2011 EF 45%  . Diabetes mellitus type 2, uncontrolled ([place])    per pt last A1c 8.8 in Aug 2017  . GERD (gastroesophageal reflux disease)   . History of acute anterior wall MI    1997  . History of adenomatous polyp of colon    adenomatous 2004;   tubular adenoma 2013  . History of basal cell carcinoma excision    05-24-2015  left cheek  . History of esophageal dilatation   . Hyperlipidemia    lipomet analysis LDL particle # at 786  . 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 Renal calculus, right   . Renal cyst   . S/P coronary artery stent placement    1997  --  PCI  and stenting to LAD  . Wears glasses   . Wears hearing aid    bilateral    Past Surgical History:  Procedure Laterality Date  . BALLOON DILATION  01/24/2012   Procedure: BALLOON DILATION;  Surgeon: Rogene Houston, MD;  [facility];  Service: Endoscopy;  Laterality: N/A;  . CARDIOVASCULAR STRESS TEST  10-24-2011   dr Claiborne Billings   Low risk nuclear study w/ mild to moderate perfustion defect due to infarct/scar w/  mild perinfarct ischemia in the apical septal and apical regions/  normal LV function and mild hypokinesis in the apical and apical septal (no sign. change compared to previous study()  .  CATARACT EXTRACTION W/ INTRAOCULAR LENS  IMPLANT, BILATERAL  2015  . COLONOSCOPY WITH ESOPHAGOGASTRODUODENOSCOPY (EGD)  01/24/2012   Procedure: COLONOSCOPY WITH ESOPHAGOGASTRODUODENOSCOPY (EGD);  Surgeon: Rogene Houston, MD;  [facility];  Service: Endoscopy;  Laterality: N/A;  955  . CORONARY ANGIOPLASTY  06/08/1996   dr Shelva Majestic   Balloon angioplasty to  pLAD for in-stent restenosis, no change mLAD 30-50%/  narrowing  midRCA 40-50%/  mild ectasia of LCX w/ 30-50%  proximal and marginal narrowing/  mild LV dysfunction w/ anterolateral  hypocontractility  . CORONARY ANGIOPLASTY WITH STENT PLACEMENT  09-15-1995    dr Claiborne Billings   PTCA/ Stenting (palmaz-schatz)  to proximal LAD  . CYSTOSCOPY WITH STENT PLACEMENT Right 01/01/2016   Procedure: CYSTOSCOPY WITH STENT PLACEMENT;  Surgeon: Franchot Gallo, MD;  Location[facility];  Service: Urology;  Laterality: Right;  . CYSTOSCOPY/RETROGRADE/URETEROSCOPY/STONE EXTRACTION WITH BASKET Right 01/01/2016   Procedure: CYSTOSCOPY/RETROGRADE/URETEROSCOPY;  Surgeon: Franchot Gallo, MD;  Location[facility];  Service: Urology;  Laterality: Right;  . EXTRACORPOREAL SHOCK WAVE LITHOTRIPSY  10-30-2015;   11-25-2011  . HOLMIUM LASER APPLICATION Right 0000000   Procedure: HOLMIUM LASER APPLICATION;  Surgeon: Franchot Gallo, MD;  Location[facility];  Service: Urology;  Laterality: Right;  . LUMBAR MICRODISCECTOMY  1998   L5 -- S1  . MALONEY DILATION  01/24/2012   Procedure: MALONEY DILATION;  Surgeon: Rogene Houston, MD;  [facility];  Service: Endoscopy;  Laterality: N/A;  . SAVORY DILATION  01/24/2012   Procedure: SAVORY DILATION;  Surgeon: Rogene Houston, MD;  [facility];  Service: Endoscopy;  Laterality: N/A;  . TRANSTHORACIC ECHOCARDIOGRAM  01/25/2014   mild concentric LVH, ef [id number], grade 1 diastolic dysfunction/  mild PR    Social History   Social History  . Marital status: Married    Spouse name: Blanch Media  . Number of children: 2  . Years of education: N/A   Occupational History  . retired Engineer, manufacturing systems    Social History Main Topics  . Smoking status: Former Smoker    Years: 3.00    Types: Pipe, Cigars    Quit date: 12/27/2011  . Smokeless tobacco: Never Used  . Alcohol use No  . Drug use: No  . Sexual activity: Not on file   Other Topics Concern  . Not on file   Social History Narrative  . No narrative on file    No Known Allergies  Family History  Problem Relation Age of Onset    . Heart disease Mother 57  . Heart disease Father 95  . Healthy Son   . Healthy Son     Prior to Admission medications   Medication Sig Start Date End Date Taking? Authorizing Provider  aspirin 81 MG tablet Take 81 mg by mouth daily.   Yes Historical Provider, MD  carvedilol (COREG) 3.125 MG tablet Take 1 tablet by mouth two  times daily 01/04/15  Yes Troy Sine, MD  Insulin Lispro Prot & Lispro (HUMALOG MIX 75/25 KWIKPEN ) Inject 40 Units into the skin 2 (two) times daily before a meal.    Yes Historical Provider, MD  lisinopril (PRINIVIL,ZESTRIL) 10 MG tablet Take 0.5 tablets by mouth every morning.  01/18/15  Yes Historical Provider, MD  oxybutynin (DITROPAN) 5 MG tablet Take 1 tablet (5 mg total) by mouth 3 (three) times daily. 01/01/16  Yes Franchot Gallo, MD  oxyCODONE-acetaminophen (PERCOCET/ROXICET) 5-325 MG tablet Take 1  tablet by mouth every 6 (six) hours as needed for severe pain. 09/16/15  Yes Ezequiel Essex, MD  potassium citrate (UROCIT-K) 10 MEQ (1080 MG) SR tablet Take 10 mEq by mouth 2 (two) times daily.   Yes Historical Provider, MD  promethazine (PHENERGAN) 25 MG suppository Place 25 mg rectally every 6 (six) hours as needed for nausea or vomiting.   Yes Historical Provider, MD  rosuvastatin (CRESTOR) 40 MG tablet Take 40 mg by mouth every evening.    Yes Historical Provider, MD  sulfamethoxazole-trimethoprim (BACTRIM DS,SEPTRA DS) 800-160 MG tablet Take 1 tablet by mouth 2 (two) times daily. 01/01/16  Yes Franchot Gallo, MD    Physical Exam: Vitals:   01/08/16 1630 01/08/16 1700 01/08/16 1809 01/08/16 2124  BP: 133/75 135/80 140/72 135/72  Pulse: 92  (!) 101 86  Resp: 17 22 20 20   Temp:   100.2 F (37.9 C) 100.2 F (37.9 C)  TempSrc:   Oral Oral  SpO2: 97%  99% 98%  Weight:   85.3 kg (188 lb)   Height:   5\' 8"  (1.727 m)      General:  Appears calm and comfortable and is NAD Eyes:  PERRL, EOMI, normal lids, iris ENT:  grossly normal hearing, lips &  tongue, mmm Neck:  no LAD, masses or thyromegaly Cardiovascular:  RRR, no m/r/g. No LE edema.  Respiratory:  CTA bilaterally, no w/r/r. Normal respiratory effort. Abdomen:  soft, mild-moderate TTP in epigastric region and RUQ, nd, NABS Skin:  no rash or induration seen on limited exam Musculoskeletal:  grossly normal tone BUE/BLE, good ROM, no bony abnormality Psychiatric:  grossly normal mood and affect, speech fluent and appropriate, AOx3 Neurologic:  CN 2-12 grossly intact, moves all extremities in coordinated fashion, sensation intact  Labs on Admission: I have personally reviewed following labs and imaging studies  CBC:  Recent Labs Lab 01/08/16 0838  WBC 10.4  NEUTROABS 7.7  HGB 15.8  HCT 44.8  MCV 84.2  PLT [id number]   Basic Metabolic Panel:  Recent Labs Lab 01/08/16 0838  NA 130*  K 4.0  CL 101  CO2 21*  GLUCOSE 230*  BUN 44*  CREATININE 2.41*  CALCIUM 8.8*   GFR: Estimated Creatinine Clearance: 28.6 mL/min (by C-G formula based on SCr of 2.41 mg/dL (H)). Liver Function Tests:  Recent Labs Lab 01/08/16 0838  AST 79*  ALT 105*  ALKPHOS 70  BILITOT 1.0  PROT 6.2*  ALBUMIN 2.8*    Recent Labs Lab 01/08/16 0838  LIPASE 35   No results for input(s): AMMONIA in the last 168 hours. Coagulation Profile: No results for input(s): INR, PROTIME in the last 168 hours. Cardiac Enzymes:  Recent Labs Lab 01/08/16 0838 01/08/16 1331 01/08/16 2057  TROPONINI 0.06* 0.06* 0.08*   BNP (last 3 results) No results for input(s): PROBNP in the last 8760 hours. HbA1C: No results for input(s): HGBA1C in the last 72 hours. CBG:  Recent Labs Lab 01/08/16 2043  GLUCAP 186*   Lipid Profile: No results for input(s): CHOL, HDL, LDLCALC, TRIG, CHOLHDL, LDLDIRECT in the last 72 hours. Thyroid Function Tests: No results for input(s): TSH, T4TOTAL, FREET4, T3FREE, THYROIDAB in the last 72 hours. Anemia Panel: No results for input(s): VITAMINB12, FOLATE, FERRITIN,  TIBC, IRON, RETICCTPCT in the last 72 hours. Urine analysis:    Component Value Date/Time   COLORURINE YELLOW 01/08/2016 [place] 01/08/2016 0951   LABSPEC 1.025 01/08/2016 0951   PHURINE 5.5 01/08/2016 [id number]  GLUCOSEU 500 (A) 01/08/2016 0951   HGBUR LARGE (A) 01/08/2016 0951   BILIRUBINUR NEGATIVE 01/08/2016 0951   KETONESUR 15 (A) 01/08/2016 0951   PROTEINUR 30 (A) 01/08/2016 0951   NITRITE NEGATIVE 01/08/2016 0951   LEUKOCYTESUR NEGATIVE 01/08/2016 0951    Creatinine Clearance: Estimated Creatinine Clearance: 28.6 mL/min (by C-G formula based on SCr of 2.41 mg/dL (H)).  Sepsis Labs: @LABRCNTIP (procalcitonin:4,lacticidven:4) )No results found for this or any previous visit (from the past 240 hour(s)).   Radiological Exams on Admission: Ct Abdomen Pelvis Wo Contrast  Result Date: 01/08/2016 CLINICAL DATA:  Worsening abdominal pain, nausea, and hiccups for 1 week. Recent cystoscopy and lithotripsy for right renal pelvic stone. EXAM: CT ABDOMEN AND PELVIS WITHOUT CONTRAST TECHNIQUE: Multidetector CT imaging of the abdomen and pelvis was performed following the standard protocol without IV contrast. COMPARISON:  09/16/2015 FINDINGS: Lower chest: No acute findings. Stable cardiomegaly and coronary artery calcification. Hepatobiliary: No mass visualized on this unenhanced exam. Gallbladder is unremarkable. Pancreas: No mass or inflammatory process visualized on this unenhanced exam. Spleen:  Within normal limits in size. Adrenals/Urinary tract: Previously seen calculus in the right renal pelvis is no longer visualized. Mild right hydronephrosis and ureterectasis seen with 2 tiny 2-3 mm stone fragments in the mid right ureter at the level of the iliac vessels on image 54/4, and at least 2 stone fragments in the distal right ureter near the ureterovesical junction, largest measuring 5 mm. New right perinephric and retroperitoneal soft tissue stranding is seen. A small low  attenuation subcapsular fluid collection is seen along the lateral aspect of the right kidney measuring 1.6 x 4.9 cm which is new since previous study, and most likely secondary to recent lithotripsy. Tiny bilateral fluid attenuation renal cysts again noted. No evidence of left ureteral calculi or left-sided hydronephrosis. No bladder calculi identified. Stomach/Bowel: No evidence of obstruction, inflammatory process, or abnormal fluid collections. Scattered colonic diverticulosis, without evidence diverticulitis. Vascular/Lymphatic: No pathologically enlarged lymph nodes identified. No evidence of abdominal aortic aneurysm. Aortic atherosclerosis. Reproductive:  Stable moderately enlarged prostate. Other:  None. Musculoskeletal:  No suspicious bone lesions identified. IMPRESSION: New right perinephric and retroperitoneal soft tissue stranding with increased mild right hydroureteronephrosis due to several stone fragments in the mid and distal right ureter. Largest stone fragment measures 5 mm near the ureterovesical junction. Small low-attenuation right renal subcapsular fluid collection measuring 1.6 x 4.9 cm, most likely secondary to recent lithotripsy. Stable moderately enlarged prostate gland. Electronically Signed   By: Earle Gell M.D.   On: 01/08/2016 12:07   US Abdomen Limited  Result Date: 01/08/2016 CLINICAL DATA:  Right upper quadrant pain with nausea for 1 week. Hiccups. Elevated liver function tests. EXAM: US ABDOMEN LIMITED - RIGHT UPPER QUADRANT COMPARISON:  CT abdomen and pelvis 01/08/2016 FINDINGS: Gallbladder: No gallstones or wall thickening visualized. No sonographic Murphy sign noted by sonographer. Common bile duct: Diameter: 3 mm Liver: No focal lesion identified. Within normal limits in parenchymal echogenicity. IMPRESSION: Unremarkable right upper quadrant ultrasound. Electronically Signed   By: Logan Bores M.D.   On: 01/08/2016 16:16    EKG: Independently reviewed.  NSR with rate  89; RBBB  Assessment/Plan Principal Problem:   Intractable hiccups Active Problems:   GERD (gastroesophageal reflux disease)   DM (diabetes mellitus) ([place])   History of kidney stones   RBBB   Hyperlipidemia LDL goal <70   Acute kidney injury superimposed on chronic kidney disease (HCC)   Hyponatremia   Elevated troponin   Essential  hypertension   Hiccups/Epigastric pain/RUQ pain/GERD -Temporally, it is most logical that patient had an untoward event resulting from his procedure since his symptoms started immediately following the procedure -With his ALT:AST ratio as it is, gallbladder disease was also on the differential - but RUQ [place] was negative -Most likely etiology appears to be PUD/GERD, which is likely contributing to hiccups -Will treat hiccups with thorazine, as this treatment has been proven to be effective -Will also treat patient with IV Protonix BID and with Carafate pre-meal -Will start patient on clear liquids and attempt to advance diet -If these efforts are unsuccessful, he will likely need GI consultation for possible EGD tomorrow -Pancreatitis is also possible but lipase is normal and so will not pursue further  AKI on CKD -Patient with baseline stage 3 CKD -Creatinine was 1.80 on 10/16, currently 2.41 -Suspect volume deficiency/prerenal azotemia -Will rehydrate at 150 cc/hr overnight and follow -Suspect that hyponatremia is a result of mild water intoxication based on patient report that continuously sipping water was the only thing that kept his hiccups at [place]; would anticipate this to correct with rehydration -Hold Lisinopril for now  H/o kidney stones -Recent urologic procedure -Patient with residual stones -It is possible that his RUQ pain is actually flank pain associated with nephrolithiasis -Will hydrate and treat with pain medication and follow -Continue Bactrim, Urocit, Ditropan  RBBB with mildly elevated troponin -Uncertain baseline -Creatinine  is elevated and so troponin leak may be related to renal dysfunction -May also be related to demand ischemia in the setting of other medical issues -Patient denies chest pain -EKG is not acute - but more difficult to interpret with RBBB -Will continue to trend troponin until it peaks and is downtrending -No additional intervention for now -By Dr. Evette Georges note in 2/16, he was planning to have a myocardial perfusion study repeated in 2/17, and it does not appear that this happened; it may be reasonable to schedule as an inpatient or outpatient based on the current situation -Continue ASA 81 mg daily  HTN -Continue Coreg -Hold Lisinopril due to AKI  HLD -Continue Crestor 40 mg   DM -Continue 75/25 - although monitor for hypoglycemia since the patient has had minimal PO intake  -Cover with SSI   DVT prophylaxis: Lovenox Code Status:  Full - confirmed with patient/family Family Communication: Wife present throughout encounter  Disposition Plan:  Home once clinically improved Consults called: None   Admission status: Admit - It is my clinical opinion that admission to [place] is reasonable and necessary because this patient will require at least 2 midnights in the hospital to treat this condition based on the medical complexity of the problems presented.  Given the aforementioned information, the predictability of an adverse outcome is felt to be significant.    Karmen Bongo MD [facility]  If 7PM-7AM, please contact night-coverage [email or web address] Password [email or web address]  01/08/2016, 10:30 PM

## 2016-01-08 NOTE — ED Notes (Signed)
3rd floor charge nurse called to assess bed status. Charge RN reported nursing staff unable to take any new admissions at this time and will assign bed ready when they are available to accept. ED Charge RN notified.

## 2016-01-08 NOTE — ED Provider Notes (Signed)
[facility] DEPT Provider Note   CSN: [id number] Arrival date & time: 01/08/16  8786  By signing my name below, I, Rayna Sexton, attest that this documentation has been prepared under the direction and in the presence of Lily Kocher, PA-C. Electronically Signed: Rayna Sexton, ED Scribe. 01/08/16. 9:10 AM.  History   Chief Complaint Chief Complaint  Patient presents with  . Abdominal Pain    HPI HPI Comments: Maurice Ross is a 75 y.o. male who presents to the Emergency Department complaining of abdominal pain onset 01/01/2016. Pt states he had a cystoscopy and lithotripsy performed one week ago at [facility] and since has experienced his abdominal pain with associated hiccups and nausea. His hiccups typically last 30-40 minutes and sometimes improve with water but in last three days he has been unable to eat due to hiccups and because he also experienced burning sensation when swallowing. No operations or procedures involving esophageus, chest or abdomen. He had an endoscopy performed five years ago which was normal. His last BM was 01/04/2016 with the assistance of a suppository. Pt has tried multiple OTC medications and home remedies w/o significant relief. He is not a smoker and does not consume ETOH. No other associated symptoms at this time.   The history is provided by the patient. No language interpreter was used.  Abdominal Pain   This is a new problem. The current episode started more than 2 days ago. The problem occurs constantly. The problem has not changed since onset.Associated with: previous procedure. The pain is located in the RUQ. Associated symptoms include nausea and arthralgias. Pertinent negatives include vomiting.    Past Medical History:  Diagnosis Date  . CKD (chronic kidney disease), stage III   . Coronary artery disease CARDIOLOGIST-  DR Shelva Majestic   Coronary stent 1997, Last cath 1998, Last Nuc 10/24/2011-no change from previous, Last Echo 2011  EF 45%  . Diabetes mellitus type 2, uncontrolled ([place])    per pt last A1c 8.8 in Aug 2017  . GERD (gastroesophageal reflux disease)   . History of acute anterior wall MI    1997  . History of adenomatous polyp of colon    adenomatous 2004;   tubular adenoma 2013  . History of basal cell carcinoma excision    05-24-2015  left cheek  . History of esophageal dilatation   . Hyperlipidemia    lipomet analysis LDL particle # at 786  . 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 Renal calculus, right   . Renal cyst   . S/P coronary artery stent placement    1997  --  PCI  and stenting to LAD  . Wears glasses   . Wears hearing aid    bilateral    Patient Active Problem List   Diagnosis Date Noted  . Kidney disease, chronic, stage III (moderate, EGFR 30-59 ml/min) 04/12/2015  . Hyperlipidemia LDL goal <70 01/20/2014  . Bradycardia 12/18/2012  . RBBB 12/18/2012  . CAD (coronary artery disease) 12/30/2011  . GERD (gastroesophageal reflux disease) 12/30/2011  . Dysphagia 12/30/2011  . Hyperlipemia 12/30/2011  . DM (diabetes mellitus) ([place]) 12/30/2011  . History of kidney stones 12/30/2011    Past Surgical History:  Procedure Laterality Date  . BALLOON DILATION  01/24/2012   Procedure: BALLOON DILATION;  Surgeon: Rogene Houston, MD;  Location: [facility];  Service: Endoscopy;  Laterality: N/A;  . CARDIOVASCULAR STRESS TEST  10-24-2011   dr Claiborne Billings  Low risk nuclear study w/ mild to moderate perfustion defect due to infarct/scar w/  mild perinfarct ischemia in the apical septal and apical regions/  normal LV function and mild hypokinesis in the apical and apical septal (no sign. change compared to previous study()  . CATARACT EXTRACTION W/ INTRAOCULAR LENS  IMPLANT, BILATERAL  2015  . COLONOSCOPY WITH ESOPHAGOGASTRODUODENOSCOPY (EGD)  01/24/2012   Procedure: COLONOSCOPY WITH ESOPHAGOGASTRODUODENOSCOPY (EGD);  Surgeon: Rogene Houston, MD;  Location: [facility];  Service: Endoscopy;  Laterality: N/A;  955  . CORONARY ANGIOPLASTY  06/08/1996   dr Shelva Majestic   Balloon angioplasty to  pLAD for in-stent restenosis, no change mLAD 30-50%/  narrowing  midRCA 40-50%/  mild ectasia of LCX w/ 30-50%  proximal and marginal narrowing/  mild LV dysfunction w/ anterolateral hypocontractility  . CORONARY ANGIOPLASTY WITH STENT PLACEMENT  09-15-1995    dr Claiborne Billings   PTCA/ Stenting (palmaz-schatz)  to proximal LAD  . CYSTOSCOPY WITH STENT PLACEMENT Right 01/01/2016   Procedure: CYSTOSCOPY WITH STENT PLACEMENT;  Surgeon: Franchot Gallo, MD;  Location: [facility];  Service: Urology;  Laterality: Right;  . CYSTOSCOPY/RETROGRADE/URETEROSCOPY/STONE EXTRACTION WITH BASKET Right 01/01/2016   Procedure: CYSTOSCOPY/RETROGRADE/URETEROSCOPY;  Surgeon: Franchot Gallo, MD;  Location: [facility];  Service: Urology;  Laterality: Right;  . EXTRACORPOREAL SHOCK WAVE LITHOTRIPSY  10-30-2015;   11-25-2011  . HOLMIUM LASER APPLICATION Right 19/50/9326   Procedure: HOLMIUM LASER APPLICATION;  Surgeon: Franchot Gallo, MD;  Location[facility];  Service: Urology;  Laterality: Right;  . LUMBAR MICRODISCECTOMY  1998   L5 -- S1  . MALONEY DILATION  01/24/2012   Procedure: MALONEY DILATION;  Surgeon: Rogene Houston, MD;  [facility];  Service: Endoscopy;  Laterality: N/A;  . SAVORY DILATION  01/24/2012   Procedure: SAVORY DILATION;  Surgeon: Rogene Houston, MD;  [facility];  Service: Endoscopy;  Laterality: N/A;  . TRANSTHORACIC ECHOCARDIOGRAM  01/25/2014   mild concentric LVH, ef 71-24%, grade 1 diastolic dysfunction/  mild PR       Home Medications    Prior to Admission medications   Medication Sig Start Date End Date Taking? Authorizing Provider  aspirin 81 MG tablet Take 81 mg by mouth daily.    Historical Provider, MD  carvedilol (COREG) 3.125 MG tablet Take 1 tablet by mouth two  times  daily 01/04/15   Troy Sine, MD  ibuprofen (ADVIL,MOTRIN) 400 MG tablet Take 1 tablet (400 mg total) by mouth 3 (three) times daily. 09/16/15   Ezequiel Essex, MD  Insulin Lispro Prot & Lispro (HUMALOG MIX 75/25 KWIKPEN ) Inject 40 Units into the skin 2 (two) times daily before a meal.     Historical Provider, MD  lisinopril (PRINIVIL,ZESTRIL) 10 MG tablet Take 0.5 tablets by mouth every morning.  01/18/15   Historical Provider, MD  ondansetron (ZOFRAN) 4 MG tablet Take 1 tablet (4 mg total) by mouth every 6 (six) hours. 09/16/15   Ezequiel Essex, MD  oxybutynin (DITROPAN) 5 MG tablet Take 1 tablet (5 mg total) by mouth 3 (three) times daily. 01/01/16   Franchot Gallo, MD  oxyCODONE-acetaminophen (PERCOCET/ROXICET) 5-325 MG tablet Take 1 tablet by mouth every 6 (six) hours as needed for severe pain. 09/16/15   Ezequiel Essex, MD  potassium citrate (UROCIT-K) 10 MEQ (1080 MG) SR tablet Take 10 mEq by mouth 2 (two) times daily.    Historical Provider, MD  rosuvastatin (CRESTOR) 40 MG tablet Take  40 mg by mouth every evening.     Historical Provider, MD  sulfamethoxazole-trimethoprim (BACTRIM DS,SEPTRA DS) 800-160 MG tablet Take 1 tablet by mouth 2 (two) times daily. 01/01/16   Franchot Gallo, MD    Family History Family History  Problem Relation Age of Onset  . Heart disease Mother   . Heart disease Father   . Healthy Son   . Healthy Son     Social History Social History  Substance Use Topics  . Smoking status: Former Smoker    Years: 3.00    Types: Pipe    Quit date: 12/27/2011  . Smokeless tobacco: Never Used  . Alcohol use No     Allergies   Review of patient's allergies indicates no known allergies.   Review of Systems Review of Systems  HENT: Positive for sore throat.   Gastrointestinal: Positive for abdominal pain and nausea. Negative for vomiting.       Hiccups   Genitourinary: Positive for flank pain.  Musculoskeletal: Positive for arthralgias.  All other  systems reviewed and are negative.  Physical Exam Updated Vital Signs BP 148/84 (BP Location: Right Arm)   Pulse 98   Temp 98.2 F (36.8 C) (Oral)   Ht _0  (1.727 m)   Wt 188 lb (85.3 kg)   SpO2 97%   BMI 28.59 kg/m   Physical Exam  Constitutional: He is oriented to person, place, and time. He appears well-developed and well-nourished.  HENT:  Head: Normocephalic and atraumatic.  Eyes: Conjunctivae and EOM are normal. Pupils are equal, round, and reactive to light. No scleral icterus.  No scleral icterus.   Neck: Normal range of motion.  Cardiovascular: Normal rate, regular rhythm, normal heart sounds and intact distal pulses.   No murmur heard. Pulmonary/Chest: Effort normal and breath sounds normal. No respiratory distress. He has no wheezes. He has no rales.  LCTA  Abdominal: Soft. He exhibits no distension and no mass. There is tenderness in the right upper quadrant. There is CVA tenderness (right (recent kidney stone)). There is no guarding.  Mild to moderate pain with palpation of the RUQ. No abdominal distension. Bowel sounds active. No mass palpated. No pulsating mass palpated.   Musculoskeletal: Normal range of motion.  Neurological: He is alert and oriented to person, place, and time.  Skin: Skin is warm and dry.  Psychiatric: He has a normal mood and affect.  Nursing note and vitals reviewed.  ED Treatments / Results  Labs (all labs ordered are listed, but only abnormal results are displayed) Labs Reviewed  CBC WITH DIFFERENTIAL/PLATELET - Abnormal; Notable for the following:       Result Value   Monocytes Absolute 1.3 (*)    All other components within normal limits  COMPREHENSIVE METABOLIC PANEL - Abnormal; Notable for the following:    Sodium 130 (*)    CO2 21 (*)    Glucose, Bld 230 (*)    BUN 44 (*)    Creatinine, Ser 2.41 (*)    Calcium 8.8 (*)    Total Protein 6.2 (*)    Albumin 2.8 (*)    AST 79 (*)    ALT 105 (*)    GFR calc non Af Amer 25 (*)     GFR calc Af Amer 29 (*)    All other components within normal limits  URINALYSIS, ROUTINE W REFLEX MICROSCOPIC (NOT AT [facility]) - Abnormal; Notable for the following:    Glucose, UA 500 (*)    Hgb urine  dipstick LARGE (*)    Ketones, ur 15 (*)    Protein, ur 30 (*)    All other components within normal limits  TROPONIN I - Abnormal; Notable for the following:    Troponin I 0.06 (*)    All other components within normal limits  URINE MICROSCOPIC-ADD ON - Abnormal; Notable for the following:    Squamous Epithelial / LPF 0-5 (*)    Bacteria, UA MANY (*)    All other components within normal limits  LIPASE, BLOOD    EKG  EKG Interpretation  Date/Time:  Monday January 08 2016 09:52:57 EDT Ventricular Rate:  89 PR Interval:    QRS Duration: 136 QT Interval:  402 QTC Calculation: 490 R Axis:   -87 Text Interpretation:  Sinus rhythm Right bundle branch block Anteroseptal infarct, age indeterminate Abnormal ekg Confirmed by BEATON  MD, ROBERT ([id number]) on 01/08/2016 10:04:29 AM       Radiology No results found.  Procedures Procedures  DIAGNOSTIC STUDIES: Oxygen Saturation is 97% on RA, normal by my interpretation.    COORDINATION OF CARE: 8:35 AM Discussed next steps with pt including blood work, evaluate for pancreatitis, check CT, evaluate for gastric irritation or distension, and evaluate for subphrenic mass. Pt verbalized understanding and is agreeable with the plan.    9:42 AM Spoke with pt who states his hiccups alleviated about five minutes ago. Discussed troponin results with pt and informed him that we will move forward with an EKG and repeat troponin to r/o cardiac etiology. Pt verbalized understanding and is agreeable with the plan.   Medications Ordered in ED Medications  methocarbamol (ROBAXIN) 500 mg in dextrose 5 % 50 mL IVPB (not administered)  promethazine (PHENERGAN) injection 12.5 mg (12.5 mg Intravenous Given 01/08/16 0855)     Initial Impression /  Assessment and Plan / ED Course  I have reviewed the triage vital signs and the nursing notes.  Pertinent labs & imaging results that were available during my care of the patient were reviewed by me and considered in my medical decision making (see chart for details).  Clinical Course    *I have reviewed nursing notes, vital signs, and all appropriate lab and imaging results for this patient.*  *I personally performed the services described in this documentation, which was scribed in my presence. The recorded information has been reviewed and is accurate.* Final Clinical Impressions(s) / ED Diagnoses  Vital signs stable.  9:28AM Troponin 0.06. 366m ASA given.  Cbc wnl. CO2 low at 21, glucose 230. BUN and CReat elevated (not new). AST and ALT elevated. Lipase wnl. Hiccups and abdominal pain resolved after IV Robaxin and promethazine.  CT abdomen and pelvis is negative for any mass or distention involving the abdomen. There is no abnormality noted of the biliary tree. The lower portion of the chest shows no acute abnormality. There remain stone fragments from the recent urology procedure with some hydronephrosis still present.  Troponin #2 is pending.  Patient had a recurrence of hiccups. IV promethazine given.  Troponin#2 remains elevated at 0.06.  Latest lab findings discussed with patient and his wife. Case discussed with the hospitalist. Patient will be admitted. Right upper quadrant ultrasound has been ordered.    Final diagnoses:  None    New Prescriptions New Prescriptions   No medications on file     HLily Kocher PA-C 01/08/16 1[place] MD 01/13/16 [phone]

## 2016-01-09 DIAGNOSIS — I252 Old myocardial infarction: Secondary | ICD-10-CM | POA: Diagnosis not present

## 2016-01-09 DIAGNOSIS — Z955 Presence of coronary angioplasty implant and graft: Secondary | ICD-10-CM | POA: Diagnosis not present

## 2016-01-09 DIAGNOSIS — E785 Hyperlipidemia, unspecified: Secondary | ICD-10-CM | POA: Diagnosis not present

## 2016-01-09 DIAGNOSIS — N179 Acute kidney failure, unspecified: Secondary | ICD-10-CM | POA: Diagnosis not present

## 2016-01-09 DIAGNOSIS — Z794 Long term (current) use of insulin: Secondary | ICD-10-CM | POA: Diagnosis not present

## 2016-01-09 DIAGNOSIS — Z7982 Long term (current) use of aspirin: Secondary | ICD-10-CM | POA: Diagnosis not present

## 2016-01-09 DIAGNOSIS — Z9842 Cataract extraction status, left eye: Secondary | ICD-10-CM | POA: Diagnosis not present

## 2016-01-09 DIAGNOSIS — E43 Unspecified severe protein-calorie malnutrition: Secondary | ICD-10-CM | POA: Insufficient documentation

## 2016-01-09 DIAGNOSIS — R066 Hiccough: Secondary | ICD-10-CM | POA: Diagnosis not present

## 2016-01-09 DIAGNOSIS — Z87891 Personal history of nicotine dependence: Secondary | ICD-10-CM | POA: Diagnosis not present

## 2016-01-09 DIAGNOSIS — R109 Unspecified abdominal pain: Secondary | ICD-10-CM | POA: Diagnosis not present

## 2016-01-09 DIAGNOSIS — R1011 Right upper quadrant pain: Secondary | ICD-10-CM | POA: Diagnosis not present

## 2016-01-09 DIAGNOSIS — Z79899 Other long term (current) drug therapy: Secondary | ICD-10-CM | POA: Diagnosis not present

## 2016-01-09 DIAGNOSIS — I2510 Atherosclerotic heart disease of native coronary artery without angina pectoris: Secondary | ICD-10-CM | POA: Diagnosis not present

## 2016-01-09 DIAGNOSIS — E1122 Type 2 diabetes mellitus with diabetic chronic kidney disease: Secondary | ICD-10-CM | POA: Diagnosis not present

## 2016-01-09 DIAGNOSIS — I4510 Unspecified right bundle-branch block: Secondary | ICD-10-CM | POA: Diagnosis not present

## 2016-01-09 DIAGNOSIS — N183 Chronic kidney disease, stage 3 (moderate): Secondary | ICD-10-CM | POA: Diagnosis not present

## 2016-01-09 DIAGNOSIS — Z85828 Personal history of other malignant neoplasm of skin: Secondary | ICD-10-CM | POA: Diagnosis not present

## 2016-01-09 DIAGNOSIS — N400 Enlarged prostate without lower urinary tract symptoms: Secondary | ICD-10-CM | POA: Diagnosis not present

## 2016-01-09 DIAGNOSIS — K219 Gastro-esophageal reflux disease without esophagitis: Secondary | ICD-10-CM | POA: Diagnosis not present

## 2016-01-09 DIAGNOSIS — E871 Hypo-osmolality and hyponatremia: Secondary | ICD-10-CM | POA: Diagnosis not present

## 2016-01-09 DIAGNOSIS — Z87442 Personal history of urinary calculi: Secondary | ICD-10-CM | POA: Diagnosis not present

## 2016-01-09 DIAGNOSIS — R1310 Dysphagia, unspecified: Secondary | ICD-10-CM | POA: Diagnosis not present

## 2016-01-09 DIAGNOSIS — I129 Hypertensive chronic kidney disease with stage 1 through stage 4 chronic kidney disease, or unspecified chronic kidney disease: Secondary | ICD-10-CM | POA: Diagnosis not present

## 2016-01-09 LAB — COMPREHENSIVE METABOLIC PANEL
ALBUMIN: 2.4 g/dL — AB (ref 3.5–5.0)
ALT: 123 U/L — ABNORMAL HIGH (ref 17–63)
ANION GAP: 7 (ref 5–15)
AST: 79 U/L — AB (ref 15–41)
Alkaline Phosphatase: 67 U/L (ref 38–126)
BUN: 40 mg/dL — AB (ref 6–20)
CHLORIDE: 106 mmol/L (ref 101–111)
CO2: 19 mmol/L — AB (ref 22–32)
Calcium: 8.2 mg/dL — ABNORMAL LOW (ref 8.9–10.3)
Creatinine, Ser: 1.91 mg/dL — ABNORMAL HIGH (ref 0.61–1.24)
GFR calc Af Amer: 38 mL/min — ABNORMAL LOW (ref >60)
GFR calc non Af Amer: 33 mL/min — ABNORMAL LOW (ref >60)
GLUCOSE: 166 mg/dL — AB (ref 65–99)
POTASSIUM: 3.6 mmol/L (ref 3.5–5.1)
SODIUM: 132 mmol/L — AB (ref 135–145)
Total Bilirubin: 0.9 mg/dL (ref 0.3–1.2)
Total Protein: 5.6 g/dL — ABNORMAL LOW (ref 6.5–8.1)

## 2016-01-09 LAB — GLUCOSE, CAPILLARY
GLUCOSE-CAPILLARY: 99 mg/dL (ref 65–99)
Glucose-Capillary: 144 mg/dL — ABNORMAL HIGH (ref 65–99)
Glucose-Capillary: 162 mg/dL — ABNORMAL HIGH (ref 65–99)
Glucose-Capillary: 75 mg/dL (ref 65–99)

## 2016-01-09 LAB — TROPONIN I
TROPONIN I: 0.1 ng/mL — AB (ref <0.03)
TROPONIN I: 0.07 ng/mL — AB (ref <0.03)
TROPONIN I: 0.05 ng/mL — AB (ref <0.03)

## 2016-01-09 MED ORDER — PANTOPRAZOLE SODIUM 40 MG PO TBEC
40.0000 mg | DELAYED_RELEASE_TABLET | Freq: Two times a day (BID) | ORAL | Status: DC
  Administered 2016-01-09 – 2016-01-10 (×3): 40 mg via ORAL
  Filled 2016-01-09 (×3): qty 1

## 2016-01-09 MED ORDER — ENSURE ENLIVE PO LIQD
237.0000 mL | Freq: Two times a day (BID) | ORAL | Status: DC
  Administered 2016-01-09 – 2016-01-10 (×3): 237 mL via ORAL

## 2016-01-09 NOTE — Progress Notes (Signed)
CRITICAL VALUE ALERT  Critical value received: Troponin 0.05  Date of notification: 01/09/2016  Time of notification:  [id number]  Critical value read back:YES  Nurse who received alert: Vista Deck  MD notified (1st page):  Dr. Jerilee Hoh  Time of first page: [phone]

## 2016-01-09 NOTE — Progress Notes (Signed)
Received critical lab value. Patient's troponin level 0.05. MD notified via E-Page.

## 2016-01-09 NOTE — Progress Notes (Signed)
PROGRESS NOTE    Maurice Ross  [id number] DOB: 10/26/1940 DOA: 01/08/2016 PCP: Rory Percy, MD     Brief Narrative:  75 y/o man admitted from home on 10/23. Presented with hiccups. Found to have an elevated troponin in the ED and admitted for work up.   Assessment & Plan:   Principal Problem:   Intractable hiccups Active Problems:   GERD (gastroesophageal reflux disease)   DM (diabetes mellitus) ([place])   History of kidney stones   RBBB   Hyperlipidemia LDL goal <70   Acute kidney injury superimposed on chronic kidney disease (HCC)   Hyponatremia   Elevated troponin   Essential hypertension   Protein-calorie malnutrition, severe   Hiccups -Much improved with thorazine.  Dysphagia -Has been having difficulty with solids for some time. -ST consult requested.  Elevated Troponin -Minimal elevation, no acute ischemic changes on EKG. -ECHO pending. -As long as EF ok and no WMA, do not anticipate further cardiac work up this admission.  Acute on CKD Stage III-IV -Cr is close to baseline of 1.80 after IVF.  HTN -Well controlled.  Hyperlipidemia -Continue statin.  DM -Well controlled.    DVT prophylaxis: lovenox Code Status: full code Family Communication: wife at bedside Disposition Plan: home in am  Consultants:   none  Procedures:   none  Antimicrobials:   none    Subjective: Feels well, other than difficulty swallowing; would like diet advanced to soft solids.  Objective: Vitals:   01/08/16 2124 01/09/16 0648 01/09/16 1300 01/09/16 1411  BP: 135/72 135/70 120/65 140/64  Pulse: 86 68 75 89  Resp: 20 20 20 20   Temp: 100.2 F (37.9 C) 98.3 F (36.8 C) 98.8 F (37.1 C) 99.9 F (37.7 C)  TempSrc: Oral Oral Oral Oral  SpO2: 98% 99% 96% 100%  Weight:      Height:        Intake/Output Summary (Last 24 hours) at 01/09/16 1720 Last data filed at 01/09/16 1400  Gross per 24 hour  Intake             2925 ml  Output               750 ml  Net             2175 ml   Filed Weights   01/08/16 0804 01/08/16 1809  Weight: 85.3 kg (188 lb) 85.3 kg (188 lb)    Examination:  General exam: Alert, awake, oriented x 3 Respiratory system: Clear to auscultation. Respiratory effort normal. Cardiovascular system:RRR. No murmurs, rubs, gallops. Gastrointestinal system: Abdomen is nondistended, soft and nontender. No organomegaly or masses felt. Normal bowel sounds heard. Central nervous system: Alert and oriented. No focal neurological deficits. Extremities: No C/C/E, +pedal pulses Skin: No rashes, lesions or ulcers Psychiatry: Judgement and insight appear normal. Mood & affect appropriate.     Data Reviewed: I have personally reviewed following labs and imaging studies  CBC:  Recent Labs Lab 01/08/16 0838  WBC 10.4  NEUTROABS 7.7  HGB 15.8  HCT 44.8  MCV 84.2  PLT [id number]   Basic Metabolic Panel:  Recent Labs Lab 01/08/16 0838 01/09/16 0310  NA 130* 132*  K 4.0 3.6  CL 101 106  CO2 21* 19*  GLUCOSE 230* 166*  BUN 44* 40*  CREATININE 2.41* 1.91*  CALCIUM 8.8* 8.2*   GFR: Estimated Creatinine Clearance: 36.1 mL/min (by C-G formula based on SCr of 1.91 mg/dL (H)). Liver Function Tests:  Recent Labs Lab 01/08/16  [id number] 01/09/16 0310  AST 79* 79*  ALT 105* 123*  ALKPHOS 70 67  BILITOT 1.0 0.9  PROT 6.2* 5.6*  ALBUMIN 2.8* 2.4*    Recent Labs Lab 01/08/16 0838  LIPASE 35   No results for input(s): AMMONIA in the last 168 hours. Coagulation Profile: No results for input(s): INR, PROTIME in the last 168 hours. Cardiac Enzymes:  Recent Labs Lab 01/08/16 1331 01/08/16 2057 01/09/16 0310 01/09/16 0903 01/09/16 1445  TROPONINI 0.06* 0.08* 0.10* 0.07* 0.05*   BNP (last 3 results) No results for input(s): PROBNP in the last 8760 hours. HbA1C: No results for input(s): HGBA1C in the last 72 hours. CBG:  Recent Labs Lab 01/08/16 2043 01/09/16 0746 01/09/16 1135 01/09/16 1629  GLUCAP  186* 144* 162* 99   Lipid Profile: No results for input(s): CHOL, HDL, LDLCALC, TRIG, CHOLHDL, LDLDIRECT in the last 72 hours. Thyroid Function Tests: No results for input(s): TSH, T4TOTAL, FREET4, T3FREE, THYROIDAB in the last 72 hours. Anemia Panel: No results for input(s): VITAMINB12, FOLATE, FERRITIN, TIBC, IRON, RETICCTPCT in the last 72 hours. Urine analysis:    Component Value Date/Time   COLORURINE YELLOW 01/08/2016 [place] 01/08/2016 0951   LABSPEC 1.025 01/08/2016 0951   PHURINE 5.5 01/08/2016 0951   GLUCOSEU 500 (A) 01/08/2016 0951   HGBUR LARGE (A) 01/08/2016 0951   BILIRUBINUR NEGATIVE 01/08/2016 0951   KETONESUR 15 (A) 01/08/2016 0951   PROTEINUR 30 (A) 01/08/2016 0951   NITRITE NEGATIVE 01/08/2016 0951   LEUKOCYTESUR NEGATIVE 01/08/2016 0951   Sepsis Labs: @LABRCNTIP (procalcitonin:4,lacticidven:4)  )No results found for this or any previous visit (from the past 240 hour(s)).       Radiology Studies: Ct Abdomen Pelvis Wo Contrast  Result Date: 01/08/2016 CLINICAL DATA:  Worsening abdominal pain, nausea, and hiccups for 1 week. Recent cystoscopy and lithotripsy for right renal pelvic stone. EXAM: CT ABDOMEN AND PELVIS WITHOUT CONTRAST TECHNIQUE: Multidetector CT imaging of the abdomen and pelvis was performed following the standard protocol without IV contrast. COMPARISON:  09/16/2015 FINDINGS: Lower chest: No acute findings. Stable cardiomegaly and coronary artery calcification. Hepatobiliary: No mass visualized on this unenhanced exam. Gallbladder is unremarkable. Pancreas: No mass or inflammatory process visualized on this unenhanced exam. Spleen:  Within normal limits in size. Adrenals/Urinary tract: Previously seen calculus in the right renal pelvis is no longer visualized. Mild right hydronephrosis and ureterectasis seen with 2 tiny 2-3 mm stone fragments in the mid right ureter at the level of the iliac vessels on image 54/4, and at least 2  stone fragments in the distal right ureter near the ureterovesical junction, largest measuring 5 mm. New right perinephric and retroperitoneal soft tissue stranding is seen. A small low attenuation subcapsular fluid collection is seen along the lateral aspect of the right kidney measuring 1.6 x 4.9 cm which is new since previous study, and most likely secondary to recent lithotripsy. Tiny bilateral fluid attenuation renal cysts again noted. No evidence of left ureteral calculi or left-sided hydronephrosis. No bladder calculi identified. Stomach/Bowel: No evidence of obstruction, inflammatory process, or abnormal fluid collections. Scattered colonic diverticulosis, without evidence diverticulitis. Vascular/Lymphatic: No pathologically enlarged lymph nodes identified. No evidence of abdominal aortic aneurysm. Aortic atherosclerosis. Reproductive:  Stable moderately enlarged prostate. Other:  None. Musculoskeletal:  No suspicious bone lesions identified. IMPRESSION: New right perinephric and retroperitoneal soft tissue stranding with increased mild right hydroureteronephrosis due to several stone fragments in the mid and distal right ureter. Largest stone fragment measures 5 mm near  the ureterovesical junction. Small low-attenuation right renal subcapsular fluid collection measuring 1.6 x 4.9 cm, most likely secondary to recent lithotripsy. Stable moderately enlarged prostate gland. Electronically Signed   By: Earle Gell M.D.   On: 01/08/2016 12:07   US Abdomen Limited  Result Date: 01/08/2016 CLINICAL DATA:  Right upper quadrant pain with nausea for 1 week. Hiccups. Elevated liver function tests. EXAM: US ABDOMEN LIMITED - RIGHT UPPER QUADRANT COMPARISON:  CT abdomen and pelvis 01/08/2016 FINDINGS: Gallbladder: No gallstones or wall thickening visualized. No sonographic Murphy sign noted by sonographer. Common bile duct: Diameter: 3 mm Liver: No focal lesion identified. Within normal limits in parenchymal  echogenicity. IMPRESSION: Unremarkable right upper quadrant ultrasound. Electronically Signed   By: Logan Bores M.D.   On: 01/08/2016 16:16        Scheduled Meds: . aspirin  81 mg Oral Daily  . carvedilol  3.125 mg Oral BID WC  . enoxaparin (LOVENOX) injection  30 mg Subcutaneous Q24H  . feeding supplement (ENSURE ENLIVE)  237 mL Oral BID BM  . insulin aspart  0-15 Units Subcutaneous TID WC  . insulin aspart protamine- aspart  40 Units Subcutaneous BID WC  . oxybutynin  5 mg Oral TID  . pantoprazole  40 mg Oral BID AC  . potassium citrate  10 mEq Oral BID  . rosuvastatin  40 mg Oral QPM  . sodium chloride flush  3 mL Intravenous Q12H  . sucralfate  1 g Oral TID WC & HS  . sulfamethoxazole-trimethoprim  1 tablet Oral BID   Continuous Infusions: . sodium chloride 150 mL/hr at 01/09/16 0600     LOS: 1 day    Time spent: 25 minutes. Greater than 50% of this time was spent in direct contact with the patient coordinating care.     Lelon Frohlich, MD [facility] Pager [phone]  If 7PM-7AM, please contact night-coverage [email or web address] Password [email or web address] 01/09/2016, 5:20 PM

## 2016-01-09 NOTE — Progress Notes (Signed)
Pt found on bathroom floor by CNA. Pt stated that he lost his balance while using the bathroom. No visible bruising, Vitals are as follows:   01/09/16 1411  Vitals  Temp 99.9 F (37.7 C)  Temp Source Oral  BP 140/64  BP Location Right Arm  BP Method Automatic  Patient Position (if appropriate) Lying  Pulse Rate 89  Pulse Rate Source Dinamap  Resp 20  Oxygen Therapy  SpO2 100 %  O2 Device Room Air   Patient denies pain or discomfort at this time. Will continue to monitor patient throughout the shift. MD paged.

## 2016-01-09 NOTE — Progress Notes (Signed)
Initial Nutrition Assessment  DOCUMENTATION CODES:  Severe malnutrition in context of acute illness/injury   Pt meets criteria for Severe MALNUTRITION in the context of Acute Illness as evidenced by a wt loss of >2% bw in 1 week and an energy intake that met < or equal to 50% of needs for > or equal to 5 days   INTERVENTION:  Ensure Enlive po BID, each supplement provides 350 kcal and 20 grams of protein  NUTRITION DIAGNOSIS:  Inadequate oral intake related to Pain with PO intake as evidenced by an estimated energy intake that met < or equal to 50% of needs for > or equal to 5 days   GOAL:  Patient will meet greater than or equal to 90% of their needs  MONITOR:  PO intake, Supplement acceptance, Diet advancement, Labs  REASON FOR ASSESSMENT:  Malnutrition Screening Tool    ASSESSMENT:  75 y/o male PMHx ASCVD, HTN, DM, HLD, CKD3, GERD. Underwent recent lithotripsy and cytoscopy (10/16). Presents with anorexia, pain with PO intake and recurrent hiccups since his procedure. Admitted for evaluation  Encounter from earlier this morning.   Spoke with pt and wife. Patient states that he has been largely restricted to liquids as solid foods cause considerable pain. He describes the pain as a "burning" sensation, akin to acid reflux. Initially, he had absolutely no appetite after his operation, though he says this has improved and his appetite has started to return. Additionally, pt was severely constipated. He had his first BM in >1 week this morning and says he feels much better. He denies any n/v. He did not take vitamins at home  He reports UBW is 193 lbs. He was 198 at time of his surgery. He has lost 10lbs since then. This is a clinically significant loss  He had a clear liquid breakfast this morning and states he tolerated it well.   He was agreeable to [facility], but since speaking with, his diet has advanced. WIll order Ensure Enlive q 24 hrs.    NFPE: WDL  Labs reviewed:  Albumin:2.4, Elevated liver enzymes, Na:132 Medications: insulin, ppi, potassium citrate, carafate   Recent Labs Lab 01/08/16 0838 01/09/16 0310  NA 130* 132*  K 4.0 3.6  CL 101 106  CO2 21* 19*  BUN 44* 40*  CREATININE 2.41* 1.91*  CALCIUM 8.8* 8.2*  GLUCOSE 230* 166*   Diet Order:  DIET SOFT Room service appropriate? Yes; Fluid consistency: Thin  Skin:  Surgical incision  Last BM:  10/24  Height:  Ht Readings from Last 1 Encounters:  01/08/16 '5\' 8"'  (1.727 m)   Weight:  Wt Readings from Last 1 Encounters:  01/08/16 188 lb (85.3 kg)   Wt Readings from Last 10 Encounters:  01/08/16 188 lb (85.3 kg)  01/01/16 198 lb 5 oz (90 kg)  10/30/15 196 lb (88.9 kg)  09/16/15 196 lb (88.9 kg)  04/10/15 199 lb 2 oz (90.3 kg)  04/25/14 191 lb 4.8 oz (86.8 kg)  01/18/14 191 lb (86.6 kg)  12/18/12 201 lb 3.2 oz (91.3 kg)  01/24/12 197 lb (89.4 kg)  12/30/11 197 lb 1.6 oz (89.4 kg)   Ideal Body Weight:  70 kg  BMI:  Body mass index is 28.59 kg/m.  Estimated Nutritional Needs:  Kcal:  1700-1950 (20-23 kcal/kg bw) Protein:  70-85 g Pro (1-1.2 g/kg bw) Fluid:  1.7-2 liters (1 ml/kcal)  EDUCATION NEEDS:  No education needs identified at this time  Burtis Junes RD, LDN, [place] [facility]  Pager: [phone] 01/09/2016 3:45 PM

## 2016-01-10 ENCOUNTER — Inpatient Hospital Stay (HOSPITAL_COMMUNITY): Payer: Medicare Other

## 2016-01-10 DIAGNOSIS — Z87442 Personal history of urinary calculi: Secondary | ICD-10-CM | POA: Diagnosis not present

## 2016-01-10 DIAGNOSIS — Z9842 Cataract extraction status, left eye: Secondary | ICD-10-CM | POA: Diagnosis not present

## 2016-01-10 DIAGNOSIS — R7989 Other specified abnormal findings of blood chemistry: Secondary | ICD-10-CM | POA: Diagnosis not present

## 2016-01-10 DIAGNOSIS — N183 Chronic kidney disease, stage 3 (moderate): Secondary | ICD-10-CM | POA: Diagnosis not present

## 2016-01-10 DIAGNOSIS — N179 Acute kidney failure, unspecified: Secondary | ICD-10-CM

## 2016-01-10 DIAGNOSIS — E43 Unspecified severe protein-calorie malnutrition: Secondary | ICD-10-CM

## 2016-01-10 DIAGNOSIS — E1122 Type 2 diabetes mellitus with diabetic chronic kidney disease: Secondary | ICD-10-CM | POA: Diagnosis not present

## 2016-01-10 DIAGNOSIS — R066 Hiccough: Secondary | ICD-10-CM

## 2016-01-10 DIAGNOSIS — Z79899 Other long term (current) drug therapy: Secondary | ICD-10-CM | POA: Diagnosis not present

## 2016-01-10 DIAGNOSIS — Z85828 Personal history of other malignant neoplasm of skin: Secondary | ICD-10-CM | POA: Diagnosis not present

## 2016-01-10 DIAGNOSIS — I252 Old myocardial infarction: Secondary | ICD-10-CM | POA: Diagnosis not present

## 2016-01-10 DIAGNOSIS — Z87891 Personal history of nicotine dependence: Secondary | ICD-10-CM | POA: Diagnosis not present

## 2016-01-10 DIAGNOSIS — I2510 Atherosclerotic heart disease of native coronary artery without angina pectoris: Secondary | ICD-10-CM | POA: Diagnosis not present

## 2016-01-10 DIAGNOSIS — Z955 Presence of coronary angioplasty implant and graft: Secondary | ICD-10-CM | POA: Diagnosis not present

## 2016-01-10 DIAGNOSIS — Z794 Long term (current) use of insulin: Secondary | ICD-10-CM | POA: Diagnosis not present

## 2016-01-10 DIAGNOSIS — E119 Type 2 diabetes mellitus without complications: Secondary | ICD-10-CM | POA: Diagnosis not present

## 2016-01-10 DIAGNOSIS — E785 Hyperlipidemia, unspecified: Secondary | ICD-10-CM | POA: Diagnosis not present

## 2016-01-10 DIAGNOSIS — N189 Chronic kidney disease, unspecified: Secondary | ICD-10-CM

## 2016-01-10 DIAGNOSIS — I129 Hypertensive chronic kidney disease with stage 1 through stage 4 chronic kidney disease, or unspecified chronic kidney disease: Secondary | ICD-10-CM | POA: Diagnosis not present

## 2016-01-10 DIAGNOSIS — I4510 Unspecified right bundle-branch block: Secondary | ICD-10-CM | POA: Diagnosis not present

## 2016-01-10 DIAGNOSIS — R748 Abnormal levels of other serum enzymes: Secondary | ICD-10-CM

## 2016-01-10 DIAGNOSIS — Z7982 Long term (current) use of aspirin: Secondary | ICD-10-CM | POA: Diagnosis not present

## 2016-01-10 DIAGNOSIS — R109 Unspecified abdominal pain: Secondary | ICD-10-CM

## 2016-01-10 DIAGNOSIS — E871 Hypo-osmolality and hyponatremia: Secondary | ICD-10-CM | POA: Diagnosis not present

## 2016-01-10 DIAGNOSIS — R1310 Dysphagia, unspecified: Secondary | ICD-10-CM | POA: Diagnosis not present

## 2016-01-10 DIAGNOSIS — K219 Gastro-esophageal reflux disease without esophagitis: Secondary | ICD-10-CM | POA: Diagnosis not present

## 2016-01-10 DIAGNOSIS — N400 Enlarged prostate without lower urinary tract symptoms: Secondary | ICD-10-CM | POA: Diagnosis not present

## 2016-01-10 DIAGNOSIS — R1011 Right upper quadrant pain: Secondary | ICD-10-CM | POA: Diagnosis not present

## 2016-01-10 LAB — ECHOCARDIOGRAM COMPLETE
Height: 68 in
Weight: 3008 oz

## 2016-01-10 LAB — GLUCOSE, CAPILLARY
GLUCOSE-CAPILLARY: 112 mg/dL — AB (ref 65–99)
Glucose-Capillary: 162 mg/dL — ABNORMAL HIGH (ref 65–99)
Glucose-Capillary: 68 mg/dL (ref 65–99)

## 2016-01-10 MED ORDER — PERFLUTREN LIPID MICROSPHERE
1.0000 mL | INTRAVENOUS | Status: AC | PRN
  Administered 2016-01-10: 1 mL via INTRAVENOUS
  Administered 2016-01-10: 2 mL via INTRAVENOUS

## 2016-01-10 NOTE — Progress Notes (Signed)
*  PRELIMINARY RESULTS* Echocardiogram 2D Echocardiogram has been performed with Definity.  Samuel Germany 01/10/2016, 1:22 PM

## 2016-01-10 NOTE — Evaluation (Addendum)
Clinical/Bedside Swallow Evaluation Patient Details  Name: Maurice Ross MRN: [id number] Date of Birth: [birth year]  Today's Date: 01/10/2016 Time: SLP Start Time (ACUTE ONLY): 1315 SLP Stop Time (ACUTE ONLY): 1400 SLP Time Calculation (min) (ACUTE ONLY): 45 min  Past Medical History:  Past Medical History:  Diagnosis Date  . CKD (chronic kidney disease), stage III   . Coronary artery disease CARDIOLOGIST-  DR Shelva Majestic   Coronary stent 1997, Last cath 1998, Last Nuc 10/24/2011-no change from previous, Last Echo 2011 EF 45%  . Diabetes mellitus type 2, uncontrolled ([place])    per pt last A1c 8.8 in Aug 2017  . GERD (gastroesophageal reflux disease)   . History of acute anterior wall MI    1997  . History of adenomatous polyp of colon    adenomatous 2004;   tubular adenoma 2013  . History of basal cell carcinoma excision    05-24-2015  left cheek  . History of esophageal dilatation   . Hyperlipidemia    lipomet analysis LDL particle # at 786  . 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 Renal calculus, right   . Renal cyst   . S/P coronary artery stent placement    1997  --  PCI  and stenting to LAD  . Wears glasses   . Wears hearing aid    bilateral   Past Surgical History:  Past Surgical History:  Procedure Laterality Date  . BALLOON DILATION  01/24/2012   Procedure: BALLOON DILATION;  Surgeon: Rogene Houston, MD;  [facility];  Service: Endoscopy;  Laterality: N/A;  . CARDIOVASCULAR STRESS TEST  10-24-2011   dr Claiborne Billings   Low risk nuclear study w/ mild to moderate perfustion defect due to infarct/scar w/  mild perinfarct ischemia in the apical septal and apical regions/  normal LV function and mild hypokinesis in the apical and apical septal (no sign. change compared to previous study()  . CATARACT EXTRACTION W/ INTRAOCULAR LENS  IMPLANT, BILATERAL  2015  . COLONOSCOPY WITH ESOPHAGOGASTRODUODENOSCOPY (EGD)  01/24/2012   Procedure:  COLONOSCOPY WITH ESOPHAGOGASTRODUODENOSCOPY (EGD);  Surgeon: Rogene Houston, MD;  [facility];  Service: Endoscopy;  Laterality: N/A;  955  . CORONARY ANGIOPLASTY  06/08/1996   dr Shelva Majestic   Balloon angioplasty to  pLAD for in-stent restenosis, no change mLAD 30-50%/  narrowing  midRCA 40-50%/  mild ectasia of LCX w/ 30-50%  proximal and marginal narrowing/  mild LV dysfunction w/ anterolateral hypocontractility  . CORONARY ANGIOPLASTY WITH STENT PLACEMENT  09-15-1995    dr Claiborne Billings   PTCA/ Stenting (palmaz-schatz)  to proximal LAD  . CYSTOSCOPY WITH STENT PLACEMENT Right 01/01/2016   Procedure: CYSTOSCOPY WITH STENT PLACEMENT;  Surgeon: Franchot Gallo, MD;  Location: [facility];  Service: Urology;  Laterality: Right;  . CYSTOSCOPY/RETROGRADE/URETEROSCOPY/STONE EXTRACTION WITH BASKET Right 01/01/2016   Procedure: CYSTOSCOPY/RETROGRADE/URETEROSCOPY;  Surgeon: Franchot Gallo, MD;  Location: [facility];  Service: Urology;  Laterality: Right;  . EXTRACORPOREAL SHOCK WAVE LITHOTRIPSY  10-30-2015;   11-25-2011  . HOLMIUM LASER APPLICATION Right 0000000   Procedure: HOLMIUM LASER APPLICATION;  Surgeon: Franchot Gallo, MD;  Location: [facility];  Service: Urology;  Laterality: Right;  . LUMBAR MICRODISCECTOMY  1998   L5 -- S1  . MALONEY DILATION  01/24/2012   Procedure: MALONEY DILATION;  Surgeon: Rogene Houston, MD;  [facility];  Service: Endoscopy;  Laterality: N/A;  .  SAVORY DILATION  01/24/2012   Procedure: SAVORY DILATION;  Surgeon: Rogene Houston, MD;  [facility];  Service: Endoscopy;  Laterality: N/A;  . TRANSTHORACIC ECHOCARDIOGRAM  01/25/2014   mild concentric LVH, ef Q000111Q, grade 1 diastolic dysfunction/  mild PR   HPI:  Maurice Ross is a 75 y.o. male with medical history significant of ASCVD, DM, HTN, HLD, GERD, stage III CKD, and recent lithotripsy and cystoscopy presenting with recurrent  hiccups and anorexia.  Following urologic procedure last Monday (10/16), he developed n/v.  Started hiccuping.  Midepigastric pain with trying to take bite of anything.  He had been discharged Monday with a foley due to urinary retention, came back Tuesday for recheck and was able to have foley removed.  Urinary stent removed Thursday by wife - has not required much pain medication.  Unable to eat since procedure, plus hiccups lasting 60-90 minutes and recurrent.  With continuous sipping water, it does control the hiccups.  Every time he returned to bed, the hiccups would recur and wake him from sleep.  +constipation.  Used suppository Wednesday and Thursday without meaningful effect.   Assessment / Plan / Recommendation Clinical Impression  Pt seen at bedside for clinical swallow evaluation with family present. Pt reports that he has had hiccups and odynophagia since his procedure last week (lithotripsy and cystoscopy). He describes nausea and vomiting soon after he returned home and was unable to swallow solid foods. Hiccups are now managed with Thorazine and he has been able to consume breakfast and lunch today. He awoke in the night and took a sip of water and describes excruciating pain following the sip. Pt states that pain occurs with solids and liquids and he is unsure whether hot/cold sensation plays a part. Oral motor examination is WNL and pt shows no signs of symptoms of aspiration with consistencies and textures presented. SLP presented room temperature liquids first- no distress, then ice cold water- no distress, hot coffee- no distress, puree- mild distress/pain in esophagus, followed with hot coffee- discomfort eased, and finally presented with graham crackers- no discomfort noted.  Pt's complaints are more consistent with esophageal phase dysphagia. Consider esophageal spasm and/or irritated esophagus from recent vomiting and excessive hiccups. Pt denies history of heartburn, but does take  Zantac at times. Pt may benefit from GI consult (he has seen Dr. Laural Golden in the past) and/or GI assessment (barium swallow vs EGD) if symptoms persist. Pt and wife educated on reflux precautions and cued to eat slowly, masticate solids thoroughly, and take small sips. SLP will sign off, please reconsult if needed.    Aspiration Risk  No limitations    Diet Recommendation Dysphagia 3 (Mech soft);Thin liquid   Liquid Administration via: Cup Medication Administration: Whole meds with liquid Supervision: Patient able to self feed Compensations: Slow rate;Small sips/bites;Follow solids with liquid Postural Changes: Seated upright at 90 degrees;Remain upright for at least 30 minutes after po intake    Other  Recommendations Recommended Consults: Consider GI evaluation;Consider esophageal assessment Oral Care Recommendations: Oral care BID;Patient independent with oral care Other Recommendations: Clarify dietary restrictions   Follow up Recommendations None      Frequency and Duration  N/A         Prognosis Prognosis for Safe Diet Advancement: Good      Swallow Study   General Date of Onset: 01/08/16 HPI: KHALED WHATCOTT is a 75 y.o. male with medical history significant of ASCVD, DM, HTN, HLD, GERD, stage III CKD,  and recent lithotripsy and cystoscopy presenting with recurrent hiccups and anorexia.  Following urologic procedure last Monday (10/16), he developed n/v.  Started hiccuping.  Midepigastric pain with trying to take bite of anything.  He had been discharged Monday with a foley due to urinary retention, came back Tuesday for recheck and was able to have foley removed.  Urinary stent removed Thursday by wife - has not required much pain medication.  Unable to eat since procedure, plus hiccups lasting 60-90 minutes and recurrent.  With continuous sipping water, it does control the hiccups.  Every time he returned to bed, the hiccups would recur and wake him from sleep.  +constipation.   Used suppository Wednesday and Thursday without meaningful effect. Type of Study: Bedside Swallow Evaluation Previous Swallow Assessment: None on record Diet Prior to this Study: Regular;Thin liquids Temperature Spikes Noted: No Respiratory Status: Room air History of Recent Intubation: Yes Length of Intubations (days): 1 days (ETT for recent procedure ) Date extubated: 01/01/16 Behavior/Cognition: Alert;Cooperative;Pleasant mood Oral Cavity Assessment: Within Functional Limits (mild coating on tongue) Oral Care Completed by SLP: No Oral Cavity - Dentition: Adequate natural dentition Vision: Functional for self-feeding Self-Feeding Abilities: Able to feed self Patient Positioning: Upright in bed Baseline Vocal Quality: Normal Volitional Cough: Strong Volitional Swallow: Able to elicit    Oral/Motor/Sensory Function     Ice Chips     Thin Liquid Thin Liquid: Within functional limits Presentation: Cup;Self Fed    Nectar Thick Nectar Thick Liquid: Not tested   Honey Thick Honey Thick Liquid: Not tested   Puree Puree: Within functional limits Presentation: Spoon (mild discomfort in mid chest)   Solid   G-Code Clinical judgment Swallowing Initial: CI Goal: CI Discharge: CI  Thank you,  Genene Churn, CCC-SLP [phone]    Solid: Within functional limits Presentation: Self Fed        Kaylany Tesoriero 01/10/2016,2:08 PM

## 2016-01-10 NOTE — Progress Notes (Signed)
D/C instructions given to pt. Verbalized understanding. IV removed, WNL. Pt finishing supper then wife is taking him home.

## 2016-01-10 NOTE — Progress Notes (Signed)
PROGRESS NOTE  Maurice Ross [id number] DOB: 08/29/1940 DOA: 01/08/2016 PCP: Rory Percy, MD  Brief Narrative: 75 year old man with complicated past medical history who developed cups, anorexia, nausea, vomiting status post urologic procedure 10/16. Associated with midepigastric pain.  Assessment/Plan: 1. Epigastric abdominal pain, right upper quadrant pain, hiccups. Resolved. Etiology unclear, possible reaction to recent anesthesia. Has not required any Thorazine recently. 2. Dysphagia. Resolved. Oropharyngeal swallow appears to be intact. GI etiology it postulated by speech therapy but the patient currently denies any complaints. He is tolerating a diet. 3. Acute kidney injury superimposed on chronic kidney disease stage III. Prerenal azotemia suspected secondary to anorexia. Resolved with IV fluids. Associated mild hyponatremia has improved. 4. Right bundle branch block with mildly elevated troponin. Troponins flat. No acute changes on EKG. Echocardiogram noted. Suspect troponin leak as the patient was not symptomatic. Close outpatient follow-up with Dr. Claiborne Billings, consider outpatient stress. 5. Elevated LFTs, AST, ALT. Normal alkaline phosphatase and total bilirubin.right upper quadrant ultrasound unremarkable. CT abdomen and pelvis unremarkable. Symptoms resolved. Etiology and significance unclear. Follow-up as an outpatient. 6. DM. Blood sugar stable. 7. Recent lithotripsy for stone 8. Severe malnutrition   Doing quite well, all acute symptoms resolved.  Home today, follow-up with cardiology as an outpatient  Follow-up elevated LFTs  Family Communication: wife and son at bedside  Murray Hodgkins, MD  [facility] Direct contact: [phone] --[place]  --[email or web address]; password [email or web address]  7PM-7AM contact night coverage as above 01/10/2016, 4:03 PM  LOS: 2 days   Consultants:  ST: Dysphagia 3 (Mech soft);Thin liquid   Procedures:  Echo Impressions:  -  Mild LVH with LVEF 50-55%. Mild hypokinesis of the mid to distal   anteroseptal wall. Grade 1 diastolic dysfunction. Mild left   atrial enlargement. Mild mitral annular calcification. Trivial   tricuspid regurgitation with PASP 20 mmHg..  Interval history/Subjective: Feels better. No difficulty swallowing, no pain with swallowing, no abdominal pain, no hiccups. Tolerating food, a dinner last night, breakfast this morning, lunch today. Wants to go home.  Objective: Vitals:   01/09/16 1411 01/09/16 2100 01/10/16 0500 01/10/16 1300  BP: 140/64 125/60 128/72 121/60  Pulse: 89 71 68 63  Resp: 20 20 20 20   Temp: 99.9 F (37.7 C) 98.5 F (36.9 C) 98.6 F (37 C) 98.2 F (36.8 C)  TempSrc: Oral Oral Oral Oral  SpO2: 100% 100% 96% 95%  Weight:      Height:        Intake/Output Summary (Last 24 hours) at 01/10/16 1603 Last data filed at 01/10/16 0500  Gross per 24 hour  Intake             2190 ml  Output              300 ml  Net             1890 ml     Filed Weights   01/08/16 0804 01/08/16 1809  Weight: 85.3 kg (188 lb) 85.3 kg (188 lb)    Exam:    Constitutional:  . Appears calm and comfortable Respiratory:  . CTA bilaterally, no w/r/r.  . Respiratory effort normal. No retractions or accessory muscle use Cardiovascular:  . RRR, no m/r/g . No LE extremity edema   Abdomen:  . no tenderness or masses Psychiatric:  . judgement and insight appear normal . Mental status o Mood, affect appropriate  I have personally reviewed following labs and imaging studies:  No lab data today  Troponins  flat during hospitalization  Renal function improved to baseline, 1.91  Stable elevation of AST and ALT of unclear significance  Scheduled Meds: . aspirin  81 mg Oral Daily  . carvedilol  3.125 mg Oral BID WC  . enoxaparin (LOVENOX) injection  30 mg Subcutaneous Q24H  . feeding supplement (ENSURE ENLIVE)  237 mL Oral BID BM  . insulin aspart  0-15 Units Subcutaneous TID WC  .  insulin aspart protamine- aspart  40 Units Subcutaneous BID WC  . oxybutynin  5 mg Oral TID  . pantoprazole  40 mg Oral BID AC  . potassium citrate  10 mEq Oral BID  . rosuvastatin  40 mg Oral QPM  . sodium chloride flush  3 mL Intravenous Q12H  . sucralfate  1 g Oral TID WC & HS  . sulfamethoxazole-trimethoprim  1 tablet Oral BID   Continuous Infusions: . sodium chloride 150 mL/hr at 01/10/16 1207    Principal Problem:   Intractable hiccups Active Problems:   GERD (gastroesophageal reflux disease)   DM (diabetes mellitus) ([place])   History of kidney stones   RBBB   Hyperlipidemia LDL goal <70   Acute kidney injury superimposed on chronic kidney disease (HCC)   Hyponatremia   Elevated troponin   Essential hypertension   Protein-calorie malnutrition, severe   LOS: 2 days

## 2016-01-10 NOTE — Discharge Summary (Signed)
Physician Discharge Summary  Maurice Ross [id number] DOB: 04/29/1940 DOA: 01/08/2016  PCP: Rory Percy, MD  Admit date: 01/08/2016 Discharge date: 01/10/2016  Recommendations for Outpatient Follow-up:  1. Elevated LFTs of unclear significance, suggest repeat hepatic function panel as an outpatient 2. Severe malnutrition 3. Monitor for recurrent dysphagia 4. Mild troponin elevation not thought to be significant, defer follow-up to primary cardiologist Dr. Claiborne Billings    Follow-up Information    Rory Percy, MD. Schedule an appointment as soon as possible for a visit in 1 week(s).   Specialty:  Family Medicine Contact information: [place] [address] [phone]          Discharge Diagnoses:  1. Epigastric abdominal pain, right upper quadrant pain, hiccups 2. Elevated troponin/troponin leak 3. Acute kidney injury superimposed on chronic kidney disease stage III 4. Dysphagia 5. Elevated AST, ALT 6. Diabetes mellitus 7. Severe malnutrition  Discharge Condition: improved Disposition: home  Diet recommendation: Dysphagia 3 (Mech soft);Thin liquid   Filed Weights   01/08/16 0804 01/08/16 1809  Weight: 85.3 kg (188 lb) 85.3 kg (188 lb)    History of present illness:  75 year old man with complicated past medical history who developed cups, anorexia, nausea, vomiting status post urologic procedure 10/16. Associated with midepigastric pain.  Hospital Course:  Patient was treated with supportive care with gradual resolution of acute symptomatology including abdominal pain, hiccups, acute kidney injury. Troponins remained flat and were not felt to be significant. Etiology of presentation unclear but may have been related to recent procedure and anesthesia. Hospitalization was uncomplicated. Individual issues as below.  1. Epigastric abdominal pain, right upper quadrant pain, hiccups. Resolved. Etiology unclear, possible reaction to recent anesthesia. Has not  required any Thorazine recently. 2. Dysphagia. Resolved. Oropharyngeal swallow appears to be intact. GI etiology it postulated by speech therapy but the patient currently denies any complaints. He is tolerating a diet. 3. Acute kidney injury superimposed on chronic kidney disease stage III. Prerenal azotemia suspected secondary to anorexia. Resolved with IV fluids. Associated mild hyponatremia has improved. 4. Right bundle branch block with mildly elevated troponin. Troponins flat. No acute changes on EKG. Echocardiogram noted. Suspect troponin leak as the patient was not symptomatic. Close outpatient follow-up with Dr. Claiborne Billings, consider outpatient stress. 5. Elevated LFTs, AST, ALT. Normal alkaline phosphatase and total bilirubin.right upper quadrant ultrasound unremarkable. CT abdomen and pelvis unremarkable. Symptoms resolved. Etiology and significance unclear. Follow-up as an outpatient. 6. DM. Blood sugar stable. 7. Recent lithotripsy for stone 8. Severe malnutrition  Consultants:  ST: Dysphagia 3 (Mech soft);Thin liquid  Procedures:  Echo Impressions:  - Mild LVH with LVEF 50-55%. Mild hypokinesis of the mid to distal anteroseptal wall. Grade 1 diastolic dysfunction. Mild left atrial enlargement. Mild mitral annular calcification. Trivial tricuspid regurgitation with PASP 20 mmHg.Marland Kitchen  Discharge Instructions  Discharge Instructions    Diet - low sodium heart healthy    Complete by:  As directed    Discharge instructions    Complete by:  As directed    Call your physician or seek immediate medical attention for nausea, vomiting, shortness of breath, chest pain, hiccups or worsening of condition.   Increase activity slowly    Complete by:  As directed        Medication List    STOP taking these medications   sulfamethoxazole-trimethoprim 800-160 MG tablet Commonly known as:  BACTRIM DS,SEPTRA DS     TAKE these medications   aspirin 81 MG tablet Take 81 mg by  mouth  daily.   carvedilol 3.125 MG tablet Commonly known as:  COREG Take 1 tablet by mouth two  times daily   HUMALOG MIX 75/25 KWIKPEN [place] Inject 40 Units into the skin 2 (two) times daily before a meal.   lisinopril 10 MG tablet Commonly known as:  PRINIVIL,ZESTRIL Take 0.5 tablets by mouth every morning.   oxybutynin 5 MG tablet Commonly known as:  DITROPAN Take 1 tablet (5 mg total) by mouth 3 (three) times daily.   oxyCODONE-acetaminophen 5-325 MG tablet Commonly known as:  PERCOCET/ROXICET Take 1 tablet by mouth every 6 (six) hours as needed for severe pain.   potassium citrate 10 MEQ (1080 MG) SR tablet Commonly known as:  UROCIT-K Take 10 mEq by mouth 2 (two) times daily.   promethazine 25 MG suppository Commonly known as:  PHENERGAN Place 25 mg rectally every 6 (six) hours as needed for nausea or vomiting.   rosuvastatin 40 MG tablet Commonly known as:  CRESTOR Take 40 mg by mouth every evening.      No Known Allergies  The results of significant diagnostics from this hospitalization (including imaging, microbiology, ancillary and laboratory) are listed below for reference.    Significant Diagnostic Studies: Ct Abdomen Pelvis Wo Contrast  Result Date: 01/08/2016 CLINICAL DATA:  Worsening abdominal pain, nausea, and hiccups for 1 week. Recent cystoscopy and lithotripsy for right renal pelvic stone. EXAM: CT ABDOMEN AND PELVIS WITHOUT CONTRAST TECHNIQUE: Multidetector CT imaging of the abdomen and pelvis was performed following the standard protocol without IV contrast. COMPARISON:  09/16/2015 FINDINGS: Lower chest: No acute findings. Stable cardiomegaly and coronary artery calcification. Hepatobiliary: No mass visualized on this unenhanced exam. Gallbladder is unremarkable. Pancreas: No mass or inflammatory process visualized on this unenhanced exam. Spleen:  Within normal limits in size. Adrenals/Urinary tract: Previously seen calculus in the right renal pelvis is no  longer visualized. Mild right hydronephrosis and ureterectasis seen with 2 tiny 2-3 mm stone fragments in the mid right ureter at the level of the iliac vessels on image 54/4, and at least 2 stone fragments in the distal right ureter near the ureterovesical junction, largest measuring 5 mm. New right perinephric and retroperitoneal soft tissue stranding is seen. A small low attenuation subcapsular fluid collection is seen along the lateral aspect of the right kidney measuring 1.6 x 4.9 cm which is new since previous study, and most likely secondary to recent lithotripsy. Tiny bilateral fluid attenuation renal cysts again noted. No evidence of left ureteral calculi or left-sided hydronephrosis. No bladder calculi identified. Stomach/Bowel: No evidence of obstruction, inflammatory process, or abnormal fluid collections. Scattered colonic diverticulosis, without evidence diverticulitis. Vascular/Lymphatic: No pathologically enlarged lymph nodes identified. No evidence of abdominal aortic aneurysm. Aortic atherosclerosis. Reproductive:  Stable moderately enlarged prostate. Other:  None. Musculoskeletal:  No suspicious bone lesions identified. IMPRESSION: New right perinephric and retroperitoneal soft tissue stranding with increased mild right hydroureteronephrosis due to several stone fragments in the mid and distal right ureter. Largest stone fragment measures 5 mm near the ureterovesical junction. Small low-attenuation right renal subcapsular fluid collection measuring 1.6 x 4.9 cm, most likely secondary to recent lithotripsy. Stable moderately enlarged prostate gland. Electronically Signed   By: Earle Gell M.D.   On: 01/08/2016 12:07   US Abdomen Limited  Result Date: 01/08/2016 CLINICAL DATA:  Right upper quadrant pain with nausea for 1 week. Hiccups. Elevated liver function tests. EXAM: US ABDOMEN LIMITED - RIGHT UPPER QUADRANT COMPARISON:  CT abdomen and pelvis 01/08/2016 FINDINGS:  Gallbladder: No gallstones  or wall thickening visualized. No sonographic Murphy sign noted by sonographer. Common bile duct: Diameter: 3 mm Liver: No focal lesion identified. Within normal limits in parenchymal echogenicity. IMPRESSION: Unremarkable right upper quadrant ultrasound. Electronically Signed   By: Logan Bores M.D.   On: 01/08/2016 16:16   Labs: Basic Metabolic Panel:  Recent Labs Lab 01/08/16 0838 01/09/16 0310  NA 130* 132*  K 4.0 3.6  CL 101 106  CO2 21* 19*  GLUCOSE 230* 166*  BUN 44* 40*  CREATININE 2.41* 1.91*  CALCIUM 8.8* 8.2*   Liver Function Tests:  Recent Labs Lab 01/08/16 0838 01/09/16 0310  AST 79* 79*  ALT 105* 123*  ALKPHOS 70 67  BILITOT 1.0 0.9  PROT 6.2* 5.6*  ALBUMIN 2.8* 2.4*    Recent Labs Lab 01/08/16 0838  LIPASE 35   CBC:  Recent Labs Lab 01/08/16 0838  WBC 10.4  NEUTROABS 7.7  HGB 15.8  HCT 44.8  MCV 84.2  PLT 173   Cardiac Enzymes:  Recent Labs Lab 01/08/16 1331 01/08/16 2057 01/09/16 0310 01/09/16 0903 01/09/16 1445  TROPONINI 0.06* 0.08* 0.10* 0.07* 0.05*    CBG:  Recent Labs Lab 01/09/16 1629 01/09/16 2136 01/10/16 0724 01/10/16 1113 01/10/16 1639  GLUCAP 99 75 112* 162* 68    Principal Problem:   Elevated troponin Active Problems:   GERD (gastroesophageal reflux disease)   DM (diabetes mellitus) ([place])   History of kidney stones   RBBB   Hyperlipidemia LDL goal <70   Intractable hiccups   Acute kidney injury superimposed on chronic kidney disease ([place])   Hyponatremia   Essential hypertension   Protein-calorie malnutrition, severe   Abdominal pain   Time coordinating discharge: 35 minutes  Signed:  Murray Hodgkins, MD [facility] 01/10/2016, 7:03 PM

## 2016-01-22 DIAGNOSIS — I119 Hypertensive heart disease without heart failure: Secondary | ICD-10-CM | POA: Diagnosis not present

## 2016-01-22 DIAGNOSIS — E119 Type 2 diabetes mellitus without complications: Secondary | ICD-10-CM | POA: Diagnosis not present

## 2016-01-22 DIAGNOSIS — E7800 Pure hypercholesterolemia, unspecified: Secondary | ICD-10-CM | POA: Diagnosis not present

## 2016-01-22 DIAGNOSIS — N189 Chronic kidney disease, unspecified: Secondary | ICD-10-CM | POA: Diagnosis not present

## 2016-01-25 DIAGNOSIS — Z0000 Encounter for general adult medical examination without abnormal findings: Secondary | ICD-10-CM | POA: Diagnosis not present

## 2016-01-25 DIAGNOSIS — Z1389 Encounter for screening for other disorder: Secondary | ICD-10-CM | POA: Diagnosis not present

## 2016-01-25 DIAGNOSIS — Z23 Encounter for immunization: Secondary | ICD-10-CM | POA: Diagnosis not present

## 2016-01-25 DIAGNOSIS — N189 Chronic kidney disease, unspecified: Secondary | ICD-10-CM | POA: Diagnosis not present

## 2016-01-30 ENCOUNTER — Ambulatory Visit (INDEPENDENT_AMBULATORY_CARE_PROVIDER_SITE_OTHER): Payer: Medicare Other | Admitting: Urology

## 2016-01-30 DIAGNOSIS — N201 Calculus of ureter: Secondary | ICD-10-CM | POA: Diagnosis not present

## 2016-02-27 DIAGNOSIS — N189 Chronic kidney disease, unspecified: Secondary | ICD-10-CM | POA: Diagnosis not present

## 2016-02-27 DIAGNOSIS — E1165 Type 2 diabetes mellitus with hyperglycemia: Secondary | ICD-10-CM | POA: Diagnosis not present

## 2016-02-27 DIAGNOSIS — I10 Essential (primary) hypertension: Secondary | ICD-10-CM | POA: Diagnosis not present

## 2016-02-27 DIAGNOSIS — E7800 Pure hypercholesterolemia, unspecified: Secondary | ICD-10-CM | POA: Diagnosis not present

## 2016-03-19 ENCOUNTER — Other Ambulatory Visit: Payer: Self-pay | Admitting: Physician Assistant

## 2016-03-19 DIAGNOSIS — L570 Actinic keratosis: Secondary | ICD-10-CM | POA: Diagnosis not present

## 2016-03-19 DIAGNOSIS — D044 Carcinoma in situ of skin of scalp and neck: Secondary | ICD-10-CM | POA: Diagnosis not present

## 2016-03-19 DIAGNOSIS — D0439 Carcinoma in situ of skin of other parts of face: Secondary | ICD-10-CM | POA: Diagnosis not present

## 2016-03-19 DIAGNOSIS — C44629 Squamous cell carcinoma of skin of left upper limb, including shoulder: Secondary | ICD-10-CM | POA: Diagnosis not present

## 2016-04-11 DIAGNOSIS — D0439 Carcinoma in situ of skin of other parts of face: Secondary | ICD-10-CM | POA: Diagnosis not present

## 2016-04-11 DIAGNOSIS — D044 Carcinoma in situ of skin of scalp and neck: Secondary | ICD-10-CM | POA: Diagnosis not present

## 2016-04-11 DIAGNOSIS — L570 Actinic keratosis: Secondary | ICD-10-CM | POA: Diagnosis not present

## 2016-05-20 ENCOUNTER — Encounter: Payer: Self-pay | Admitting: Cardiovascular Disease

## 2016-05-20 ENCOUNTER — Ambulatory Visit (INDEPENDENT_AMBULATORY_CARE_PROVIDER_SITE_OTHER): Payer: Medicare Other | Admitting: Cardiovascular Disease

## 2016-05-20 VITALS — BP 100/64 | HR 56 | Ht 67.5 in | Wt 203.0 lb

## 2016-05-20 DIAGNOSIS — N200 Calculus of kidney: Secondary | ICD-10-CM

## 2016-05-20 DIAGNOSIS — I2510 Atherosclerotic heart disease of native coronary artery without angina pectoris: Secondary | ICD-10-CM

## 2016-05-20 DIAGNOSIS — E785 Hyperlipidemia, unspecified: Secondary | ICD-10-CM

## 2016-05-20 DIAGNOSIS — I4510 Unspecified right bundle-branch block: Secondary | ICD-10-CM | POA: Diagnosis not present

## 2016-05-20 DIAGNOSIS — I10 Essential (primary) hypertension: Secondary | ICD-10-CM

## 2016-05-20 DIAGNOSIS — N183 Chronic kidney disease, stage 3 unspecified: Secondary | ICD-10-CM

## 2016-05-20 DIAGNOSIS — I2583 Coronary atherosclerosis due to lipid rich plaque: Secondary | ICD-10-CM

## 2016-05-20 NOTE — Patient Instructions (Signed)
Your physician wants you to follow-up in: 6 months or sooner if needed. You will receive a reminder letter in the mail two months in advance. If you don't receive a letter, please call our office to schedule the follow-up appointment.   If you need a refill on your cardiac medications before your next appointment, please call your pharmacy. 

## 2016-05-20 NOTE — Progress Notes (Signed)
Patient ID: Maurice Ross, male   DOB: 29-Dec-1940, 76 y.o.   MRN: [id number]   Primary MD: Dr. Rory Ross  HPI: Maurice Ross, is a 76 y.o. male who presents for an 63 month follow-up cardiology evaluation.  Maurice Ross has established CAD and in July 1997 suffered an anterior wall myocardial infarction and underwent PTCA/stenting of his LAD. Repeat intervention was done in 1998 due to in-stent restenosis. At that time he also had moderate disease in his circumflex and right coronary artery which medical therapy has been recommended. His last nuclear perfusion study was done in August 2013 which showed old scar in the apical septal and apical region. Ejection fraction was 57%. This study was unchanged from 2 years previously.  He has history of type 2 diabetes mellitus  He, his hemoglobin A1c had risen to 12.6 on 12/23/2013.  Over the past year he had seen Dr. Soyla Ross for endocrinologic evaluation.  Lab work by Dr. Nadara Ross on 01/13/2015 still demonstrated increased hemoglobin A1c at 10.6.  She discontinued his Actos, Glucophage, and Glucotrol, and he is now taking Humalog insulin.  He states that his blood sugars are better.  He is also seeing Dr. Kellie Ross who is following his kidney stones.  An echo Doppler study on 01/25/2014 showed an ejection fraction of 45-50% which was not significantly changed from his prior echo in September 2011.  There was grade 1 diastolic dysfunction.  There was evidence for aortic sclerosis.  There was mild pulmonic regurgitation.  He had increased stress  In 2016 with deaths of 3 of his family members including his father age 76, mother age 29, and mother-in-law on Christmas Eve age 11.    Since July 2017, he has had difficulty with kidney stones initially underwent a trial of lithotripsy but ultimately required surgical removal.  He developed allergic reaction to the anesthesia which led to hospitalization for 2 days in October 2017. An echo Doppler study was done during that  evaluation after he was found to have no troponin elevation.  He was found to have mild LVH with an EF of 50-55%.  There was mild hypokinesis of the mid distal anteroseptal wall, grade 1 diastolic dysfunction, mild LA enlargement, mild mitral annular calcification and he had normal pulmonary pressures.  He denies any episodes of chest pain.  He denies shortness of breath.  He denies PND, orthopnea.  He has a history of hyperlipidemia and has been taking Crestor 40 mg for this. He takes lisinopril 10 mg now, carvedilol 3.125 mg twice a day  for hypertension and his CAD.  He also a history of GERD for which he takes Prilosec 20 mg.  He presents for evaluation.   Past Medical History:  Diagnosis Date  . CKD (chronic kidney disease), stage III   . Coronary artery disease CARDIOLOGIST-  DR Maurice Ross   Coronary stent 1997, Last cath 1998, Last Nuc 10/24/2011-no change from previous, Last Echo 2011 EF 45%  . Diabetes mellitus type 2, uncontrolled ([name])    per pt last A1c 8.8 in Aug 2017  . GERD (gastroesophageal reflux disease)   . History of acute anterior wall MI    1997  . History of adenomatous polyp of colon    adenomatous 2004;   tubular adenoma 2013  . History of basal cell carcinoma excision    05-24-2015  left cheek  . History of esophageal dilatation   . Hyperlipidemia    lipomet analysis LDL particle # at 786  .  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 Renal calculus, right   . Renal cyst   . S/P coronary artery stent placement    1997  --  PCI  and stenting to LAD  . Wears glasses   . Wears hearing aid    bilateral    Past Surgical History:  Procedure Laterality Date  . BALLOON DILATION  01/24/2012   Procedure: BALLOON DILATION;  Surgeon: Maurice Houston, MD;  [facility];  Service: Endoscopy;  Laterality: N/A;  . CARDIOVASCULAR STRESS TEST  10-24-2011   dr Maurice Ross   Low risk nuclear study w/ mild to moderate perfustion defect due to  infarct/scar w/  mild perinfarct ischemia in the apical septal and apical regions/  normal LV function and mild hypokinesis in the apical and apical septal (no sign. change compared to previous study()  . CATARACT EXTRACTION W/ INTRAOCULAR LENS  IMPLANT, BILATERAL  2015  . COLONOSCOPY WITH ESOPHAGOGASTRODUODENOSCOPY (EGD)  01/24/2012   Procedure: COLONOSCOPY WITH ESOPHAGOGASTRODUODENOSCOPY (EGD);  Surgeon: Maurice Houston, MD;  [facility];  Service: Endoscopy;  Laterality: N/A;  955  . CORONARY ANGIOPLASTY  06/08/1996   dr Maurice Ross   Balloon angioplasty to  pLAD for in-stent restenosis, no change mLAD 30-50%/  narrowing  midRCA 40-50%/  mild ectasia of LCX w/ 30-50%  proximal and marginal narrowing/  mild LV dysfunction w/ anterolateral hypocontractility  . CORONARY ANGIOPLASTY WITH STENT PLACEMENT  09-15-1995    dr Maurice Ross   PTCA/ Stenting (palmaz-schatz)  to proximal LAD  . CYSTOSCOPY WITH STENT PLACEMENT Right 01/01/2016   Procedure: CYSTOSCOPY WITH STENT PLACEMENT;  Surgeon: Maurice Gallo, MD;  [facility];  Service: Urology;  Laterality: Right;  . CYSTOSCOPY/RETROGRADE/URETEROSCOPY/STONE EXTRACTION WITH BASKET Right 01/01/2016   Procedure: CYSTOSCOPY/RETROGRADE/URETEROSCOPY;  Surgeon: Maurice Gallo, MD;  Location: [facility];  Service: Urology;  Laterality: Right;  . EXTRACORPOREAL SHOCK WAVE LITHOTRIPSY  10-30-2015;   11-25-2011  . HOLMIUM LASER APPLICATION Right 92/01/9416   Procedure: HOLMIUM LASER APPLICATION;  Surgeon: Maurice Gallo, MD;  Location: [facility];  Service: Urology;  Laterality: Right;  . LUMBAR MICRODISCECTOMY  1998   L5 -- S1  . MALONEY DILATION  01/24/2012   Procedure: MALONEY DILATION;  Surgeon: Maurice Houston, MD;  [facility];  Service: Endoscopy;  Laterality: N/A;  . SAVORY DILATION  01/24/2012   Procedure: SAVORY DILATION;  Surgeon: Maurice Houston, MD;  [facility];  Service: Endoscopy;  Laterality: N/A;  . TRANSTHORACIC ECHOCARDIOGRAM  01/25/2014   mild concentric LVH, ef 40-81%, grade 1 diastolic dysfunction/  mild PR    No Known Allergies  Current Outpatient Prescriptions  Medication Sig Dispense Refill  . aspirin 81 MG tablet Take 81 mg by mouth daily.    . carvedilol (COREG) 3.125 MG tablet Take 1 tablet by mouth two  times daily 180 tablet 1  . Insulin Lispro Prot & Lispro (HUMALOG MIX 75/25 KWIKPEN [place]) Inject 40 Units into the skin 2 (two) times daily before a meal. Take 40 units in the morning and 20 units at lunch and 40 units before meals at night    . lisinopril (PRINIVIL,ZESTRIL) 10 MG tablet Take 0.5 tablets by mouth every morning.     . potassium citrate (UROCIT-K) 10 MEQ (1080 MG) SR tablet Take 10 mEq by mouth 2 (two) times daily.    . rosuvastatin (CRESTOR) 40 MG tablet  Take 40 mg by mouth every evening.      No current facility-administered medications for this visit.     Social History   Social History  . Marital status: Married    Spouse name: Blanch Media  . Number of children: 2  . Years of education: N/A   Occupational History  . retired Engineer, manufacturing systems    Social History Main Topics  . Smoking status: Former Smoker    Years: 3.00    Types: Pipe, Cigars    Quit date: 12/27/2011  . Smokeless tobacco: Never Used  . Alcohol use No  . Drug use: No  . Sexual activity: Not on file   Other Topics Concern  . Not on file   Social History Narrative  . No narrative on file    Family History  Problem Relation Age of Onset  . Heart disease Mother 3  . Heart disease Father 95  . Healthy Son   . Healthy Son    Social history is notable that he is married has 2 children. He does not routinely exercise. There is no tobacco or alcohol use.   ROS General: Negative; No fevers, chills, or night sweats;  HEENT: Negative; No changes in vision or hearing, sinus congestion, difficulty swallowing Pulmonary: Negative; No  cough, wheezing, shortness of breath, hemoptysis Cardiovascular: Negative; No chest pain, presyncope, syncope, palpitations GI: positive for GERD; No nausea, vomiting, diarrhea, or abdominal pain GU: Positive for kidney stones; No dysuria, hematuria, or difficulty voiding Musculoskeletal: Negative; no myalgias, joint pain, or weakness Hematologic/Oncology: Negative; no easy bruising, bleeding Endocrine: positive for poorly controlled diabetes mellitus.  No thyroid abnormalities. Neuro: Negative; no changes in balance, headaches Skin: Negative; No rashes or skin lesions Psychiatric: Negative; No behavioral problems, depression Sleep: Negative; No snoring, daytime sleepiness, hypersomnolence, bruxism, restless legs, hypnogognic hallucinations, no cataplexy Other comprehensive 14 point system review is negative.   PE BP 100/64   Pulse (!) 56   Ht 5' 7.5" (1.715 m)   Wt 203 lb (92.1 kg)   BMI 31.33 kg/m    Repeat blood pressure by me was 120/70.  Wt Readings from Last 3 Encounters:  05/20/16 203 lb (92.1 kg)  01/08/16 188 lb (85.3 kg)  01/01/16 198 lb 5 oz (90 kg)   General: Alert, oriented, no distress.  Skin: normal turgor, no rashes HEENT: Normocephalic, atraumatic. Pupils round and reactive; sclera anicteric;no lid lag.  Nose without nasal septal hypertrophy Mouth/Parynx benign; Mallinpatti scale 3 Neck: No JVD, no carotid bruits with normal carotid upstroke Lungs: clear to ausculatation and percussion; no wheezing or rales Chest wall: Nontender to palpation Heart: RRR, s1 s2 normal 1/6 systolic murmur; .  No diastolic murmur.  No S3 gallop.  No rub thrills or heaves Abdomen: Moderately large diastases recti; soft, nontender; no hepatosplenomehaly, BS+; abdominal aorta nontender and not dilated by palpation. Back: No CVA tenderness Pulses 2+ Extremities: no clubbing cyanosis or edema, Homan's sign negative  Neurologic: grossly nonfocal Psychological: Normal affect and  mood  ECG (independently read by me): Sinus Bradycardia 56 bpm, right bundle branch block, left anterior hemiblock, LVH.  January 2017 ECG (independently read by me): Normal sinus rhythm at 60 bpm.  Right bundle-branch block and left anterior hemiblock consistent with bifascicular block.  QTc interval 432 ms.  February 2016 ECG (independently read by me): Sinus bradycardia 56 bpm.  Right bundle-branch block with repolarization changes.  Septal Q wave in V1.  November 2015 ECG (independently read by me): Marked sinus  bradycardia at 44 bpm.  Right bundle branch block.  Probable left anterior fascicular block. Small septal Q waves  October 2014 ECG: Sinus bradycardia with right bundle branch block. Small Q waves in leads V1 and V2 concord with septal infarct that is old. Nonspecific T changes laterally.  LABS:  I  personally reviewed the outpatient blood work done on 01/13/2015 by Dr. Rory Ross.  Of note, glucose was elevated at 285, BUN 25, creatinine 2.0.  GFR 30 to.  Total cholesterol 95, triglycerides 82, HDL 36, LDL 43.  Normal LFTs.  A1c was elevated at 10.6.  Dr. Nadara Ross had checked laboratory November 2017.  I will try to obtain these results for my review.  BMP Latest Ref Rng & Units 01/09/2016 01/08/2016 01/01/2016  Glucose 65 - 99 mg/dL 166(H) 230(H) 151(H)  BUN 6 - 20 mg/dL 40(H) 44(H) 30(H)  Creatinine 0.61 - 1.24 mg/dL 1.91(H) 2.41(H) 1.80(H)  Sodium 135 - 145 mmol/L 132(L) 130(L) 140  Potassium 3.5 - 5.1 mmol/L 3.6 4.0 4.5  Chloride 101 - 111 mmol/L 106 101 105  CO2 22 - 32 mmol/L 19(L) 21(L) -  Calcium 8.9 - 10.3 mg/dL 8.2(L) 8.8(L) -   Hepatic Function Latest Ref Rng & Units 01/09/2016 01/08/2016 09/16/2015  Total Protein 6.5 - 8.1 g/dL 5.6(L) 6.2(L) 6.6  Albumin 3.5 - 5.0 g/dL 2.4(L) 2.8(L) 3.8  AST 15 - 41 U/L 79(H) 79(H) 26  ALT 17 - 63 U/L 123(H) 105(H) 26  Alk Phosphatase 38 - 126 U/L 67 70 61  Total Bilirubin 0.3 - 1.2 mg/dL 0.9 1.0 0.9   CBC Latest Ref Rng &  Units 01/08/2016 01/01/2016 09/16/2015  WBC 4.0 - 10.5 K/uL 10.4 - 9.5  Hemoglobin 13.0 - 17.0 g/dL 15.8 16.3 16.6  Hematocrit 39.0 - 52.0 % 44.8 48.0 47.5  Platelets 150 - 400 K/uL 173 - 123(L)   Lab Results  Component Value Date   MCV 84.2 01/08/2016   MCV 83.3 09/16/2015   MCV 80.8 11/30/2013   Lab Results  Component Value Date   TSH 1.153 11/30/2013   Lab Results  Component Value Date   HGBA1C 12.8 (H) 11/30/2013   Lipid Panel     Component Value Date/Time   CHOL 101 11/30/2013 0847   TRIG 137 11/30/2013 0847   HDL 33 (L) 11/30/2013 0847   CHOLHDL 3.1 11/30/2013 0847   VLDL 27 11/30/2013 0847   LDLCALC 41 11/30/2013 0847     RADIOLOGY: No results found.  IMPRESSION:  1. Coronary artery disease due to lipid rich plaque   2. Essential hypertension   3. Essential hypertension, benign   4. Hyperlipidemia LDL goal <70   5. RBBB   6. Kidney stones   7. CKD (chronic kidney disease) stage 3, GFR 30-59 ml/min     ASSESSMENT AND PLAN: Maurice Ross is a 76 year old Caucasian male who is status post an anterior wall myocardial infarction in 1997 for which he underwent initial intervention to his LAD and required repeat intervention secondary to restenosis in 1998. He has concomitant CAD and has been on medical therapy. His nuclear perfusion study in August 2013 was unchanged from 2011 and only showed a very small region of abnormality in the apical septal region.  Blood pressure today is controlled.  At times he has noted some mild lightheadedness.  There is no orthostatic change.  He currently has been on lisinopril 5 mg which had been reduced by me last year and low-dose carvedilol.  He no  longer is significantly bradycardic on low-dose carvedilol instead of metoprolol.  He tasted take Crestor 40 mg for hyperlipidemia with target LDL less than 70.  I will try to obtain the blood work that Dr. Nadara Ross had done in November 2017.  The past he has had borderline stage 3/4 chronic  kidney disease.  He did have issues last year with significant large kidney stones which ultimately required surgical extraction.  Resolute, he is stable from a cardiovascular standpoint.  He will continue his current medical regimen.  I will see him in one year for reevaluation or sooner if problems intervene.   Time spent: 25 minutes Troy Sine, MD, [facility]  05/22/2016 6:48 PM

## 2016-06-18 DIAGNOSIS — Z85828 Personal history of other malignant neoplasm of skin: Secondary | ICD-10-CM | POA: Diagnosis not present

## 2016-06-18 DIAGNOSIS — L570 Actinic keratosis: Secondary | ICD-10-CM | POA: Diagnosis not present

## 2016-06-18 DIAGNOSIS — D229 Melanocytic nevi, unspecified: Secondary | ICD-10-CM | POA: Diagnosis not present

## 2016-06-21 DIAGNOSIS — E113553 Type 2 diabetes mellitus with stable proliferative diabetic retinopathy, bilateral: Secondary | ICD-10-CM | POA: Diagnosis not present

## 2016-08-07 DIAGNOSIS — N189 Chronic kidney disease, unspecified: Secondary | ICD-10-CM | POA: Diagnosis not present

## 2016-08-07 DIAGNOSIS — E7800 Pure hypercholesterolemia, unspecified: Secondary | ICD-10-CM | POA: Diagnosis not present

## 2016-08-07 DIAGNOSIS — E1165 Type 2 diabetes mellitus with hyperglycemia: Secondary | ICD-10-CM | POA: Diagnosis not present

## 2016-08-07 DIAGNOSIS — I10 Essential (primary) hypertension: Secondary | ICD-10-CM | POA: Diagnosis not present

## 2016-11-25 DIAGNOSIS — H903 Sensorineural hearing loss, bilateral: Secondary | ICD-10-CM | POA: Diagnosis not present

## 2016-12-09 DIAGNOSIS — I10 Essential (primary) hypertension: Secondary | ICD-10-CM | POA: Diagnosis not present

## 2016-12-09 DIAGNOSIS — E7800 Pure hypercholesterolemia, unspecified: Secondary | ICD-10-CM | POA: Diagnosis not present

## 2016-12-09 DIAGNOSIS — N189 Chronic kidney disease, unspecified: Secondary | ICD-10-CM | POA: Diagnosis not present

## 2016-12-09 DIAGNOSIS — E1165 Type 2 diabetes mellitus with hyperglycemia: Secondary | ICD-10-CM | POA: Diagnosis not present

## 2016-12-17 ENCOUNTER — Encounter: Payer: Self-pay | Admitting: Cardiovascular Disease

## 2016-12-17 ENCOUNTER — Ambulatory Visit (INDEPENDENT_AMBULATORY_CARE_PROVIDER_SITE_OTHER): Payer: Medicare Other | Admitting: Cardiovascular Disease

## 2016-12-17 VITALS — BP 104/58 | HR 66 | Ht 68.0 in | Wt 208.6 lb

## 2016-12-17 DIAGNOSIS — E118 Type 2 diabetes mellitus with unspecified complications: Secondary | ICD-10-CM | POA: Diagnosis not present

## 2016-12-17 DIAGNOSIS — I10 Essential (primary) hypertension: Secondary | ICD-10-CM | POA: Diagnosis not present

## 2016-12-17 DIAGNOSIS — I2510 Atherosclerotic heart disease of native coronary artery without angina pectoris: Secondary | ICD-10-CM

## 2016-12-17 DIAGNOSIS — E785 Hyperlipidemia, unspecified: Secondary | ICD-10-CM

## 2016-12-17 DIAGNOSIS — Z794 Long term (current) use of insulin: Secondary | ICD-10-CM

## 2016-12-17 DIAGNOSIS — I2583 Coronary atherosclerosis due to lipid rich plaque: Secondary | ICD-10-CM

## 2016-12-17 DIAGNOSIS — N183 Chronic kidney disease, stage 3 unspecified: Secondary | ICD-10-CM

## 2016-12-17 DIAGNOSIS — I4510 Unspecified right bundle-branch block: Secondary | ICD-10-CM | POA: Diagnosis not present

## 2016-12-17 DIAGNOSIS — E669 Obesity, unspecified: Secondary | ICD-10-CM | POA: Diagnosis not present

## 2016-12-17 NOTE — Patient Instructions (Signed)

## 2016-12-17 NOTE — Progress Notes (Signed)
Patient ID: Maurice Ross, male   DOB: 1940-09-02, 76 y.o.   MRN: [id number]   Primary MD: Dr. Rory Percy  HPI: Maurice Ross, is a 76 y.o. male who presents for an 7 month follow-up cardiology evaluation.  Maurice Ross has established CAD and in July 1997 suffered an anterior wall myocardial infarction and underwent PTCA/stenting of his LAD. Repeat intervention was done in 1998 due to in-stent restenosis. At that time he also had moderate disease in his circumflex and right coronary artery which medical therapy has been recommended. His last nuclear perfusion study was done in August 2013 which showed old scar in the apical septal and apical region. Ejection fraction was 57%. This study was unchanged from 2 years previously.  He has history of type 2 diabetes mellitus  He, his hemoglobin A1c had risen to 12.6 on 12/23/2013.  Over the past year he had seen Dr. Soyla Murphy for endocrinologic evaluation.  Lab work by Dr. Nadara Mustard on 01/13/2015 still demonstrated increased hemoglobin A1c at 10.6.  She discontinued his Actos, Glucophage, and Glucotrol, and he is now taking Humalog insulin.  He states that his blood sugars are better.  He is also seeing Dr. Kellie Simmering who is following his kidney stones.  An echo Doppler study on 01/25/2014 showed an ejection fraction of 45-50% which was not significantly changed from his prior echo in September 2011.  There was grade 1 diastolic dysfunction.  There was evidence for aortic sclerosis.  There was mild pulmonic regurgitation.  He had increased stress  In 2016 with deaths of 3 of his family members including his father age 36, mother age 52, and mother-in-law on Christmas Eve age 72.    Since July 2017, he has had difficulty with kidney stones initially underwent a trial of lithotripsy but ultimately required surgical removal.  He developed allergic reaction to the anesthesia which led to hospitalization for 2 days in October 2017. An echo Doppler study was done during that  evaluation after he was found to have no troponin elevation.  He was found to have mild LVH with an EF of 50-55%.  There was mild hypokinesis of the mid distal anteroseptal wall, grade 1 diastolic dysfunction, mild LA enlargement, mild mitral annular calcification and he had normal pulmonary pressures.  Since I last saw him.  He denies any episodes of recurrent chest pain.  His primary physician, Dr. Rory Percy retired and he will be establishing care with a new primary physician in the next 2 weeks.  He denies shortness of breath, or palpitations.  He remains active and walks 2 miles per day.  He continues to be on carvedilol 3.125 mg twice a day and lisinopril 5 mg daily.  He is tolerating rosuvastatin without myalgias.  He presents for evaluation.  Past Medical History:  Diagnosis Date  . CKD (chronic kidney disease), stage III ([place])   . Coronary artery disease CARDIOLOGIST-  DR Shelva Majestic   Coronary stent 1997, Last cath 1998, Last Nuc 10/24/2011-no change from previous, Last Echo 2011 EF 45%  . Diabetes mellitus type 2, uncontrolled ([place])    per pt last A1c 8.8 in Aug 2017  . GERD (gastroesophageal reflux disease)   . History of acute anterior wall MI    1997  . History of adenomatous polyp of colon    adenomatous 2004;   tubular adenoma 2013  . History of basal cell carcinoma excision    05-24-2015  left cheek  . History of esophageal dilatation   .  Hyperlipidemia    lipomet analysis LDL particle # at 786  . 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 Renal calculus, right   . Renal cyst   . S/P coronary artery stent placement    1997  --  PCI  and stenting to LAD  . Wears glasses   . Wears hearing aid    bilateral    Past Surgical History:  Procedure Laterality Date  . BALLOON DILATION  01/24/2012   Procedure: BALLOON DILATION;  Surgeon: Rogene Houston, MD;  [facility];  Service: Endoscopy;  Laterality: N/A;  . CARDIOVASCULAR  STRESS TEST  10-24-2011   dr Claiborne Billings   Low risk nuclear study w/ mild to moderate perfustion defect due to infarct/scar w/  mild perinfarct ischemia in the apical septal and apical regions/  normal LV function and mild hypokinesis in the apical and apical septal (no sign. change compared to previous study()  . CATARACT EXTRACTION W/ INTRAOCULAR LENS  IMPLANT, BILATERAL  2015  . COLONOSCOPY WITH ESOPHAGOGASTRODUODENOSCOPY (EGD)  01/24/2012   Procedure: COLONOSCOPY WITH ESOPHAGOGASTRODUODENOSCOPY (EGD);  Surgeon: Rogene Houston, MD;  [facility];  Service: Endoscopy;  Laterality: N/A;  955  . CORONARY ANGIOPLASTY  06/08/1996   dr Shelva Majestic   Balloon angioplasty to  pLAD for in-stent restenosis, no change mLAD 30-50%/  narrowing  midRCA 40-50%/  mild ectasia of LCX w/ 30-50%  proximal and marginal narrowing/  mild LV dysfunction w/ anterolateral hypocontractility  . CORONARY ANGIOPLASTY WITH STENT PLACEMENT  09-15-1995    dr Claiborne Billings   PTCA/ Stenting (palmaz-schatz)  to proximal LAD  . CYSTOSCOPY WITH STENT PLACEMENT Right 01/01/2016   Procedure: CYSTOSCOPY WITH STENT PLACEMENT;  Surgeon: Franchot Gallo, MD;  Location: [facility];  Service: Urology;  Laterality: Right;  . CYSTOSCOPY/RETROGRADE/URETEROSCOPY/STONE EXTRACTION WITH BASKET Right 01/01/2016   Procedure: CYSTOSCOPY/RETROGRADE/URETEROSCOPY;  Surgeon: Franchot Gallo, MD;  Location: [facility];  Service: Urology;  Laterality: Right;  . EXTRACORPOREAL SHOCK WAVE LITHOTRIPSY  10-30-2015;   11-25-2011  . HOLMIUM LASER APPLICATION Right 46/65/9935   Procedure: HOLMIUM LASER APPLICATION;  Surgeon: Franchot Gallo, MD;  Location: [facility];  Service: Urology;  Laterality: Right;  . LUMBAR MICRODISCECTOMY  1998   L5 -- S1  . MALONEY DILATION  01/24/2012   Procedure: MALONEY DILATION;  Surgeon: Rogene Houston, MD;  [facility];  Service: Endoscopy;  Laterality: N/A;  .  SAVORY DILATION  01/24/2012   Procedure: SAVORY DILATION;  Surgeon: Rogene Houston, MD;  [facility];  Service: Endoscopy;  Laterality: N/A;  . TRANSTHORACIC ECHOCARDIOGRAM  01/25/2014   mild concentric LVH, ef 70-17%, grade 1 diastolic dysfunction/  mild PR    No Known Allergies  Current Outpatient Prescriptions  Medication Sig Dispense Refill  . aspirin 81 MG tablet Take 81 mg by mouth daily.    . carvedilol (COREG) 3.125 MG tablet Take 1 tablet by mouth two  times daily 180 tablet 1  . Insulin Lispro Prot & Lispro (HUMALOG MIX 75/25 KWIKPEN [place]) Inject 40 Units into the skin 2 (two) times daily before a meal. Take 40 units in the morning and 20 units at lunch and 40 units before meals at night    . lisinopril (PRINIVIL,ZESTRIL) 10 MG tablet Take 0.5 tablets by mouth every morning.     . potassium citrate (UROCIT-K) 10 MEQ (1080 MG) SR tablet Take 10 mEq by mouth  2 (two) times daily.    . rosuvastatin (CRESTOR) 40 MG tablet Take 40 mg by mouth every evening.      No current facility-administered medications for this visit.     Social History   Social History  . Marital status: Married    Spouse name: Blanch Media  . Number of children: 2  . Years of education: N/A   Occupational History  . retired Engineer, manufacturing systems    Social History Main Topics  . Smoking status: Former Smoker    Years: 3.00    Types: Pipe, Cigars    Quit date: 12/27/2011  . Smokeless tobacco: Never Used  . Alcohol use No  . Drug use: No  . Sexual activity: Not on file   Other Topics Concern  . Not on file   Social History Narrative  . No narrative on file    Family History  Problem Relation Age of Onset  . Heart disease Mother 60  . Heart disease Father 95  . Healthy Son   . Healthy Son    Social history is notable that he is married has 2 children. He does not routinely exercise. There is no tobacco or alcohol use.   ROS General: Negative; No fevers, chills, or night sweats;  HEENT:  Negative; No changes in vision or hearing, sinus congestion, difficulty swallowing Pulmonary: Negative; No cough, wheezing, shortness of breath, hemoptysis Cardiovascular: Negative; No chest pain, presyncope, syncope, palpitations GI: positive for GERD; No nausea, vomiting, diarrhea, or abdominal pain GU: Positive for kidney stones; No dysuria, hematuria, or difficulty voiding Musculoskeletal: Negative; no myalgias, joint pain, or weakness Hematologic/Oncology: Negative; no easy bruising, bleeding Endocrine: positive for poorly controlled diabetes mellitus.  No thyroid abnormalities. Neuro: Negative; no changes in balance, headaches Skin: Negative; No rashes or skin lesions Psychiatric: Negative; No behavioral problems, depression Sleep: Negative; No snoring, daytime sleepiness, hypersomnolence, bruxism, restless legs, hypnogognic hallucinations, no cataplexy Other comprehensive 14 point system review is negative.   PE BP (!) 104/58   Pulse 66   Ht '5\' 8"'  (1.727 m)   Wt 208 lb 9.6 oz (94.6 kg)   BMI 31.72 kg/m    Repeat blood pressure by me was 120/70.  Wt Readings from Last 3 Encounters:  12/17/16 208 lb 9.6 oz (94.6 kg)  05/20/16 203 lb (92.1 kg)  01/08/16 188 lb (85.3 kg)   General: Alert, oriented, no distress.  Skin: normal turgor, no rashes, warm and dry HEENT: Normocephalic, atraumatic. Pupils equal round and reactive to light; sclera anicteric; extraocular muscles intact;  Nose without nasal septal hypertrophy Mouth/Parynx benign; Mallinpatti scale 3 Neck: No JVD, no carotid bruits; normal carotid upstroke Lungs: clear to ausculatation and percussion; no wheezing or rales Chest wall: without tenderness to palpitation Heart: PMI not displaced, RRR, s1 s2 normal, 1/6 systolic murmur, no diastolic murmur, no rubs, gallops, thrills, or heaves Abdomen: soft, nontender; no hepatosplenomehaly, BS+; abdominal aorta nontender and not dilated by palpation. Back: no CVA  tenderness Pulses 2+ Musculoskeletal: full range of motion, normal strength, no joint deformities Extremities: no clubbing cyanosis or edema, Homan's sign negative  Neurologic: grossly nonfocal; Cranial nerves grossly wnl Psychologic: Normal mood and affect   ECG (independently read by me): normal sinus rhythm at 66 bpm.  Right bundle-branch block with repolarization changes.  Left anterior hemiblock.  Normal intervals.  March 2018 ECG (independently read by me): Sinus Bradycardia 56 bpm, right bundle branch block, left anterior hemiblock, LVH.  January 2017 ECG (independently read by me):  Normal sinus rhythm at 60 bpm.  Right bundle-branch block and left anterior hemiblock consistent with bifascicular block.  QTc interval 432 ms.  February 2016 ECG (independently read by me): Sinus bradycardia 56 bpm.  Right bundle-branch block with repolarization changes.  Septal Q wave in V1.  November 2015 ECG (independently read by me): Marked sinus bradycardia at 44 bpm.  Right bundle branch block.  Probable left anterior fascicular block. Small septal Q waves  October 2014 ECG: Sinus bradycardia with right bundle branch block. Small Q waves in leads V1 and V2 concord with septal infarct that is old. Nonspecific T changes laterally.  LABS:  Outpatient blood work done on 01/13/2015 by Dr. Rory Percy.  Of note, glucose was elevated at 285, BUN 25, creatinine 2.0.  GFR 30 to.  Total cholesterol 95, triglycerides 82, HDL 36, LDL 43.  Normal LFTs.  A1c was elevated at 10.6.  Dr. Nadara Mustard had checked laboratory November 2017.   BMP Latest Ref Rng & Units 01/09/2016 01/08/2016 01/01/2016  Glucose 65 - 99 mg/dL 166(H) 230(H) 151(H)  BUN 6 - 20 mg/dL 40(H) 44(H) 30(H)  Creatinine 0.61 - 1.24 mg/dL 1.91(H) 2.41(H) 1.80(H)  Sodium 135 - 145 mmol/L 132(L) 130(L) 140  Potassium 3.5 - 5.1 mmol/L 3.6 4.0 4.5  Chloride 101 - 111 mmol/L 106 101 105  CO2 22 - 32 mmol/L 19(L) 21(L) -  Calcium 8.9 - 10.3 mg/dL  8.2(L) 8.8(L) -   Hepatic Function Latest Ref Rng & Units 01/09/2016 01/08/2016 09/16/2015  Total Protein 6.5 - 8.1 g/dL 5.6(L) 6.2(L) 6.6  Albumin 3.5 - 5.0 g/dL 2.4(L) 2.8(L) 3.8  AST 15 - 41 U/L 79(H) 79(H) 26  ALT 17 - 63 U/L 123(H) 105(H) 26  Alk Phosphatase 38 - 126 U/L 67 70 61  Total Bilirubin 0.3 - 1.2 mg/dL 0.9 1.0 0.9   CBC Latest Ref Rng & Units 01/08/2016 01/01/2016 09/16/2015  WBC 4.0 - 10.5 K/uL 10.4 - 9.5  Hemoglobin 13.0 - 17.0 g/dL 15.8 16.3 16.6  Hematocrit 39.0 - 52.0 % 44.8 48.0 47.5  Platelets 150 - 400 K/uL 173 - 123(L)   Lab Results  Component Value Date   MCV 84.2 01/08/2016   MCV 83.3 09/16/2015   MCV 80.8 11/30/2013   Lab Results  Component Value Date   TSH 1.153 11/30/2013   Lab Results  Component Value Date   HGBA1C 12.8 (H) 11/30/2013   Lipid Panel     Component Value Date/Time   CHOL 101 11/30/2013 0847   TRIG 137 11/30/2013 0847   HDL 33 (L) 11/30/2013 0847   CHOLHDL 3.1 11/30/2013 0847   VLDL 27 11/30/2013 0847   LDLCALC 41 11/30/2013 0847     RADIOLOGY: No results found.  IMPRESSION:  1. Coronary artery disease due to lipid rich plaque   2. Essential hypertension   3. Hyperlipidemia LDL goal <70   4. RBBB   5. CKD (chronic kidney disease) stage 3, GFR 30-59 ml/min (HCC)   6. Mild obesity   7. Type 2 diabetes mellitus with complication, with long-term current use of insulin ([facility])     ASSESSMENT AND PLAN: Mr. Avan Gullett is a 76 year old Caucasian male who is status post an anterior wall myocardial infarction in 1997 for which he underwent initial intervention to his LAD and required repeat intervention secondary to restenosis in 1998. He has concomitant CAD and has been on medical therapy. His nuclear perfusion study in August 2013 was unchanged from 2011 and only showed a  very small region of abnormality in the apical septal region. His last echo Doppler study in October 2017 showed an EF of 50-55% with mild LVH and mild  hypokinesis of the mid to distal anteroseptal wall with grade 1 diastolic dysfunction.  There was mild LA enlargement, mild mitral annular calcification, and trivial tricuspid regurgitation with normal pulmonary pressure. His blood pressure today is well controlled on lisinopril at 5 mg and carvedilol 3.125 mg twice a day.  He is maintaining sinus rhythm and has chronic right bundle branch block, which is stable. Blood pressure today is controlled.  He is no longer significantly bradycardic on low-dose carvedilol instead of metoprolol. He continues to tolerate rosuvastatin 40 mg.  He has mild obesity; additional weight loss was recommended. He is diabetic on Humulin insulin.  He continues to be on aspirin for antiplatelet benefit. He has chronic kidney disease. He has not had recent lab work.  Since he will be reestablishing with a new primary care doctor in 2 weeks I will last that his initial laboratory be sent to me for my review.  Target LDL is less than 70 in this patient with established CAD.  Time spent: 25 minutes Troy Sine, MD, [facility]  12/17/2016 6:46 PM

## 2017-01-15 ENCOUNTER — Encounter (INDEPENDENT_AMBULATORY_CARE_PROVIDER_SITE_OTHER): Payer: Self-pay | Admitting: *Deleted

## 2017-01-17 ENCOUNTER — Other Ambulatory Visit (INDEPENDENT_AMBULATORY_CARE_PROVIDER_SITE_OTHER): Payer: Self-pay | Admitting: *Deleted

## 2017-01-17 DIAGNOSIS — Z860100 Personal history of colon polyps, unspecified: Secondary | ICD-10-CM | POA: Insufficient documentation

## 2017-01-20 ENCOUNTER — Other Ambulatory Visit: Payer: Self-pay | Admitting: Urology

## 2017-01-20 ENCOUNTER — Ambulatory Visit (HOSPITAL_COMMUNITY)
Admission: RE | Admit: 2017-01-20 | Discharge: 2017-01-20 | Disposition: A | Discharge status: Home / Self Care | Payer: Medicare Other | Source: Ambulatory Visit | Attending: Urology | Admitting: Urology

## 2017-01-20 DIAGNOSIS — N200 Calculus of kidney: Secondary | ICD-10-CM | POA: Diagnosis not present

## 2017-01-20 DIAGNOSIS — N201 Calculus of ureter: Secondary | ICD-10-CM

## 2017-01-27 DIAGNOSIS — E1165 Type 2 diabetes mellitus with hyperglycemia: Secondary | ICD-10-CM | POA: Diagnosis not present

## 2017-01-27 DIAGNOSIS — K219 Gastro-esophageal reflux disease without esophagitis: Secondary | ICD-10-CM | POA: Diagnosis not present

## 2017-01-27 DIAGNOSIS — E7800 Pure hypercholesterolemia, unspecified: Secondary | ICD-10-CM | POA: Diagnosis not present

## 2017-01-27 DIAGNOSIS — I119 Hypertensive heart disease without heart failure: Secondary | ICD-10-CM | POA: Diagnosis not present

## 2017-01-27 DIAGNOSIS — N189 Chronic kidney disease, unspecified: Secondary | ICD-10-CM | POA: Diagnosis not present

## 2017-01-28 ENCOUNTER — Ambulatory Visit: Payer: Medicare Other | Admitting: Urology

## 2017-01-28 DIAGNOSIS — N200 Calculus of kidney: Secondary | ICD-10-CM | POA: Diagnosis not present

## 2017-01-30 DIAGNOSIS — Z0000 Encounter for general adult medical examination without abnormal findings: Secondary | ICD-10-CM | POA: Diagnosis not present

## 2017-01-30 DIAGNOSIS — Z1389 Encounter for screening for other disorder: Secondary | ICD-10-CM | POA: Diagnosis not present

## 2017-02-05 DIAGNOSIS — J209 Acute bronchitis, unspecified: Secondary | ICD-10-CM | POA: Diagnosis not present

## 2017-02-21 ENCOUNTER — Telehealth (INDEPENDENT_AMBULATORY_CARE_PROVIDER_SITE_OTHER): Payer: Self-pay | Admitting: *Deleted

## 2017-02-21 ENCOUNTER — Encounter (INDEPENDENT_AMBULATORY_CARE_PROVIDER_SITE_OTHER): Payer: Self-pay | Admitting: *Deleted

## 2017-02-21 NOTE — Telephone Encounter (Signed)
Patient needs trilyte 

## 2017-02-26 MED ORDER — PEG 3350-KCL-NA BICARB-NACL 420 G PO SOLR: 4000.0000 mL | Freq: Once | ORAL | 0 refills | Status: AC

## 2017-03-13 ENCOUNTER — Telehealth (INDEPENDENT_AMBULATORY_CARE_PROVIDER_SITE_OTHER): Payer: Self-pay | Admitting: *Deleted

## 2017-03-13 NOTE — Telephone Encounter (Signed)
agree

## 2017-03-13 NOTE — Telephone Encounter (Signed)
Referring MD/PCP: howard   Procedure: tcs  Reason/Indication:  Hx polyps  Has patient had this procedure before?  Yes, 2013  If so, when, by whom and where?    Is there a family history of colon cancer?  no  Who?  What age when diagnosed?    Is patient diabetic?   yes      Does patient have prosthetic heart valve or mechanical valve?  no  Do you have a pacemaker?  no  Has patient ever had endocarditis? no  Has patient had joint replacement within last 12 months?  no  Is patient constipated or take laxatives? no  Does patient have a history of alcohol/drug use?  no  Is patient on Coumadin, Plavix and/or Aspirin? yes  Medications: asa 81 mg daily, humalog 75/25 40 units at breakfast, 20 units at lunch & 40 units at supper, carvedilol 3.125 mg bid, lisinopril 5 mg daily, potassium 10 mg bid, rosuvastatin 40 mg daily, rantidine 150 mg daily  Allergies: nkda  Medication Adjustment per Dr Laural Golden: asa 2 days decrease Humalog to 20 units at supper evening before  Procedure date & time: 04/10/17 at 11:15

## 2017-04-10 ENCOUNTER — Ambulatory Visit (HOSPITAL_COMMUNITY)
Admission: RE | Admit: 2017-04-10 | Discharge: 2017-04-10 | Disposition: A | Discharge status: Home / Self Care | Payer: Medicare Other | Source: Ambulatory Visit | Attending: Internal Medicine | Admitting: Internal Medicine

## 2017-04-10 ENCOUNTER — Encounter (HOSPITAL_COMMUNITY): Payer: Self-pay

## 2017-04-10 ENCOUNTER — Encounter (HOSPITAL_COMMUNITY)
Admission: RE | Disposition: A | Discharge status: Home / Self Care | Payer: Self-pay | Source: Ambulatory Visit | Attending: Internal Medicine

## 2017-04-10 DIAGNOSIS — E785 Hyperlipidemia, unspecified: Secondary | ICD-10-CM | POA: Diagnosis not present

## 2017-04-10 DIAGNOSIS — K644 Residual hemorrhoidal skin tags: Secondary | ICD-10-CM | POA: Insufficient documentation

## 2017-04-10 DIAGNOSIS — Z794 Long term (current) use of insulin: Secondary | ICD-10-CM | POA: Insufficient documentation

## 2017-04-10 DIAGNOSIS — E119 Type 2 diabetes mellitus without complications: Secondary | ICD-10-CM | POA: Diagnosis not present

## 2017-04-10 DIAGNOSIS — Z1211 Encounter for screening for malignant neoplasm of colon: Secondary | ICD-10-CM | POA: Diagnosis not present

## 2017-04-10 DIAGNOSIS — I4510 Unspecified right bundle-branch block: Secondary | ICD-10-CM | POA: Insufficient documentation

## 2017-04-10 DIAGNOSIS — K219 Gastro-esophageal reflux disease without esophagitis: Secondary | ICD-10-CM | POA: Insufficient documentation

## 2017-04-10 DIAGNOSIS — I252 Old myocardial infarction: Secondary | ICD-10-CM | POA: Diagnosis not present

## 2017-04-10 DIAGNOSIS — I2510 Atherosclerotic heart disease of native coronary artery without angina pectoris: Secondary | ICD-10-CM | POA: Insufficient documentation

## 2017-04-10 DIAGNOSIS — K6289 Other specified diseases of anus and rectum: Secondary | ICD-10-CM | POA: Diagnosis not present

## 2017-04-10 DIAGNOSIS — D125 Benign neoplasm of sigmoid colon: Secondary | ICD-10-CM | POA: Diagnosis not present

## 2017-04-10 DIAGNOSIS — Z7982 Long term (current) use of aspirin: Secondary | ICD-10-CM | POA: Insufficient documentation

## 2017-04-10 DIAGNOSIS — Z955 Presence of coronary angioplasty implant and graft: Secondary | ICD-10-CM | POA: Diagnosis not present

## 2017-04-10 DIAGNOSIS — D122 Benign neoplasm of ascending colon: Secondary | ICD-10-CM | POA: Diagnosis not present

## 2017-04-10 DIAGNOSIS — K648 Other hemorrhoids: Secondary | ICD-10-CM | POA: Diagnosis not present

## 2017-04-10 DIAGNOSIS — Z09 Encounter for follow-up examination after completed treatment for conditions other than malignant neoplasm: Secondary | ICD-10-CM | POA: Diagnosis not present

## 2017-04-10 DIAGNOSIS — Z86010 Personal history of colonic polyps: Secondary | ICD-10-CM | POA: Diagnosis not present

## 2017-04-10 DIAGNOSIS — Z79899 Other long term (current) drug therapy: Secondary | ICD-10-CM | POA: Insufficient documentation

## 2017-04-10 DIAGNOSIS — Z85038 Personal history of other malignant neoplasm of large intestine: Secondary | ICD-10-CM | POA: Insufficient documentation

## 2017-04-10 DIAGNOSIS — K5730 Diverticulosis of large intestine without perforation or abscess without bleeding: Secondary | ICD-10-CM | POA: Diagnosis not present

## 2017-04-10 HISTORY — PX: COLONOSCOPY: SHX5424

## 2017-04-10 HISTORY — PX: POLYPECTOMY: SHX5525

## 2017-04-10 LAB — GLUCOSE, CAPILLARY: Glucose-Capillary: 123 mg/dL — ABNORMAL HIGH (ref 65–99)

## 2017-04-10 SURGERY — COLONOSCOPY
Anesthesia: Moderate Sedation

## 2017-04-10 MED ORDER — MEPERIDINE HCL 50 MG/ML IJ SOLN
INTRAMUSCULAR | Status: DC | PRN
  Administered 2017-04-10 (×2): 25 mg via INTRAVENOUS

## 2017-04-10 MED ORDER — MIDAZOLAM HCL 5 MG/5ML IJ SOLN
INTRAMUSCULAR | Status: DC | PRN
Start: 2017-04-10 — End: 2017-04-10
  Administered 2017-04-10 (×2): 2 mg via INTRAVENOUS

## 2017-04-10 MED ORDER — MEPERIDINE HCL 50 MG/ML IJ SOLN
INTRAMUSCULAR | Status: AC
  Filled 2017-04-10: qty 1

## 2017-04-10 MED ORDER — SODIUM CHLORIDE 0.9 % IV SOLN
INTRAVENOUS | Status: DC
  Administered 2017-04-10: 10:00:00 via INTRAVENOUS

## 2017-04-10 MED ORDER — MIDAZOLAM HCL 5 MG/5ML IJ SOLN
INTRAMUSCULAR | Status: AC
  Filled 2017-04-10: qty 10

## 2017-04-10 NOTE — Op Note (Signed)
[facility] Patient Name: Maurice Ross Procedure Date: 04/10/2017 10:50 AM MRN: [id number] Date of Birth: Apr 07, 1940 Attending MD: Hildred Laser , MD CSN: [id number] Age: 77 Admit Type: Outpatient Procedure:                Colonoscopy Indications:              High risk colon cancer surveillance: Personal                            history of colonic polyps Providers:                Hildred Laser, MD, Charlsie Quest. Theda Sers RN, RN, Nelma Rothman, Technician Referring MD:             Rory Percy, MD Medicines:                Meperidine 50 mg IV, Midazolam 4 mg IV Complications:            No immediate complications. Estimated Blood Loss:     Estimated blood loss was minimal. Procedure:                Pre-Anesthesia Assessment:                           - Prior to the procedure, a History and Physical                            was performed, and patient medications and                            allergies were reviewed. The patient's tolerance of                            previous anesthesia was also reviewed. The risks                            and benefits of the procedure and the sedation                            options and risks were discussed with the patient.                            All questions were answered, and informed consent                            was obtained. Prior Anticoagulants: The patient                            last took aspirin 2 days prior to the procedure.                            ASA Grade Assessment: II - A patient with mild  systemic disease. After reviewing the risks and                            benefits, the patient was deemed in satisfactory                            condition to undergo the procedure.                           After obtaining informed consent, the colonoscope                            was passed under direct vision. Throughout the                            procedure,  the patient's blood pressure, pulse, and                            oxygen saturations were monitored continuously. The                            [id number] ([id number]) scope was introduced through                            the anus and advanced to the the cecum, identified                            by appendiceal orifice and ileocecal valve. The                            colonoscopy was performed without difficulty. The                            patient tolerated the procedure well. The quality                            of the bowel preparation was good. The ileocecal                            valve, appendiceal orifice, and rectum were                            photographed. Scope In: 11:13:50 AM Scope Out: 11:36:59 AM Scope Withdrawal Time: 0 hours 15 minutes 53 seconds  Total Procedure Duration: 0 hours 23 minutes 9 seconds  Findings:      The perianal and digital rectal examinations were normal.      Two sessile polyps were found in the sigmoid colon and ascending colon.       The polyps were small in size. These were biopsied with a cold forceps       for histology. The pathology specimen was placed into Bottle Number 1.      Scattered medium-mouthed diverticula were found in the sigmoid colon.      External and internal hemorrhoids were found during retroflexion. The  hemorrhoids were small.      Anal papilla(e) were hypertrophied. Impression:               - Two small polyps in the sigmoid colon and in the                            ascending colon. Biopsied.                           - Diverticulosis in the sigmoid colon.                           - External and internal hemorrhoids.                           - Anal papilla(e) were hypertrophied. Moderate Sedation:      Moderate (conscious) sedation was administered by the endoscopy nurse       and supervised by the endoscopist. The following parameters were       monitored: oxygen saturation, heart rate, blood  pressure, CO2       capnography and response to care. Total physician intraservice time was       28 minutes. Recommendation:           - Patient has a contact number available for                            emergencies. The signs and symptoms of potential                            delayed complications were discussed with the                            patient. Return to normal activities tomorrow.                            Written discharge instructions were provided to the                            patient.                           - High fiber diet and diabetic (ADA) diet today.                           - Continue present medications.                           - No aspirin, ibuprofen, naproxen, or other                            non-steroidal anti-inflammatory drugs for 1 day.                           - Await pathology results.                           -  Repeat colonoscopy is recommended. The                            colonoscopy date will be determined after pathology                            results from today's exam become available for                            review. Procedure Code(s):        --- Professional ---                           [phone], Colonoscopy, flexible; with biopsy, single                            or multiple                           99152, Moderate sedation services provided by the                            same physician or other qualified health care                            professional performing the diagnostic or                            therapeutic service that the sedation supports,                            requiring the presence of an independent trained                            observer to assist in the monitoring of the                            patient's level of consciousness and physiological                            status; initial 15 minutes of intraservice time,                            patient age 46 years or older                            [phone], Moderate sedation services; each additional                            15 minutes intraservice time Diagnosis Code(s):        --- Professional ---                           Z86.010, Personal history of colonic polyps  D12.5, Benign neoplasm of sigmoid colon                           D12.2, Benign neoplasm of ascending colon                           K64.8, Other hemorrhoids                           K62.89, Other specified diseases of anus and rectum                           K57.30, Diverticulosis of large intestine without                            perforation or abscess without bleeding CPT copyright 2016 [employer]. All rights reserved. The codes documented in this report are preliminary and upon coder review may  be revised to meet current compliance requirements. Hildred Laser, MD Hildred Laser, MD 04/10/2017 11:46:34 AM This report has been signed electronically. Number of Addenda: 0

## 2017-04-10 NOTE — H&P (Signed)
Maurice Ross is an 77 y.o. male.   Chief Complaint: Patient is here for colonoscopy. HPI: Patient is 77 year old Caucasian male who has history of colonic adenomas and is here for surveillance colonoscopy.  He denies abdominal pain change in bowel habits or rectal bleeding. Last colonoscopy was in November 2013 with removal of 3 small polyps and they were all tubular adenomas. Family history is negative for CRC. Last aspirin dose was 2 days ago.  Past Medical History:  Diagnosis Date  . CKD (chronic kidney disease), stage III ([place])   . Coronary artery disease CARDIOLOGIST-  DR Shelva Majestic   Coronary stent 1997, Last cath 1998, Last Nuc 10/24/2011-no change from previous, Last Echo 2011 EF 45%  . Diabetes mellitus type 2, uncontrolled ([place])    per pt last A1c 8.8 in Aug 2017  . GERD (gastroesophageal reflux disease)   . History of acute anterior wall MI    1997  . History of adenomatous polyp of colon    adenomatous 2004;   tubular adenoma 2013  . History of basal cell carcinoma excision    05-24-2015  left cheek  . History of esophageal dilatation   . Hyperlipidemia    lipomet analysis LDL particle # at 786  . 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 Renal calculus, right   . Renal cyst   . S/P coronary artery stent placement    1997  --  PCI  and stenting to LAD  . Wears glasses   . Wears hearing aid    bilateral    Past Surgical History:  Procedure Laterality Date  . BALLOON DILATION  01/24/2012   Procedure: BALLOON DILATION;  Surgeon: Rogene Houston, MD;  [facility];  Service: Endoscopy;  Laterality: N/A;  . CARDIOVASCULAR STRESS TEST  10-24-2011   dr Claiborne Billings   Low risk nuclear study w/ mild to moderate perfustion defect due to infarct/scar w/  mild perinfarct ischemia in the apical septal and apical regions/  normal LV function and mild hypokinesis in the apical and apical septal (no sign. change compared to previous study()  .  CATARACT EXTRACTION W/ INTRAOCULAR LENS  IMPLANT, BILATERAL  2015  . COLONOSCOPY WITH ESOPHAGOGASTRODUODENOSCOPY (EGD)  01/24/2012   Procedure: COLONOSCOPY WITH ESOPHAGOGASTRODUODENOSCOPY (EGD);  Surgeon: Rogene Houston, MD;  [facility];  Service: Endoscopy;  Laterality: N/A;  955  . CORONARY ANGIOPLASTY  06/08/1996   dr Shelva Majestic   Balloon angioplasty to  pLAD for in-stent restenosis, no change mLAD 30-50%/  narrowing  midRCA 40-50%/  mild ectasia of LCX w/ 30-50%  proximal and marginal narrowing/  mild LV dysfunction w/ anterolateral hypocontractility  . CORONARY ANGIOPLASTY WITH STENT PLACEMENT  09-15-1995    dr Claiborne Billings   PTCA/ Stenting (palmaz-schatz)  to proximal LAD  . CYSTOSCOPY WITH STENT PLACEMENT Right 01/01/2016   Procedure: CYSTOSCOPY WITH STENT PLACEMENT;  Surgeon: Franchot Gallo, MD;  Location: [facility];  Service: Urology;  Laterality: Right;  . CYSTOSCOPY/RETROGRADE/URETEROSCOPY/STONE EXTRACTION WITH BASKET Right 01/01/2016   Procedure: CYSTOSCOPY/RETROGRADE/URETEROSCOPY;  Surgeon: Franchot Gallo, MD;  Location: [facility];  Service: Urology;  Laterality: Right;  . EXTRACORPOREAL SHOCK WAVE LITHOTRIPSY  10-30-2015;   11-25-2011  . HOLMIUM LASER APPLICATION Right 02/40/9735   Procedure: HOLMIUM LASER APPLICATION;  Surgeon: Franchot Gallo, MD;  Location: [facility];  Service: Urology;  Laterality: Right;  . LUMBAR MICRODISCECTOMY  1998   L5 --  S1  . MALONEY DILATION  01/24/2012   Procedure: Venia Minks DILATION;  Surgeon: Rogene Houston, MD;  [facility];  Service: Endoscopy;  Laterality: N/A;  . SAVORY DILATION  01/24/2012   Procedure: SAVORY DILATION;  Surgeon: Rogene Houston, MD;  [facility];  Service: Endoscopy;  Laterality: N/A;  . TRANSTHORACIC ECHOCARDIOGRAM  01/25/2014   mild concentric LVH, ef 16-10%, grade 1 diastolic dysfunction/  mild PR    Family History  Problem Relation  Age of Onset  . Heart disease Mother 60  . Heart disease Father 95  . Healthy Son   . Healthy Son    Social History:  reports that he quit smoking about 5 years ago. His smoking use included pipe and cigars. He quit after 3.00 years of use. he has never used smokeless tobacco. He reports that he does not drink alcohol or use drugs.  Allergies: No Known Allergies  Medications Prior to Admission  Medication Sig Dispense Refill  . aspirin 81 MG tablet Take 81 mg by mouth daily.    . carvedilol (COREG) 3.125 MG tablet Take 1 tablet by mouth two  times daily (Patient taking differently: Take 3.125 mg by mouth twice daily) 180 tablet 1  . Insulin Lispro Prot & Lispro (HUMALOG MIX 75/25 KWIKPEN [place]) Inject 30-40 Units into the skin See admin instructions. Take 40 units in the morning and 30 units at lunch and 40 units before meals at night    . lisinopril (PRINIVIL,ZESTRIL) 5 MG tablet Take 5 mg by mouth daily.     . potassium citrate (UROCIT-K) 10 MEQ (1080 MG) SR tablet Take 10 mEq by mouth 2 (two) times daily.    . ranitidine (ZANTAC) 150 MG tablet Take 150 mg by mouth daily.    . rosuvastatin (CRESTOR) 40 MG tablet Take 40 mg by mouth every evening.       Results for orders placed or performed during the hospital encounter of 04/10/17 (from the past 48 hour(s))  Glucose, capillary     Status: Abnormal   Collection Time: 04/10/17 10:22 AM  Result Value Ref Range   Glucose-Capillary 123 (H) 65 - 99 mg/dL   No results found.  ROS  Blood pressure 138/78, pulse 88, temperature 98.1 F (36.7 C), temperature source Oral, resp. rate 16, height 5\' 8"  (1.727 m), weight 198 lb (89.8 kg), SpO2 97 %. Physical Exam  Constitutional: He appears well-developed and well-nourished.  HENT:  Mouth/Throat: Oropharynx is clear and moist.  Eyes: Conjunctivae are normal. No scleral icterus.  Neck: No thyromegaly present.  Cardiovascular: Normal rate, regular rhythm and normal heart sounds.  No murmur  heard. Respiratory: Effort normal and breath sounds normal.  GI:  Abdomen is full.  Small umbilical hernia noted.  Abdomen is soft and nontender without organomegaly or masses.  Lymphadenopathy:    He has no cervical adenopathy.     Assessment/Plan History of colonic adenomas. Surveillance colonoscopy.  Hildred Laser, MD 04/10/2017, 11:05 AM

## 2017-04-10 NOTE — Discharge Instructions (Signed)
Resume aspirin on 04/11/2017. Resume other medications as before. High fiber modified carb diet. No driving for 24 hours. Physician will call with biopsy results.   Colonoscopy, Adult, Care After This sheet gives you information about how to care for yourself after your procedure. Your doctor may also give you more specific instructions. If you have problems or questions, call your doctor. Follow these instructions at home: General instructions   For the first 24 hours after the procedure: ? Do not drive or use machinery. ? Do not sign important documents. ? Do not drink alcohol. ? Do your daily activities more slowly than normal. ? Eat foods that are soft and easy to digest. ? Rest often.  Take over-the-counter or prescription medicines only as told by your doctor.  It is up to you to get the results of your procedure. Ask your doctor, or the department performing the procedure, when your results will be ready. To help cramping and bloating:  Try walking around.  Put heat on your belly (abdomen) as told by your doctor. Use a heat source that your doctor recommends, such as a moist heat pack or a heating pad. ? Put a towel between your skin and the heat source. ? Leave the heat on for 20-30 minutes. ? Remove the heat if your skin turns bright red. This is especially important if you cannot feel pain, heat, or cold. You can get burned. Eating and drinking  Drink enough fluid to keep your pee (urine) clear or pale yellow.  Return to your normal diet as told by your doctor. Avoid heavy or fried foods that are hard to digest.  Avoid drinking alcohol for as long as told by your doctor. Contact a doctor if:  You have blood in your poop (stool) 2-3 days after the procedure. Get help right away if:  You have more than a small amount of blood in your poop.  You see large clumps of tissue (blood clots) in your poop.  Your belly is swollen.  You feel sick to your stomach  (nauseous).  You throw up (vomit).  You have a fever.  You have belly pain that gets worse, and medicine does not help your pain. This information is not intended to replace advice given to you by your health care provider. Make sure you discuss any questions you have with your health care provider. Document Released: 04/06/2010 Document Revised: 11/27/2015 Document Reviewed: 11/27/2015 Elsevier Interactive Patient Education  2017 [place].   High-Fiber Diet Fiber, also called dietary fiber, is a type of carbohydrate found in fruits, vegetables, whole grains, and beans. A high-fiber diet can have many health benefits. Your health care provider may recommend a high-fiber diet to help:  Prevent constipation. Fiber can make your bowel movements more regular.  Lower your cholesterol.  Relieve hemorrhoids, uncomplicated diverticulosis, or irritable bowel syndrome.  Prevent overeating as part of a weight-loss plan.  Prevent heart disease, type 2 diabetes, and certain cancers.  What is my plan? The recommended daily intake of fiber includes:  38 grams for men under age 53.  46 grams for men over age 42.  54 grams for women under age 13.  43 grams for women over age 3.  You can get the recommended daily intake of dietary fiber by eating a variety of fruits, vegetables, grains, and beans. Your health care provider may also recommend a fiber supplement if it is not possible to get enough fiber through your diet. What do I need  to know about a high-fiber diet?  Fiber supplements have not been widely studied for their effectiveness, so it is better to get fiber through food sources.  Always check the fiber content on thenutrition facts label of any prepackaged food. Look for foods that contain at least 5 grams of fiber per serving.  Ask your dietitian if you have questions about specific foods that are related to your condition, especially if those foods are not listed in the  following section.  Increase your daily fiber consumption gradually. Increasing your intake of dietary fiber too quickly may cause bloating, cramping, or gas.  Drink plenty of water. Water helps you to digest fiber. What foods can I eat? Grains Whole-grain breads. Multigrain cereal. Oats and oatmeal. Brown rice. Barley. Bulgur wheat. [place]. Bran muffins. Popcorn. Rye wafer crackers. Vegetables Sweet potatoes. Spinach. Kale. Artichokes. Cabbage. Broccoli. Green peas. Carrots. Squash. Fruits Berries. Pears. Apples. Oranges. Avocados. Prunes and raisins. Dried figs. Meats and Other Protein Sources [place], kidney, pinto, and soy beans. Split peas. Lentils. Nuts and seeds. Dairy Fiber-fortified yogurt. Beverages Fiber-fortified soy milk. Fiber-fortified orange juice. Other Fiber bars. The items listed above may not be a complete list of recommended foods or beverages. Contact your dietitian for more options. What foods are not recommended? Grains White bread. Pasta made with refined flour. White rice. Vegetables Fried potatoes. Canned vegetables. Well-cooked vegetables. Fruits Fruit juice. Cooked, strained fruit. Meats and Other Protein Sources Fatty cuts of meat. Fried Sales executive or fried fish. Dairy Milk. Yogurt. Cream cheese. Sour cream. Beverages Soft drinks. Other Cakes and pastries. Butter and oils. The items listed above may not be a complete list of foods and beverages to avoid. Contact your dietitian for more information. What are some tips for including high-fiber foods in my diet?  Eat a wide variety of high-fiber foods.  Make sure that half of all grains consumed each day are whole grains.  Replace breads and cereals made from refined flour or white flour with whole-grain breads and cereals.  Replace white rice with brown rice, bulgur wheat, or millet.  Start the day with a breakfast that is high in fiber, such as a cereal that contains at least 5 grams of fiber per  serving.  Use beans in place of meat in soups, salads, or pasta.  Eat high-fiber snacks, such as berries, raw vegetables, nuts, or popcorn. This information is not intended to replace advice given to you by your health care provider. Make sure you discuss any questions you have with your health care provider. Document Released: 03/04/2005 Document Revised: 08/10/2015 Document Reviewed: 08/17/2013 Elsevier Interactive Patient Education  [employer].

## 2017-04-14 ENCOUNTER — Encounter (HOSPITAL_COMMUNITY): Payer: Self-pay | Admitting: Internal Medicine

## 2017-04-15 DIAGNOSIS — E7800 Pure hypercholesterolemia, unspecified: Secondary | ICD-10-CM | POA: Diagnosis not present

## 2017-04-15 DIAGNOSIS — E1165 Type 2 diabetes mellitus with hyperglycemia: Secondary | ICD-10-CM | POA: Diagnosis not present

## 2017-04-15 DIAGNOSIS — I10 Essential (primary) hypertension: Secondary | ICD-10-CM | POA: Diagnosis not present

## 2017-04-15 DIAGNOSIS — N189 Chronic kidney disease, unspecified: Secondary | ICD-10-CM | POA: Diagnosis not present

## 2017-06-19 ENCOUNTER — Ambulatory Visit: Payer: Medicare Other | Admitting: Cardiovascular Disease

## 2017-06-19 ENCOUNTER — Encounter: Payer: Self-pay | Admitting: Cardiovascular Disease

## 2017-06-19 VITALS — BP 126/64 | HR 56 | Ht 68.0 in | Wt 210.0 lb

## 2017-06-19 DIAGNOSIS — E785 Hyperlipidemia, unspecified: Secondary | ICD-10-CM

## 2017-06-19 DIAGNOSIS — I4510 Unspecified right bundle-branch block: Secondary | ICD-10-CM | POA: Diagnosis not present

## 2017-06-19 DIAGNOSIS — N183 Chronic kidney disease, stage 3 unspecified: Secondary | ICD-10-CM

## 2017-06-19 DIAGNOSIS — E118 Type 2 diabetes mellitus with unspecified complications: Secondary | ICD-10-CM | POA: Diagnosis not present

## 2017-06-19 DIAGNOSIS — E669 Obesity, unspecified: Secondary | ICD-10-CM

## 2017-06-19 DIAGNOSIS — I2510 Atherosclerotic heart disease of native coronary artery without angina pectoris: Secondary | ICD-10-CM | POA: Diagnosis not present

## 2017-06-19 DIAGNOSIS — I10 Essential (primary) hypertension: Secondary | ICD-10-CM

## 2017-06-19 DIAGNOSIS — Z794 Long term (current) use of insulin: Secondary | ICD-10-CM

## 2017-06-19 DIAGNOSIS — I2583 Coronary atherosclerosis due to lipid rich plaque: Secondary | ICD-10-CM | POA: Diagnosis not present

## 2017-06-19 NOTE — Patient Instructions (Signed)

## 2017-06-19 NOTE — Progress Notes (Signed)
Patient ID: Maurice Ross, male   DOB: 08/29/1940, 77 y.o.   MRN: [id number]   Primary MD: Dr. Rory Percy  HPI: Maurice Ross, is a 77 y.o. male who presents for a 6 month follow-up cardiology evaluation.  Maurice Ross has established CAD and in July 1997 suffered an anterior wall myocardial infarction and underwent PTCA/stenting of his LAD. Repeat intervention was done in 1998 due to in-stent restenosis. At that time he also had moderate disease in his circumflex and right coronary artery which medical therapy has been recommended. His last nuclear perfusion study was done in August 2013 which showed old scar in the apical septal and apical region. Ejection fraction was 57%. This study was unchanged from 2 years previously.  He has history of type 2 diabetes mellitus  He, his hemoglobin A1c had risen to 12.6 on 12/23/2013.  Over the past year he had seen Dr. Soyla Murphy for endocrinologic evaluation.  Lab work by Dr. Nadara Mustard on 01/13/2015 still demonstrated increased hemoglobin A1c at 10.6.  She discontinued his Actos, Glucophage, and Glucotrol, and he is now taking Humalog insulin.  He states that his blood sugars are better.  He is also seeing Dr. Kellie Simmering who is following his kidney stones.  An echo Doppler study on 01/25/2014 showed an ejection fraction of 45-50% which was not significantly changed from his prior echo in September 2011.  There was grade 1 diastolic dysfunction.  There was evidence for aortic sclerosis.  There was mild pulmonic regurgitation.  He had increased stress  In 2016 with deaths of 3 of his family members including his father age 60, mother age 33, and mother-in-law on Christmas Eve age 52.    Since July 2017, he has had difficulty with kidney stones initially underwent a trial of lithotripsy but ultimately required surgical removal.  He developed allergic reaction to the anesthesia which led to hospitalization for 2 days in October 2017. An echo Doppler study was done during that  evaluation after he was found to have no troponin elevation.  He was found to have mild LVH with an EF of 50-55%.  There was mild hypokinesis of the mid distal anteroseptal wall, grade 1 diastolic dysfunction, mild LA enlargement, mild mitral annular calcification and he had normal pulmonary pressures.  I last saw him in October 2018.  Denies any anginal symptomatology.  He continues to be on lisinopril 5 mg, carvedilol 3.125 mg twice a day.  He is tolerating rosuvastatin 40 mg without side effects for hyperlipidemia.  He is diabetic on insulin.  [place] 15 his hemoglobin A1c was 12.2. In January 2019 his most recent hemoglobin A1c was 8.3.  He has not had any recurrent kidney stones October 2017.  He pesents for reevaluation.  Past Medical History:  Diagnosis Date  . CKD (chronic kidney disease), stage III ([place])   . Coronary artery disease CARDIOLOGIST-  DR Shelva Majestic   Coronary stent 1997, Last cath 1998, Last Nuc 10/24/2011-no change from previous, Last Echo 2011 EF 45%  . Diabetes mellitus type 2, uncontrolled ([place])    per pt last A1c 8.8 in Aug 2017  . GERD (gastroesophageal reflux disease)   . History of acute anterior wall MI    1997  . History of adenomatous polyp of colon    adenomatous 2004;   tubular adenoma 2013  . History of basal cell carcinoma excision    05-24-2015  left cheek  . History of esophageal dilatation   . Hyperlipidemia  lipomet analysis LDL particle # at 786  . 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 Renal calculus, right   . Renal cyst   . S/P coronary artery stent placement    1997  --  PCI  and stenting to LAD  . Wears glasses   . Wears hearing aid    bilateral    Past Surgical History:  Procedure Laterality Date  . BALLOON DILATION  01/24/2012   Procedure: BALLOON DILATION;  Surgeon: Rogene Houston, MD;  [facility];  Service: Endoscopy;  Laterality: N/A;  . CARDIOVASCULAR STRESS TEST  10-24-2011   dr  Claiborne Billings   Low risk nuclear study w/ mild to moderate perfustion defect due to infarct/scar w/  mild perinfarct ischemia in the apical septal and apical regions/  normal LV function and mild hypokinesis in the apical and apical septal (no sign. change compared to previous study()  . CATARACT EXTRACTION W/ INTRAOCULAR LENS  IMPLANT, BILATERAL  2015  . COLONOSCOPY N/A 04/10/2017   Procedure: COLONOSCOPY;  Surgeon: Rogene Houston, MD;  [facility];  Service: Endoscopy;  Laterality: N/A;  1115  . COLONOSCOPY WITH ESOPHAGOGASTRODUODENOSCOPY (EGD)  01/24/2012   Procedure: COLONOSCOPY WITH ESOPHAGOGASTRODUODENOSCOPY (EGD);  Surgeon: Rogene Houston, MD;  [facility];  Service: Endoscopy;  Laterality: N/A;  955  . CORONARY ANGIOPLASTY  06/08/1996   dr Shelva Majestic   Balloon angioplasty to  pLAD for in-stent restenosis, no change mLAD 30-50%/  narrowing  midRCA 40-50%/  mild ectasia of LCX w/ 30-50%  proximal and marginal narrowing/  mild LV dysfunction w/ anterolateral hypocontractility  . CORONARY ANGIOPLASTY WITH STENT PLACEMENT  09-15-1995    dr Claiborne Billings   PTCA/ Stenting (palmaz-schatz)  to proximal LAD  . CYSTOSCOPY WITH STENT PLACEMENT Right 01/01/2016   Procedure: CYSTOSCOPY WITH STENT PLACEMENT;  Surgeon: Franchot Gallo, MD;  [facility];  Service: Urology;  Laterality: Right;  . CYSTOSCOPY/RETROGRADE/URETEROSCOPY/STONE EXTRACTION WITH BASKET Right 01/01/2016   Procedure: CYSTOSCOPY/RETROGRADE/URETEROSCOPY;  Surgeon: Franchot Gallo, MD;  Location: [facility];  Service: Urology;  Laterality: Right;  . EXTRACORPOREAL SHOCK WAVE LITHOTRIPSY  10-30-2015;   11-25-2011  . HOLMIUM LASER APPLICATION Right 61/60/7371   Procedure: HOLMIUM LASER APPLICATION;  Surgeon: Franchot Gallo, MD;  Location[facility];  Service: Urology;  Laterality: Right;  . LUMBAR MICRODISCECTOMY  1998   L5 -- S1  . MALONEY DILATION  01/24/2012    Procedure: MALONEY DILATION;  Surgeon: Rogene Houston, MD;  [facility];  Service: Endoscopy;  Laterality: N/A;  . POLYPECTOMY  04/10/2017   Procedure: POLYPECTOMY;  Surgeon: Rogene Houston, MD;  [facility];  Service: Endoscopy;;  . SAVORY DILATION  01/24/2012   Procedure: SAVORY DILATION;  Surgeon: Rogene Houston, MD;  [facility];  Service: Endoscopy;  Laterality: N/A;  . TRANSTHORACIC ECHOCARDIOGRAM  01/25/2014   mild concentric LVH, ef 06-26%, grade 1 diastolic dysfunction/  mild PR    No Known Allergies  Current Outpatient Medications  Medication Sig Dispense Refill  . aspirin 81 MG tablet Take 1 tablet (81 mg total) by mouth daily. 30 tablet   . carvedilol (COREG) 3.125 MG tablet Take 1 tablet by mouth two  times daily (Patient taking differently: Take 3.125 mg by mouth twice daily) 180 tablet 1  . Insulin Lispro Prot & Lispro (HUMALOG MIX 75/25 KWIKPEN [place]) Inject 30-40 Units into the  skin See admin instructions. Take 40 units in the morning and 30 units at lunch and 40 units before meals at night    . lisinopril (PRINIVIL,ZESTRIL) 5 MG tablet Take 5 mg by mouth daily.     . potassium citrate (UROCIT-K) 10 MEQ (1080 MG) SR tablet Take 10 mEq by mouth 2 (two) times daily.    . ranitidine (ZANTAC) 150 MG tablet Take 150 mg by mouth daily.    . rosuvastatin (CRESTOR) 40 MG tablet Take 40 mg by mouth every evening.      No current facility-administered medications for this visit.     Social History   Socioeconomic History  . Marital status: Married    Spouse name: Blanch Media  . Number of children: 2  . Years of education: Not on file  . Highest education level: Not on file  Occupational History  . Occupation: retired Engineer, manufacturing systems  Social Needs  . Financial resource strain: Not on file  . Food insecurity:    Worry: Not on file    Inability: Not on file  . Transportation needs:    Medical: Not on file    Non-medical: Not on file  Tobacco  Use  . Smoking status: Former Smoker    Years: 3.00    Types: Pipe, Cigars    Last attempt to quit: 12/27/2011    Years since quitting: 5.4  . Smokeless tobacco: Never Used  Substance and Sexual Activity  . Alcohol use: No    Alcohol/week: 0.0 oz  . Drug use: No  . Sexual activity: Not on file  Lifestyle  . Physical activity:    Days per week: Not on file    Minutes per session: Not on file  . Stress: Not on file  Relationships  . Social connections:    Talks on phone: Not on file    Gets together: Not on file    Attends religious service: Not on file    Active member of club or organization: Not on file    Attends meetings of clubs or organizations: Not on file    Relationship status: Not on file  . Intimate partner violence:    Fear of current or ex partner: Not on file    Emotionally abused: Not on file    Physically abused: Not on file    Forced sexual activity: Not on file  Other Topics Concern  . Not on file  Social History Narrative  . Not on file    Family History  Problem Relation Age of Onset  . Heart disease Mother 69  . Heart disease Father 95  . Healthy Son   . Healthy Son    Social history is notable that he is married has 2 children. He does not routinely exercise. There is no tobacco or alcohol use.   ROS General: Negative; No fevers, chills, or night sweats;  HEENT: Negative; No changes in vision or hearing, sinus congestion, difficulty swallowing Pulmonary: Negative; No cough, wheezing, shortness of breath, hemoptysis Cardiovascular: Negative; No chest pain, presyncope, syncope, palpitations GI: positive for GERD; No nausea, vomiting, diarrhea, or abdominal pain GU: Positive for kidney stones; No dysuria, hematuria, or difficulty voiding Musculoskeletal: Negative; no myalgias, joint pain, or weakness Hematologic/Oncology: Negative; no easy bruising, bleeding Endocrine: positive for poorly controlled diabetes mellitus.  No thyroid  abnormalities. Neuro: Negative; no changes in balance, headaches Skin: Negative; No rashes or skin lesions Psychiatric: Negative; No behavioral problems, depression Sleep: Negative; No snoring, daytime sleepiness, hypersomnolence,  bruxism, restless legs, hypnogognic hallucinations, no cataplexy Other comprehensive 14 point system review is negative.   PE BP 126/64 (BP Location: Left Arm, Patient Position: Sitting, Cuff Size: Normal)   Pulse (!) 56   Ht _0  (1.727 m)   Wt 210 lb (95.3 kg)   BMI 31.93 kg/m    Repeat blood pressure was 124/68  Wt Readings from Last 3 Encounters:  06/19/17 210 lb (95.3 kg)  04/10/17 198 lb (89.8 kg)  12/17/16 208 lb 9.6 oz (94.6 kg)   General: Alert, oriented, no distress.  Skin: normal turgor, no rashes, warm and dry HEENT: Normocephalic, atraumatic. Pupils equal round and reactive to light; sclera anicteric; extraocular muscles intact;  Nose without nasal septal hypertrophy Mouth/Parynx benign; Mallinpatti scale 3 Neck: No JVD, no carotid bruits; normal carotid upstroke Lungs: clear to ausculatation and percussion; no wheezing or rales Chest wall: without tenderness to palpitation Heart: PMI not displaced, RRR, s1 s2 normal, 1/6 systolic murmur, no diastolic murmur, no rubs, gallops, thrills, or heaves Abdomen: soft, nontender; no hepatosplenomehaly, BS+; abdominal aorta nontender and not dilated by palpation. Back: no CVA tenderness Pulses 2+ Musculoskeletal: full range of motion, normal strength, no joint deformities Extremities: no clubbing cyanosis or edema, Homan's sign negative  Neurologic: grossly nonfocal; Cranial nerves grossly wnl Psychologic: Normal mood and affect   ECG (independently read by me): Sinus bradycardia 56 bpm.  Right bundle branch block with repolarization changes.  Left anterior hemiblock.  Normal intervals.  No ectopy.  October 2018 ECG (independently read by me): normal sinus rhythm at 66 bpm.  Right  bundle-branch block with repolarization changes.  Left anterior hemiblock.  Normal intervals.  March 2018 ECG (independently read by me): Sinus Bradycardia 56 bpm, right bundle branch block, left anterior hemiblock, LVH.  January 2017 ECG (independently read by me): Normal sinus rhythm at 60 bpm.  Right bundle-branch block and left anterior hemiblock consistent with bifascicular block.  QTc interval 432 ms.  February 2016 ECG (independently read by me): Sinus bradycardia 56 bpm.  Right bundle-branch block with repolarization changes.  Septal Q wave in V1.  November 2015 ECG (independently read by me): Marked sinus bradycardia at 44 bpm.  Right bundle branch block.  Probable left anterior fascicular block. Small septal Q waves  October 2014 ECG: Sinus bradycardia with right bundle branch block. Small Q waves in leads V1 and V2 concord with septal infarct that is old. Nonspecific T changes laterally.  LABS:  Viewed recent November 2018 laboratory by Dr. Nadara Mustard.  Total cholesterol 118, HDL 36, LDL 61, triglycerides 105.  Globin 17.3.  Creatinine 1.87. HbA1c 8.3  BMP Latest Ref Rng & Units 01/09/2016 01/08/2016 01/01/2016  Glucose 65 - 99 mg/dL 166(H) 230(H) 151(H)  BUN 6 - 20 mg/dL 40(H) 44(H) 30(H)  Creatinine 0.61 - 1.24 mg/dL 1.91(H) 2.41(H) 1.80(H)  Sodium 135 - 145 mmol/L 132(L) 130(L) 140  Potassium 3.5 - 5.1 mmol/L 3.6 4.0 4.5  Chloride 101 - 111 mmol/L 106 101 105  CO2 22 - 32 mmol/L 19(L) 21(L) -  Calcium 8.9 - 10.3 mg/dL 8.2(L) 8.8(L) -   Hepatic Function Latest Ref Rng & Units 01/09/2016 01/08/2016 09/16/2015  Total Protein 6.5 - 8.1 g/dL 5.6(L) 6.2(L) 6.6  Albumin 3.5 - 5.0 g/dL 2.4(L) 2.8(L) 3.8  AST 15 - 41 U/L 79(H) 79(H) 26  ALT 17 - 63 U/L 123(H) 105(H) 26  Alk Phosphatase 38 - 126 U/L 67 70 61  Total Bilirubin 0.3 - 1.2 mg/dL 0.9 1.0  0.9   CBC Latest Ref Rng & Units 01/08/2016 01/01/2016 09/16/2015  WBC 4.0 - 10.5 K/uL 10.4 - 9.5  Hemoglobin 13.0 - 17.0 g/dL 15.8 16.3  16.6  Hematocrit 39.0 - 52.0 % 44.8 48.0 47.5  Platelets 150 - 400 K/uL 173 - 123(L)   Lab Results  Component Value Date   MCV 84.2 01/08/2016   MCV 83.3 09/16/2015   MCV 80.8 11/30/2013   Lab Results  Component Value Date   TSH 1.153 11/30/2013   Lab Results  Component Value Date   HGBA1C 12.8 (H) 11/30/2013   Lipid Panel     Component Value Date/Time   CHOL 101 11/30/2013 0847   TRIG 137 11/30/2013 0847   HDL 33 (L) 11/30/2013 0847   CHOLHDL 3.1 11/30/2013 0847   VLDL 27 11/30/2013 0847   LDLCALC 41 11/30/2013 0847     RADIOLOGY: No results found.  IMPRESSION:  1. Coronary artery disease due to lipid rich plaque   2. Essential hypertension   3. RBBB   4. Hyperlipidemia LDL goal <70   5. Type 2 diabetes mellitus with complication, with long-term current use of insulin ([place])   6. Mild obesity   7. CKD (chronic kidney disease) stage 3, GFR 30-59 ml/min ([facility])     ASSESSMENT AND PLAN: Maurice Ross is a 77 year old Caucasian male who is status post an anterior wall myocardial infarction in 1997 for which he underwent initial intervention to his LAD and required repeat intervention secondary to restenosis in 1998. He has concomitant CAD and has been on medical therapy. His nuclear perfusion study in August 2013 was unchanged from 2011 and only showed a very small region of abnormality in the apical septal region. His last echo Doppler study in October 2017 showed an EF of 50-55% with mild LVH and mild hypokinesis of the mid to distal anteroseptal wall with grade 1 diastolic dysfunction.  There was mild LA enlargement, mild mitral annular calcification, and trivial tricuspid regurgitation with normal pulmonary pressure.  Reference to his CAD, he is not having any recurrent angina.  His blood pressure today is well controlled on his regimen consisting of lisinopril and low-dose carvedilol.  Chronic right bundle branch block.  He continues to be on rosuvastatin 40 mg for  hyperlipidemia.  In November 2018, laboratory done by Dr. Rory Percy showed an LDL cholesterol at 61 with a total cholesterol of 118.  He is on Humalog insulin diabetes mellitus with improved control but in January 2019 hemoglobin A1c was still elevated at 8.3.  He has chronic kidney disease with a creatinine of 1.87 in November 2018.  He has mildly obese with a body mass index of 31.93.  Weight loss and increased exercise was recommended.  I will see him in 1 year for reevaluation.   Time spent: 25 minutes Troy Sine, MD, [facility]  06/21/2017 9:40 AM

## 2017-06-21 ENCOUNTER — Encounter: Payer: Self-pay | Admitting: Cardiovascular Disease

## 2017-07-03 DIAGNOSIS — E113553 Type 2 diabetes mellitus with stable proliferative diabetic retinopathy, bilateral: Secondary | ICD-10-CM | POA: Diagnosis not present

## 2017-08-12 DIAGNOSIS — E7800 Pure hypercholesterolemia, unspecified: Secondary | ICD-10-CM | POA: Diagnosis not present

## 2017-08-12 DIAGNOSIS — E1165 Type 2 diabetes mellitus with hyperglycemia: Secondary | ICD-10-CM | POA: Diagnosis not present

## 2017-08-18 DIAGNOSIS — E1165 Type 2 diabetes mellitus with hyperglycemia: Secondary | ICD-10-CM | POA: Diagnosis not present

## 2017-08-18 DIAGNOSIS — I10 Essential (primary) hypertension: Secondary | ICD-10-CM | POA: Diagnosis not present

## 2017-08-18 DIAGNOSIS — E7800 Pure hypercholesterolemia, unspecified: Secondary | ICD-10-CM | POA: Diagnosis not present

## 2017-08-18 DIAGNOSIS — N189 Chronic kidney disease, unspecified: Secondary | ICD-10-CM | POA: Diagnosis not present

## 2017-12-13 DIAGNOSIS — Z23 Encounter for immunization: Secondary | ICD-10-CM | POA: Diagnosis not present

## 2018-01-09 ENCOUNTER — Other Ambulatory Visit: Payer: Self-pay | Admitting: Physician Assistant

## 2018-01-09 DIAGNOSIS — D485 Neoplasm of uncertain behavior of skin: Secondary | ICD-10-CM | POA: Diagnosis not present

## 2018-01-09 DIAGNOSIS — L570 Actinic keratosis: Secondary | ICD-10-CM | POA: Diagnosis not present

## 2018-01-09 DIAGNOSIS — C44519 Basal cell carcinoma of skin of other part of trunk: Secondary | ICD-10-CM | POA: Diagnosis not present

## 2018-01-09 DIAGNOSIS — B079 Viral wart, unspecified: Secondary | ICD-10-CM | POA: Diagnosis not present

## 2018-01-09 DIAGNOSIS — C44619 Basal cell carcinoma of skin of left upper limb, including shoulder: Secondary | ICD-10-CM | POA: Diagnosis not present

## 2018-01-27 ENCOUNTER — Ambulatory Visit (HOSPITAL_COMMUNITY)
Admission: RE | Admit: 2018-01-27 | Discharge: 2018-01-27 | Disposition: A | Discharge status: Home / Self Care | Payer: Medicare Other | Source: Ambulatory Visit | Attending: Urology | Admitting: Urology

## 2018-01-27 ENCOUNTER — Ambulatory Visit: Payer: Medicare Other | Admitting: Urology

## 2018-01-27 ENCOUNTER — Other Ambulatory Visit: Payer: Self-pay | Admitting: Urology

## 2018-01-27 DIAGNOSIS — N201 Calculus of ureter: Secondary | ICD-10-CM | POA: Diagnosis not present

## 2018-01-27 DIAGNOSIS — Z87442 Personal history of urinary calculi: Secondary | ICD-10-CM | POA: Diagnosis not present

## 2018-01-27 DIAGNOSIS — N200 Calculus of kidney: Secondary | ICD-10-CM

## 2018-02-02 DIAGNOSIS — K219 Gastro-esophageal reflux disease without esophagitis: Secondary | ICD-10-CM | POA: Diagnosis not present

## 2018-02-02 DIAGNOSIS — N189 Chronic kidney disease, unspecified: Secondary | ICD-10-CM | POA: Diagnosis not present

## 2018-02-02 DIAGNOSIS — I119 Hypertensive heart disease without heart failure: Secondary | ICD-10-CM | POA: Diagnosis not present

## 2018-02-02 DIAGNOSIS — E1165 Type 2 diabetes mellitus with hyperglycemia: Secondary | ICD-10-CM | POA: Diagnosis not present

## 2018-02-02 DIAGNOSIS — E7800 Pure hypercholesterolemia, unspecified: Secondary | ICD-10-CM | POA: Diagnosis not present

## 2018-02-06 DIAGNOSIS — Z0000 Encounter for general adult medical examination without abnormal findings: Secondary | ICD-10-CM | POA: Diagnosis not present

## 2018-02-19 DIAGNOSIS — C44519 Basal cell carcinoma of skin of other part of trunk: Secondary | ICD-10-CM | POA: Diagnosis not present

## 2018-04-01 IMAGING — CT CT RENAL STONE PROTOCOL
2 of 4 series · 16 of 46 positions shown, 18 images · non-contrast
Comparison: 11/15/2011

CLINICAL DATA: Right flank pain beginning this afternoon. History
of kidney stones. Vomiting.

EXAM:
CT ABDOMEN AND PELVIS WITHOUT CONTRAST
TECHNIQUE: Multidetector CT imaging of the abdomen and pelvis was performed
following the standard protocol without IV contrast.

[Series 2: routine abd pel with · axial · 0.89mm/px · z∈[-544,-90]mm · 13 of 101 slices shown, 15 images]
[im 5/101  soft-tissue]
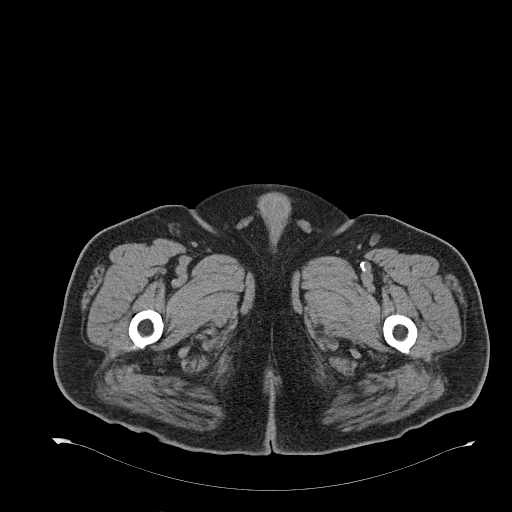
[im 5/101  bone]
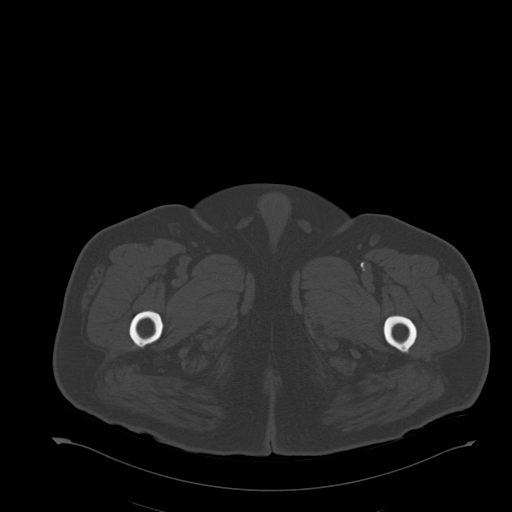
[im 13/101  soft-tissue]
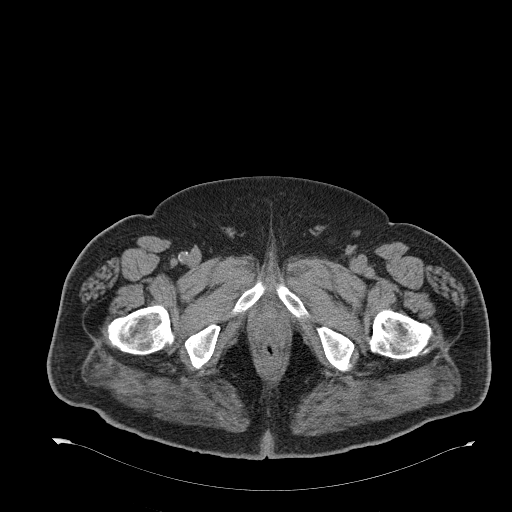
[im 21/101  soft-tissue]
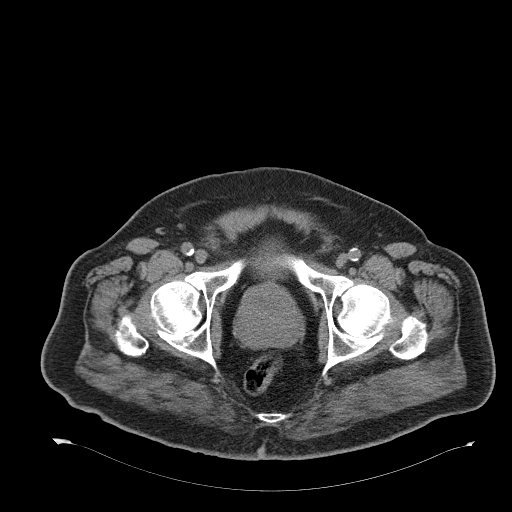
[im 30/101  soft-tissue]
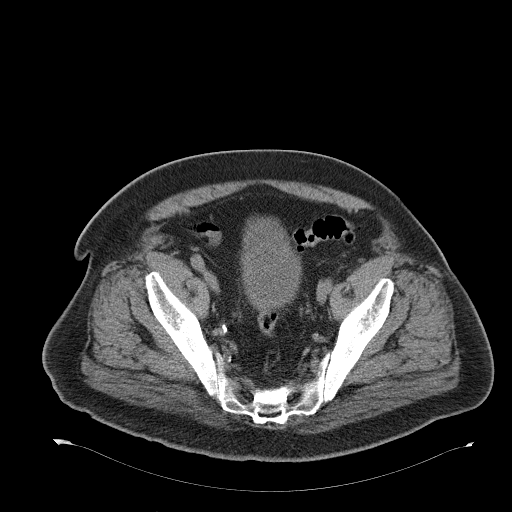
[im 34/101  soft-tissue]
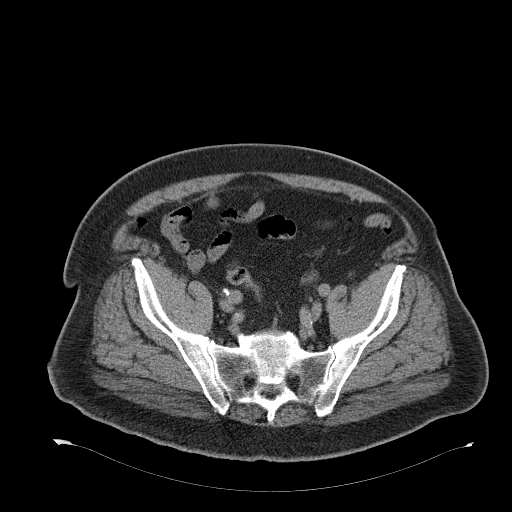
[im 42/101  soft-tissue]
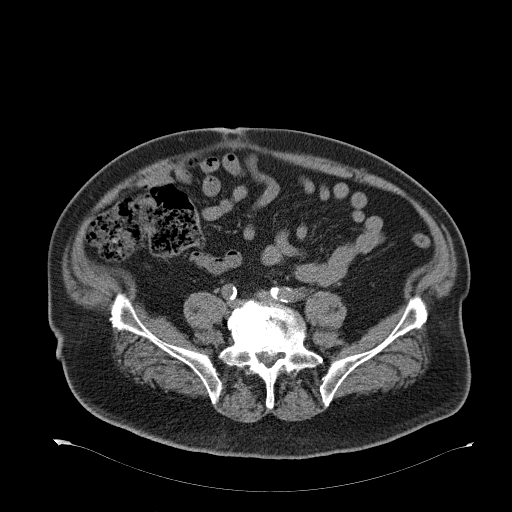
[im 51/101  soft-tissue]
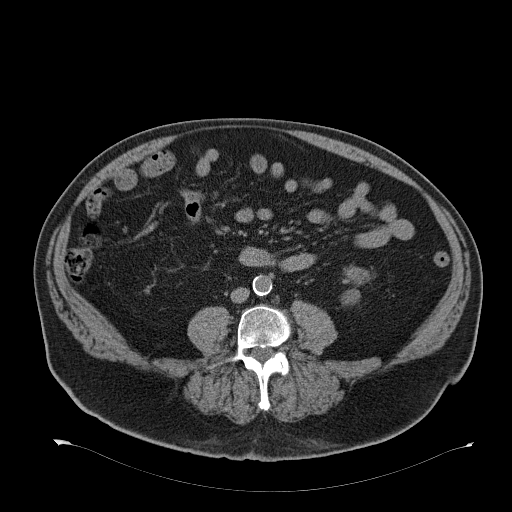
[im 59/101  soft-tissue]
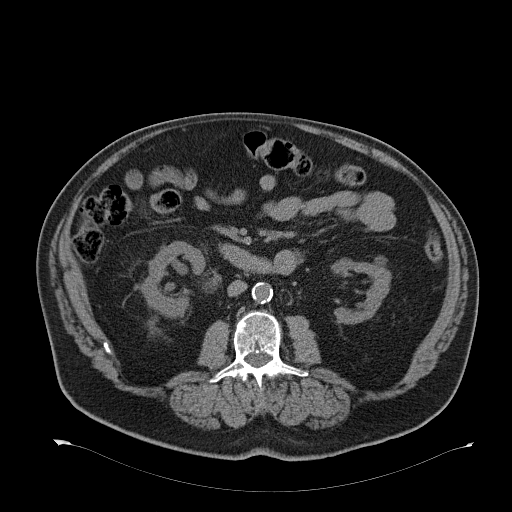
[im 67/101  soft-tissue]
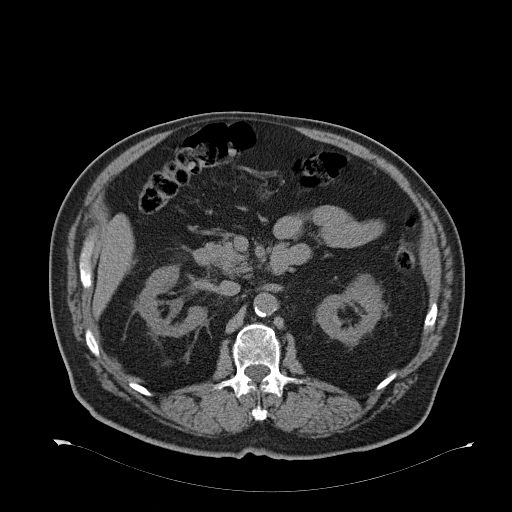
[im 67/101  bone]
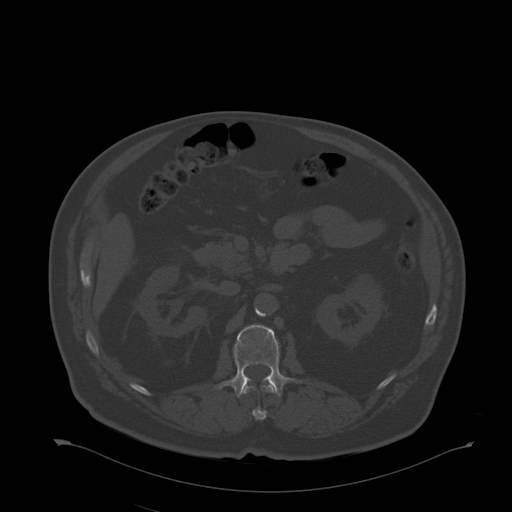
[im 71/101  soft-tissue]
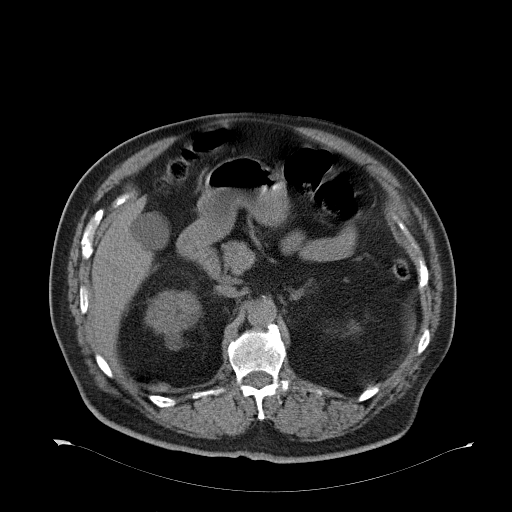
[im 80/101  soft-tissue]
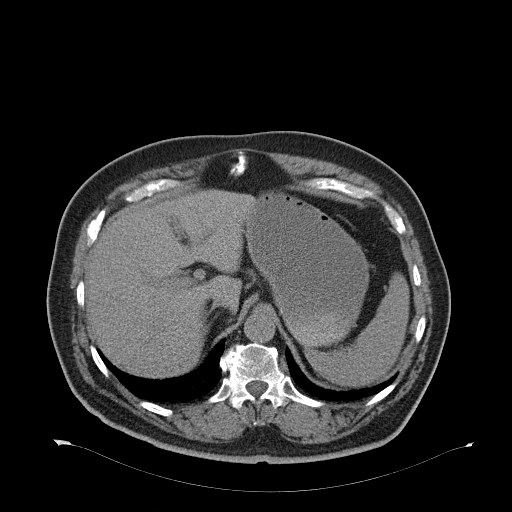
[im 88/101  soft-tissue]
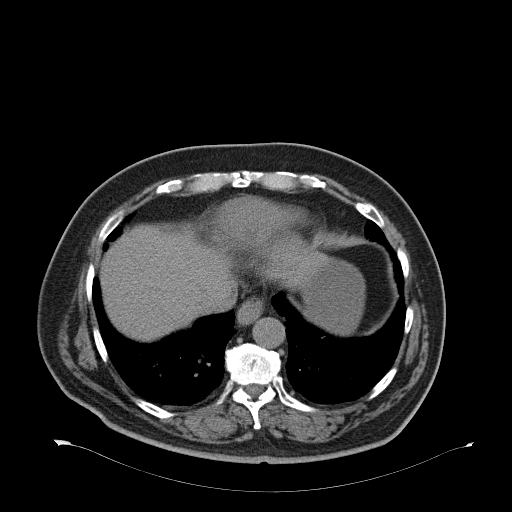
[im 96/101  soft-tissue]
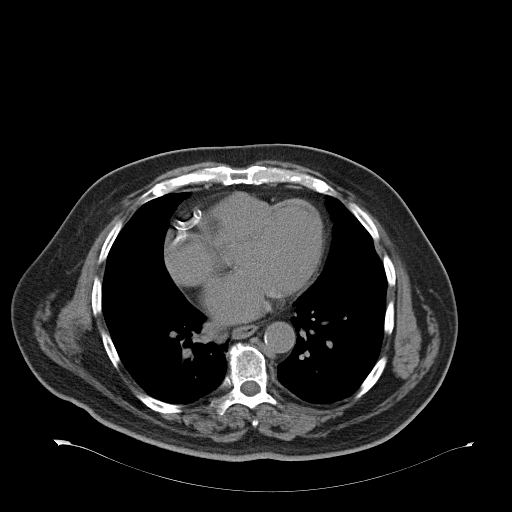

[Series 3: coronal · coronal · 0.91mm/px · 3 of 143 slices shown]
[im 48/143  soft-tissue]
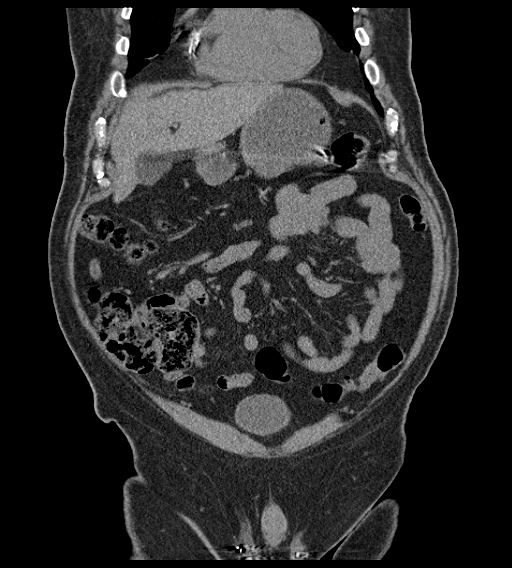
[im 64/143  soft-tissue]
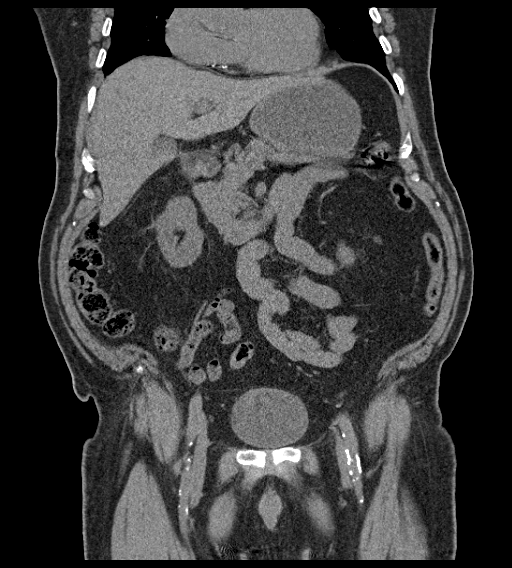
[im 79/143  soft-tissue]
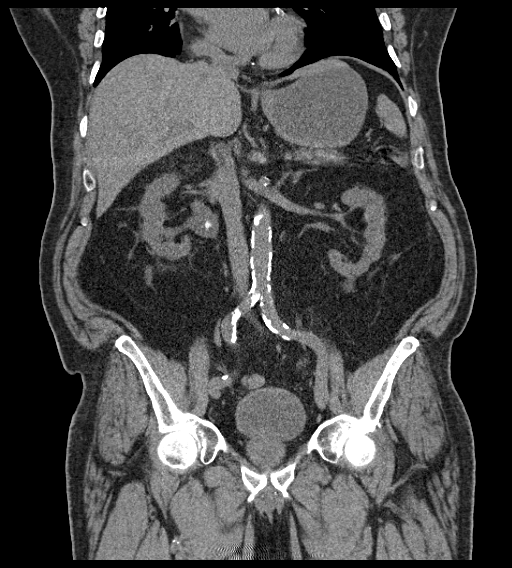

[16 of 46 positions shown; findings below may reference images not displayed]

FINDINGS: Lower chest: Lung bases are within normal. There is moderate
calcified plaque over the left anterior descending and right
coronary arteries.

Hepatobiliary: No mass visualized on this un-enhanced exam.

Pancreas: No mass or inflammatory process identified on this
un-enhanced exam.

Spleen: Within normal limits in size.

Adrenals/Urinary Tract: Adrenal glands are normal. Kidneys are
normal in size. There are several small bilateral renal cysts. No
left renal stones. 1.2 cm stone over the right renal pelvis with
mild dilatation of the right intrarenal collecting system. Ureters
are otherwise within normal. Bladder is normal.

Stomach/Bowel: No evidence of obstruction, inflammatory process, or
abnormal fluid collections. Appendix is normal. There is mild
diverticulosis of the colon.

Vascular/Lymphatic: No pathologically enlarged lymph nodes. No
evidence of abdominal aortic aneurysm. Calcified plaque over the
abdominal aorta and iliac arteries.

Reproductive: Mild prominence of the prostate gland.

Other: Mesentery is within normal without inflammatory change or
free fluid.

Musculoskeletal:  Degenerative change of the spine and hips.
IMPRESSION: 1.2 cm stone over the right renal pelvis causing low-grade
obstruction.

Atherosclerotic coronary artery disease.  Aortic atherosclerosis.

Mild diverticulosis of the colon.

Bilateral renal cysts.

## 2018-07-13 ENCOUNTER — Ambulatory Visit: Payer: Medicare Other | Admitting: Cardiovascular Disease

## 2018-08-04 DIAGNOSIS — L570 Actinic keratosis: Secondary | ICD-10-CM | POA: Diagnosis not present

## 2018-08-04 DIAGNOSIS — L2081 Atopic neurodermatitis: Secondary | ICD-10-CM | POA: Diagnosis not present

## 2018-08-17 DIAGNOSIS — E7800 Pure hypercholesterolemia, unspecified: Secondary | ICD-10-CM | POA: Diagnosis not present

## 2018-08-17 DIAGNOSIS — E1165 Type 2 diabetes mellitus with hyperglycemia: Secondary | ICD-10-CM | POA: Diagnosis not present

## 2018-08-17 DIAGNOSIS — N189 Chronic kidney disease, unspecified: Secondary | ICD-10-CM | POA: Diagnosis not present

## 2018-08-17 DIAGNOSIS — I10 Essential (primary) hypertension: Secondary | ICD-10-CM | POA: Diagnosis not present

## 2018-09-10 DIAGNOSIS — E113553 Type 2 diabetes mellitus with stable proliferative diabetic retinopathy, bilateral: Secondary | ICD-10-CM | POA: Diagnosis not present

## 2018-09-14 DIAGNOSIS — R197 Diarrhea, unspecified: Secondary | ICD-10-CM | POA: Diagnosis not present

## 2018-09-18 DIAGNOSIS — R197 Diarrhea, unspecified: Secondary | ICD-10-CM | POA: Diagnosis not present

## 2018-09-24 ENCOUNTER — Other Ambulatory Visit: Payer: Self-pay

## 2018-09-24 ENCOUNTER — Ambulatory Visit (INDEPENDENT_AMBULATORY_CARE_PROVIDER_SITE_OTHER): Payer: Medicare Other | Admitting: Internal Medicine

## 2018-09-24 ENCOUNTER — Encounter (INDEPENDENT_AMBULATORY_CARE_PROVIDER_SITE_OTHER): Payer: Self-pay | Admitting: Internal Medicine

## 2018-09-24 VITALS — BP 129/74 | HR 73 | Temp 98.0°F | Ht 68.0 in | Wt 206.7 lb

## 2018-09-24 DIAGNOSIS — R197 Diarrhea, unspecified: Secondary | ICD-10-CM | POA: Insufficient documentation

## 2018-09-24 NOTE — Patient Instructions (Signed)
GI pathogen. Imodium twice a day.

## 2018-09-24 NOTE — Progress Notes (Signed)
   Subjective:    Patient ID: Maurice Ross, male    DOB: 12/11/1940, 78 y.o.   MRN: [id number] PCP Rory Percy HPI Presents today with c/o diarrhea. About 2 months he had a bout with diarrhea for 2 weeks started.Took Imodium and Pepto Bismol and states he cleared for about 4 weeks. Diarrhea came right back. He says now, he can take the Imodium and Pepto and it does not help. Having 4 BMs a day. He says his stomach growls. Has not been on a recent antibiotic. Stools are very loose. Saw his family Dr for this. His blood was normal. Stool specimen was normal. He is not having any abdominal pain. No fever. Diarrhea now for 4 weeks. No solid stool in weeks.  Hx of CAD an has stent.   Last colonoscopy in January of 2019 for hx of polyps. Impression:               - Two small polyps in the sigmoid colon and in the                            ascending colon. Biopsied.                           - Diverticulosis in the sigmoid colon.                           - External and internal hemorrhoids.                           - Anal papilla(e) were hypertrophied. 2 polyps removed. One tubular adenoma. 5 yr recall.     Review of Systems     Objective:   Physical Exam Blood pressure 129/74, pulse 73, temperature 98 F (36.7 C), height 5\' 8"  (1.727 m), weight 206 lb 11.2 oz (93.8 kg). Alert and oriented. Skin warm and dry. Oral mucosa is moist.   . Sclera anicteric, conjunctivae is pink. Thyroid not enlarged. No cervical lymphadenopathy. Lungs clear. Heart regular rate and rhythm.  Abdomen is soft. Bowel sounds are positive. No hepatomegaly. No abdominal masses felt. No tenderness.  No edema to lower extremities.          Assessment & Plan:  Diarrhea. No recent antibiotics. Am going to get a GI pathogen. Continue the Imodium. Further recommendations to follow.

## 2018-09-25 DIAGNOSIS — R197 Diarrhea, unspecified: Secondary | ICD-10-CM | POA: Diagnosis not present

## 2018-09-28 LAB — GASTROINTESTINAL PATHOGEN PANEL PCR
C. difficile Tox A/B, PCR: NOT DETECTED
Campylobacter, PCR: NOT DETECTED
Cryptosporidium, PCR: NOT DETECTED
E coli (ETEC) LT/ST PCR: NOT DETECTED
E coli (STEC) stx1/stx2, PCR: NOT DETECTED
E coli 0157, PCR: NOT DETECTED
Giardia lamblia, PCR: NOT DETECTED
Norovirus, PCR: NOT DETECTED
Rotavirus A, PCR: NOT DETECTED
Salmonella, PCR: NOT DETECTED
Shigella, PCR: NOT DETECTED

## 2018-10-12 DIAGNOSIS — H903 Sensorineural hearing loss, bilateral: Secondary | ICD-10-CM | POA: Diagnosis not present

## 2018-10-16 DIAGNOSIS — I10 Essential (primary) hypertension: Secondary | ICD-10-CM | POA: Diagnosis not present

## 2018-10-16 DIAGNOSIS — E785 Hyperlipidemia, unspecified: Secondary | ICD-10-CM | POA: Diagnosis not present

## 2018-11-12 ENCOUNTER — Encounter: Payer: Self-pay | Admitting: Cardiovascular Disease

## 2018-11-12 ENCOUNTER — Ambulatory Visit (INDEPENDENT_AMBULATORY_CARE_PROVIDER_SITE_OTHER): Payer: Medicare Other | Admitting: Cardiovascular Disease

## 2018-11-12 ENCOUNTER — Other Ambulatory Visit: Payer: Self-pay

## 2018-11-12 VITALS — BP 139/77 | HR 54 | Ht 67.0 in | Wt 203.0 lb

## 2018-11-12 DIAGNOSIS — E785 Hyperlipidemia, unspecified: Secondary | ICD-10-CM | POA: Diagnosis not present

## 2018-11-12 DIAGNOSIS — I2510 Atherosclerotic heart disease of native coronary artery without angina pectoris: Secondary | ICD-10-CM | POA: Diagnosis not present

## 2018-11-12 DIAGNOSIS — E118 Type 2 diabetes mellitus with unspecified complications: Secondary | ICD-10-CM

## 2018-11-12 DIAGNOSIS — N183 Chronic kidney disease, stage 3 unspecified: Secondary | ICD-10-CM

## 2018-11-12 DIAGNOSIS — I10 Essential (primary) hypertension: Secondary | ICD-10-CM

## 2018-11-12 DIAGNOSIS — Z794 Long term (current) use of insulin: Secondary | ICD-10-CM

## 2018-11-12 DIAGNOSIS — I2583 Coronary atherosclerosis due to lipid rich plaque: Secondary | ICD-10-CM

## 2018-11-12 DIAGNOSIS — I4510 Unspecified right bundle-branch block: Secondary | ICD-10-CM | POA: Diagnosis not present

## 2018-11-12 NOTE — Progress Notes (Signed)
Patient ID: Maurice Ross, male   DOB: 1940-07-01, 78 y.o.   MRN: [id number]   Primary MD: Dr. Rory Ross  HPI: Maurice Ross, is a 78 y.o. male who presents for a 16 month follow-up cardiology evaluation.  Maurice Ross has established CAD and in July 1997 suffered an anterior wall myocardial infarction and underwent PTCA/stenting of his LAD. Repeat intervention was done in 1998 due to in-stent restenosis. At that time he also had moderate disease in his circumflex and right coronary artery which medical therapy has been recommended. His last nuclear perfusion study was done in August 2013 which showed old scar in the apical septal and apical region. Ejection fraction was 57%. This study was unchanged from 2 years previously.  He has history of type 2 diabetes mellitus  He, his hemoglobin A1c had risen to 12.6 on 12/23/2013.  Over the past year he had seen Dr. Soyla Ross for endocrinologic evaluation.  Lab work by Dr. Nadara Ross on 01/13/2015 still demonstrated increased hemoglobin A1c at 10.6.  She discontinued his Actos, Glucophage, and Glucotrol, and he is now taking Humalog insulin.  He states that his blood sugars are better.  He is also seeing Dr. Kellie Ross who is following his kidney stones.  An echo Doppler study on 01/25/2014 showed an ejection fraction of 45-50% which was not significantly changed from his prior echo in September 2011.  There was grade 1 diastolic dysfunction.  There was evidence for aortic sclerosis.  There was mild pulmonic regurgitation.  He had increased stress  In 2016 with deaths of 3 of his family members including his father age 83, mother age 48, and mother-in-law on Christmas Eve age 41.    Since July 2017, he has had difficulty with kidney stones initially underwent a trial of lithotripsy but ultimately required surgical removal.  He developed allergic reaction to the anesthesia which led to hospitalization for 2 days in October 2017. An echo Doppler study was done during that  evaluation after he was found to have no troponin elevation.  He was found to have mild LVH with an EF of 50-55%.  There was mild hypokinesis of the mid distal anteroseptal wall, grade 1 diastolic dysfunction, mild LA enlargement, mild mitral annular calcification and he had normal pulmonary pressures.  I saw him in October 2018 and last saw him in April 2019. He denied any anginal symptomatology.  He was on lisinopril 5 mg, carvedilol 3.125 mg twice a day.  He was tolerating rosuvastatin 40 mg without side effects for hyperlipidemia.  He is diabetic on insulin. His hemoglobin A1c was 12.2 and in January 2019 his most recent hemoglobin A1c was 8.3.  He has not had any recurrent kidney stones October 2017.  During his last evaluation his blood pressure was well controlled.  Since I last saw him his primary MD, Dr. Nadara Ross has retired and is now seeing Maurice Ross.  Also establish care with Dr. Michiel Ross who follows him endocrinologically with reference to his diabetes mellitus.  In November 2019 LDL cholesterol was 50 total cholesterol 102.  Post recent hemoglobin A1c on August 17, 2018 remained elevated at 8.7.  He has chronic kidney disease laboratory on September 14, 2018 showed a creatinine of 1.94.  He remains active and does yard work.  He denies chest pain PND orthopnea or awareness of palpitations.  He presents for follow-up evaluation.  Past Medical History:  Diagnosis Date  . CKD (chronic kidney disease), stage III ([place])   .  Coronary artery disease CARDIOLOGIST-  DR Maurice Ross   Coronary stent 1997, Last cath 1998, Last Nuc 10/24/2011-no change from previous, Last Echo 2011 EF 45%  . Diabetes mellitus type 2, uncontrolled ([place])    per pt last A1c 8.8 in Aug 2017  . GERD (gastroesophageal reflux disease)   . History of acute anterior wall MI    1997  . History of adenomatous polyp of colon    adenomatous 2004;   tubular adenoma 2013  . History of basal cell carcinoma excision    05-24-2015  left  cheek  . History of esophageal dilatation   . Hyperlipidemia    lipomet analysis LDL particle # at 786  . 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 Renal calculus, right   . Renal cyst   . S/P coronary artery stent placement    1997  --  PCI  and stenting to LAD  . Wears glasses   . Wears hearing aid    bilateral    Past Surgical History:  Procedure Laterality Date  . BALLOON DILATION  01/24/2012   Procedure: BALLOON DILATION;  Surgeon: Maurice Houston, MD;  [facility];  Service: Endoscopy;  Laterality: N/A;  . CARDIOVASCULAR STRESS TEST  10-24-2011   dr Claiborne Ross   Low risk nuclear study w/ mild to moderate perfustion defect due to infarct/scar w/  mild perinfarct ischemia in the apical septal and apical regions/  normal LV function and mild hypokinesis in the apical and apical septal (no sign. change compared to previous study()  . CATARACT EXTRACTION W/ INTRAOCULAR LENS  IMPLANT, BILATERAL  2015  . COLONOSCOPY N/A 04/10/2017   Procedure: COLONOSCOPY;  Surgeon: Maurice Houston, MD;  [facility];  Service: Endoscopy;  Laterality: N/A;  1115  . COLONOSCOPY WITH ESOPHAGOGASTRODUODENOSCOPY (EGD)  01/24/2012   Procedure: COLONOSCOPY WITH ESOPHAGOGASTRODUODENOSCOPY (EGD);  Surgeon: Maurice Houston, MD;  [facility];  Service: Endoscopy;  Laterality: N/A;  955  . CORONARY ANGIOPLASTY  06/08/1996   dr Maurice Ross   Balloon angioplasty to  pLAD for in-stent restenosis, no change mLAD 30-50%/  narrowing  midRCA 40-50%/  mild ectasia of LCX w/ 30-50%  proximal and marginal narrowing/  mild LV dysfunction w/ anterolateral hypocontractility  . CORONARY ANGIOPLASTY WITH STENT PLACEMENT  09-15-1995    dr Claiborne Ross   PTCA/ Stenting (palmaz-schatz)  to proximal LAD  . CYSTOSCOPY WITH STENT PLACEMENT Right 01/01/2016   Procedure: CYSTOSCOPY WITH STENT PLACEMENT;  Surgeon: Maurice Gallo, MD;  Location: [facility];  Service:  Urology;  Laterality: Right;  . CYSTOSCOPY/RETROGRADE/URETEROSCOPY/STONE EXTRACTION WITH BASKET Right 01/01/2016   Procedure: CYSTOSCOPY/RETROGRADE/URETEROSCOPY;  Surgeon: Maurice Gallo, MD;  Location[facility];  Service: Urology;  Laterality: Right;  . EXTRACORPOREAL SHOCK WAVE LITHOTRIPSY  10-30-2015;   11-25-2011  . HOLMIUM LASER APPLICATION Right [id number]/4627   Procedure: HOLMIUM LASER APPLICATION;  Surgeon: Maurice Gallo, MD;  Location: [facility];  Service: Urology;  Laterality: Right;  . LUMBAR MICRODISCECTOMY  1998   L5 -- S1  . MALONEY DILATION  01/24/2012   Procedure: MALONEY DILATION;  Surgeon: Maurice Houston, MD;  [facility];  Service: Endoscopy;  Laterality: N/A;  . POLYPECTOMY  04/10/2017   Procedure: POLYPECTOMY;  Surgeon: Maurice Houston, MD;  [facility];  Service: Endoscopy;;  . SAVORY DILATION  01/24/2012   Procedure: SAVORY DILATION;  Surgeon: Maurice Houston,  MD;  [facility];  Service: Endoscopy;  Laterality: N/A;  . TRANSTHORACIC ECHOCARDIOGRAM  01/25/2014   mild concentric LVH, ef 81-82%, grade 1 diastolic dysfunction/  mild PR    No Known Allergies  Current Outpatient Medications  Medication Sig Dispense Refill  . aspirin 81 MG tablet Take 1 tablet (81 mg total) by mouth daily. 30 tablet   . carvedilol (COREG) 3.125 MG tablet Take 1 tablet by mouth two  times daily (Patient taking differently: Take 3.125 mg by mouth twice daily) 180 tablet 1  . Insulin Lispro Prot & Lispro (HUMALOG MIX 75/25 KWIKPEN [place]) Inject 30-40 Units into the skin See admin instructions. Take 40 units in the morning and 30 units at lunch and 40 units before meals at night    . lisinopril (PRINIVIL,ZESTRIL) 5 MG tablet Take 5 mg by mouth daily.     . potassium citrate (UROCIT-K) 10 MEQ (1080 MG) SR tablet Take 10 mEq by mouth 2 (two) times daily.    . ranitidine (ZANTAC) 150 MG tablet Take 150 mg by mouth daily.    .  rosuvastatin (CRESTOR) 40 MG tablet Take 40 mg by mouth every evening.      No current facility-administered medications for this visit.     Social History   Socioeconomic History  . Marital status: Married    Spouse name: Blanch Media  . Number of children: 2  . Years of education: Not on file  . Highest education level: Not on file  Occupational History  . Occupation: retired Engineer, manufacturing systems  Social Needs  . Financial resource strain: Not on file  . Food insecurity    Worry: Not on file    Inability: Not on file  . Transportation needs    Medical: Not on file    Non-medical: Not on file  Tobacco Use  . Smoking status: Former Smoker    Years: 3.00    Types: Pipe, Cigars    Quit date: 12/27/2011    Years since quitting: 6.8  . Smokeless tobacco: Never Used  Substance and Sexual Activity  . Alcohol use: No    Alcohol/week: 0.0 standard drinks  . Drug use: No  . Sexual activity: Not on file  Lifestyle  . Physical activity    Days per week: Not on file    Minutes per session: Not on file  . Stress: Not on file  Relationships  . Social Herbalist on phone: Not on file    Gets together: Not on file    Attends religious service: Not on file    Active member of club or organization: Not on file    Attends meetings of clubs or organizations: Not on file    Relationship status: Not on file  . Intimate partner violence    Fear of current or ex partner: Not on file    Emotionally abused: Not on file    Physically abused: Not on file    Forced sexual activity: Not on file  Other Topics Concern  . Not on file  Social History Narrative  . Not on file    Family History  Problem Relation Age of Onset  . Heart disease Mother 10  . Heart disease Father 95  . Healthy Son   . Healthy Son    Social history is notable that he is married has 2 children. He does not routinely exercise. There is no tobacco or alcohol use.   ROS General: Negative; No  fevers, chills, or  night sweats;  HEENT: Negative; No changes in vision or hearing, sinus congestion, difficulty swallowing Pulmonary: Negative; No cough, wheezing, shortness of breath, hemoptysis Cardiovascular: Negative; No chest pain, presyncope, syncope, palpitations GI: positive for GERD; No nausea, vomiting, diarrhea, or abdominal pain GU: Positive for kidney stones; No dysuria, hematuria, or difficulty voiding Musculoskeletal: Negative; no myalgias, joint pain, or weakness Hematologic/Oncology: Negative; no easy bruising, bleeding Endocrine: positive for poorly controlled diabetes mellitus.  No thyroid abnormalities. Neuro: Negative; no changes in balance, headaches Skin: Negative; No rashes or skin lesions Psychiatric: Negative; No behavioral problems, depression Sleep: Negative; No snoring, daytime sleepiness, hypersomnolence, bruxism, restless legs, hypnogognic hallucinations, no cataplexy Other comprehensive 14 point system review is negative.   PE BP 139/77   Pulse (!) 54   Ht 5' 7" (1.702 m)   Wt 203 lb (92.1 kg)   SpO2 97%   BMI 31.79 kg/m    Repeat blood pressure by me was 124/78  Wt Readings from Last 3 Encounters:  11/12/18 203 lb (92.1 kg)  09/24/18 206 lb 11.2 oz (93.8 kg)  06/19/17 210 lb (95.3 kg)   General: Alert, oriented, no distress.  Skin: normal turgor, no rashes, warm and dry HEENT: Normocephalic, atraumatic. Pupils equal round and reactive to light; sclera anicteric; extraocular muscles intact; Nose without nasal septal hypertrophy Mouth/Parynx benign; Mallinpatti scale 3 Neck: No JVD, no carotid bruits; normal carotid upstroke Lungs: clear to ausculatation and percussion; no wheezing or rales Chest wall: without tenderness to palpitation Heart: PMI not displaced, RRR, s1 s2 normal, 1/6 systolic murmur, no diastolic murmur, no rubs, gallops, thrills, or heaves Abdomen: Moderate diastases recti soft, nontender; no hepatosplenomehaly, BS+; abdominal aorta nontender  and not dilated by palpation. Back: no CVA tenderness Pulses 2+ Musculoskeletal: full range of motion, normal strength, no joint deformities Extremities: no clubbing cyanosis or edema, Homan's sign negative  Neurologic: grossly nonfocal; Cranial nerves grossly wnl Psychologic: Normal mood and affect    ECG (independently read by me): Sinus bradycardia 54 bpm with mild sinus arrhythmia, right bundle branch block with repolarization changes, left anterior hemiblock.  April 2019 ECG (independently read by me): Sinus bradycardia 56 bpm.  Right bundle branch block with repolarization changes.  Left anterior hemiblock.  Normal intervals.  No ectopy.  October 2018 ECG (independently read by me): normal sinus rhythm at 66 bpm.  Right bundle-branch block with repolarization changes.  Left anterior hemiblock.  Normal intervals.  March 2018 ECG (independently read by me): Sinus Bradycardia 56 bpm, right bundle branch block, left anterior hemiblock, LVH.  January 2017 ECG (independently read by me): Normal sinus rhythm at 60 bpm.  Right bundle-branch block and left anterior hemiblock consistent with bifascicular block.  QTc interval 432 ms.  February 2016 ECG (independently read by me): Sinus bradycardia 56 bpm.  Right bundle-branch block with repolarization changes.  Septal Q wave in V1.  November 2015 ECG (independently read by me): Marked sinus bradycardia at 44 bpm.  Right bundle branch block.  Probable left anterior fascicular block. Small septal Q waves  October 2014 ECG: Sinus bradycardia with right bundle branch block. Small Q waves in leads V1 and V2 concord with septal infarct that is old. Nonspecific T changes laterally.  LABS:  Viewed recent November 2018 laboratory by Dr. Nadara Ross.  Total cholesterol 118, HDL 36, LDL 61, triglycerides 105.  Globin 17.3.  Creatinine 1.87. HbA1c 8.3  BMP Latest Ref Rng & Units 01/09/2016 01/08/2016 01/01/2016  Glucose 65 - 99  mg/dL 166(H) 230(H) 151(H)   BUN 6 - 20 mg/dL 40(H) 44(H) 30(H)  Creatinine 0.61 - 1.24 mg/dL 1.91(H) 2.41(H) 1.80(H)  Sodium 135 - 145 mmol/L 132(L) 130(L) 140  Potassium 3.5 - 5.1 mmol/L 3.6 4.0 4.5  Chloride 101 - 111 mmol/L 106 101 105  CO2 22 - 32 mmol/L 19(L) 21(L) -  Calcium 8.9 - 10.3 mg/dL 8.2(L) 8.8(L) -   Hepatic Function Latest Ref Rng & Units 01/09/2016 01/08/2016 09/16/2015  Total Protein 6.5 - 8.1 g/dL 5.6(L) 6.2(L) 6.6  Albumin 3.5 - 5.0 g/dL 2.4(L) 2.8(L) 3.8  AST 15 - 41 U/L 79(H) 79(H) 26  ALT 17 - 63 U/L 123(H) 105(H) 26  Alk Phosphatase 38 - 126 U/L 67 70 61  Total Bilirubin 0.3 - 1.2 mg/dL 0.9 1.0 0.9   CBC Latest Ref Rng & Units 01/08/2016 01/01/2016 09/16/2015  WBC 4.0 - 10.5 K/uL 10.4 - 9.5  Hemoglobin 13.0 - 17.0 g/dL 15.8 16.3 16.6  Hematocrit 39.0 - 52.0 % 44.8 48.0 47.5  Platelets 150 - 400 K/uL 173 - 123(L)   Lab Results  Component Value Date   MCV 84.2 01/08/2016   MCV 83.3 09/16/2015   MCV 80.8 11/30/2013   Lab Results  Component Value Date   TSH 1.153 11/30/2013   Lab Results  Component Value Date   HGBA1C 12.8 (H) 11/30/2013   Lipid Panel     Component Value Date/Time   CHOL 101 11/30/2013 0847   TRIG 137 11/30/2013 0847   HDL 33 (L) 11/30/2013 0847   CHOLHDL 3.1 11/30/2013 0847   VLDL 27 11/30/2013 0847   LDLCALC 41 11/30/2013 0847     RADIOLOGY: No results found.  IMPRESSION:  1. Essential hypertension   2. Coronary artery disease due to lipid rich plaque   3. Hyperlipidemia LDL goal <70   4. RBBB   5. Type 2 diabetes mellitus with complication, with long-term current use of insulin ([place])   6. CKD (chronic kidney disease) stage 3, GFR 30-59 ml/min ([facility])     ASSESSMENT AND PLAN: Mr. Siddhartha Hoback is a 78 year old Caucasian male who suffered an anterior wall myocardial infarction in 1997 for which he underwent initial intervention to his LAD and required repeat intervention secondary to restenosis in 1998. He has concomitant CAD and has been on medical  therapy. His nuclear perfusion study in August 2013 was unchanged from 2011 and only showed a very small region of abnormality in the apical septal region. His last echo Doppler study in October 2017 showed an EF of 50-55% with mild LVH and mild hypokinesis of the mid to distal anteroseptal wall with grade 1 diastolic dysfunction.  There was mild LA enlargement, mild mitral annular calcification, and trivial tricuspid regurgitation with normal pulmonary pressure.  Presently, he continues to be asymptomatic with reference to his CAD and denies any anginal symptomatology or exertional dyspnea.  He is on rosuvastatin 40 mg daily for hyperlipidemia and most recent LDL cholesterol was excellent at 50.  His ECG today is stable and reveals sinus bradycardia with mild sinus arrhythmia with bifascicular block (right bundle branch block and left anterior hemiblock, unchanged.  He is now seeing Dr. Michiel Ross for his diabetes mellitus.  In the past his hemoglobin A1c had been in excess of 12.  He states the best that it ever gotten down to was 7.8.  Most recent hemoglobin A1c was 8.7.  He has chronic kidney disease and this is relatively stable with most recent creatinine improved from  previously but remains elevated at 1.94.  We will continue with his current medical regimen.  He has lost 7 pounds since his last evaluation.  I encouraged continued weight loss and exercise.  As long as he remains stable I will see him in 1 year for reevaluation or sooner if problems arise.    Time spent: 25 minutes Troy Sine, MD, [facility]  11/12/2018 8:21 AM

## 2018-11-12 NOTE — Patient Instructions (Signed)
Follow-Up: You will need a follow up appointment in 12 months.  Please call our office 2 months in advance, June 2021 to schedule this appointment.  You may see Shelva Majestic, MD or one of the following Advanced Practice Providers on your designated Care Team: Almyra Deforest, PA-C Fabian Sharp, [place]       Medication Instructions:  The current medical regimen is effective;  continue present plan and medications as directed. Please refer to the Current Medication list given to you today. If you need a refill on your cardiac medications before your next appointment, please call your pharmacy. Labwork: When you have labs (blood work) and your tests are completely normal, you will receive your results ONLY by [place] (if you have MyChart) -OR- A paper copy in the mail.  At [facility], you and your health needs are our priority.  As part of our continuing mission to provide you with exceptional heart care, we have created designated Provider Care Teams.  These Care Teams include your primary Cardiologist (physician) and Advanced Practice Providers (APPs -  Physician Assistants and Nurse Practitioners) who all work together to provide you with the care you need, when you need it.  Thank you for choosing [facility] at [facility]!!

## 2018-11-16 DIAGNOSIS — E1165 Type 2 diabetes mellitus with hyperglycemia: Secondary | ICD-10-CM | POA: Diagnosis not present

## 2018-11-16 DIAGNOSIS — E7800 Pure hypercholesterolemia, unspecified: Secondary | ICD-10-CM | POA: Diagnosis not present

## 2018-11-16 DIAGNOSIS — I119 Hypertensive heart disease without heart failure: Secondary | ICD-10-CM | POA: Diagnosis not present

## 2018-12-22 ENCOUNTER — Other Ambulatory Visit: Payer: Self-pay

## 2018-12-22 DIAGNOSIS — Z20822 Contact with and (suspected) exposure to covid-19: Secondary | ICD-10-CM

## 2018-12-24 LAB — NOVEL CORONAVIRUS, NAA: SARS-CoV-2, NAA: NOT DETECTED

## 2019-01-15 DIAGNOSIS — E7800 Pure hypercholesterolemia, unspecified: Secondary | ICD-10-CM | POA: Diagnosis not present

## 2019-01-15 DIAGNOSIS — E1165 Type 2 diabetes mellitus with hyperglycemia: Secondary | ICD-10-CM | POA: Diagnosis not present

## 2019-01-27 ENCOUNTER — Other Ambulatory Visit: Payer: Self-pay

## 2019-01-27 NOTE — Patient Outreach (Signed)
[facility]) Care Management  01/27/2019  Maurice Ross 1940-03-22 [id number]   Medication Adherence call to Maurice Ross [place] Identifiers Verify spoke with patient he is past due on Lisinopril 10 mg,patient explain he is now taking 1/2 tablet daily not 1 tablet daily,patient explain he has plenty at this time. Maurice Ross is showing past due under [name].   [facility] Management Direct Dial [phone]  Fax [phone] [email or web address]

## 2019-01-29 ENCOUNTER — Other Ambulatory Visit: Payer: Self-pay

## 2019-01-29 DIAGNOSIS — Z20822 Contact with and (suspected) exposure to covid-19: Secondary | ICD-10-CM

## 2019-02-01 LAB — NOVEL CORONAVIRUS, NAA: SARS-CoV-2, NAA: NOT DETECTED

## 2019-02-12 DIAGNOSIS — E1165 Type 2 diabetes mellitus with hyperglycemia: Secondary | ICD-10-CM | POA: Diagnosis not present

## 2019-02-12 DIAGNOSIS — N189 Chronic kidney disease, unspecified: Secondary | ICD-10-CM | POA: Diagnosis not present

## 2019-02-12 DIAGNOSIS — I119 Hypertensive heart disease without heart failure: Secondary | ICD-10-CM | POA: Diagnosis not present

## 2019-02-12 DIAGNOSIS — E7800 Pure hypercholesterolemia, unspecified: Secondary | ICD-10-CM | POA: Diagnosis not present

## 2019-02-12 DIAGNOSIS — K219 Gastro-esophageal reflux disease without esophagitis: Secondary | ICD-10-CM | POA: Diagnosis not present

## 2019-02-15 DIAGNOSIS — N189 Chronic kidney disease, unspecified: Secondary | ICD-10-CM | POA: Diagnosis not present

## 2019-02-15 DIAGNOSIS — Z0000 Encounter for general adult medical examination without abnormal findings: Secondary | ICD-10-CM | POA: Diagnosis not present

## 2019-02-15 DIAGNOSIS — E1165 Type 2 diabetes mellitus with hyperglycemia: Secondary | ICD-10-CM | POA: Diagnosis not present

## 2019-02-15 DIAGNOSIS — I119 Hypertensive heart disease without heart failure: Secondary | ICD-10-CM | POA: Diagnosis not present

## 2019-02-15 DIAGNOSIS — E7800 Pure hypercholesterolemia, unspecified: Secondary | ICD-10-CM | POA: Diagnosis not present

## 2019-02-16 DIAGNOSIS — Z0000 Encounter for general adult medical examination without abnormal findings: Secondary | ICD-10-CM | POA: Diagnosis not present

## 2019-03-08 DIAGNOSIS — N189 Chronic kidney disease, unspecified: Secondary | ICD-10-CM | POA: Diagnosis not present

## 2019-03-08 DIAGNOSIS — E7800 Pure hypercholesterolemia, unspecified: Secondary | ICD-10-CM | POA: Diagnosis not present

## 2019-03-08 DIAGNOSIS — E1165 Type 2 diabetes mellitus with hyperglycemia: Secondary | ICD-10-CM | POA: Diagnosis not present

## 2019-03-08 DIAGNOSIS — I10 Essential (primary) hypertension: Secondary | ICD-10-CM | POA: Diagnosis not present

## 2019-03-18 DIAGNOSIS — E1165 Type 2 diabetes mellitus with hyperglycemia: Secondary | ICD-10-CM | POA: Diagnosis not present

## 2019-03-18 DIAGNOSIS — N189 Chronic kidney disease, unspecified: Secondary | ICD-10-CM | POA: Diagnosis not present

## 2019-03-27 ENCOUNTER — Ambulatory Visit: Payer: Medicare Other | Attending: Internal Medicine

## 2019-03-27 DIAGNOSIS — Z23 Encounter for immunization: Secondary | ICD-10-CM

## 2019-03-27 NOTE — Progress Notes (Signed)
   Covid-19 Vaccination Clinic  Name:  JANIEL RUNKEL    MRN: [id number] DOB: 1941/02/04  03/27/2019  Mr. Kemna was observed post Covid-19 immunization for 15 minutes without incidence. He was provided with Vaccine Information Sheet and instruction to access the V-Safe system.   Mr. Shaban was instructed to call 911 with any severe reactions post vaccine: Marland Kitchen Difficulty breathing  . Swelling of your face and throat  . A fast heartbeat  . A bad rash all over your body  . Dizziness and weakness    Immunizations Administered    Name Date Dose VIS Date Route   Pfizer COVID-19 Vaccine 03/27/2019  2:09 PM 0.3 mL 02/26/2019 Intramuscular   Manufacturer: [employer], [employer]   Lot: [id number]   [place]: [id number]

## 2019-04-17 ENCOUNTER — Ambulatory Visit: Payer: Medicare Other | Attending: Internal Medicine

## 2019-04-17 ENCOUNTER — Ambulatory Visit: Payer: Medicare Other

## 2019-04-17 DIAGNOSIS — Z23 Encounter for immunization: Secondary | ICD-10-CM | POA: Insufficient documentation

## 2019-04-17 NOTE — Progress Notes (Signed)
   Covid-19 Vaccination Clinic  Name:  Maurice Ross    MRN: [id number] DOB: May 05, 1940  04/17/2019  Mr. Maurice Ross was observed post Covid-19 immunization for 15 minutes without incidence. He was provided with Vaccine Information Sheet and instruction to access the V-Safe system.   Mr. Maurice Ross was instructed to call 911 with any severe reactions post vaccine: Marland Kitchen Difficulty breathing  . Swelling of your face and throat  . A fast heartbeat  . A bad rash all over your body  . Dizziness and weakness    Immunizations Administered    Name Date Dose VIS Date Route   Pfizer COVID-19 Vaccine 04/17/2019  1:00 PM 0.3 mL 02/26/2019 Intramuscular   Manufacturer: [place]   Lot: [id number]   [place]: [id number]

## 2019-05-14 DIAGNOSIS — I10 Essential (primary) hypertension: Secondary | ICD-10-CM | POA: Diagnosis not present

## 2019-05-14 DIAGNOSIS — E7849 Other hyperlipidemia: Secondary | ICD-10-CM | POA: Diagnosis not present

## 2019-06-16 DIAGNOSIS — I10 Essential (primary) hypertension: Secondary | ICD-10-CM | POA: Diagnosis not present

## 2019-06-16 DIAGNOSIS — E7849 Other hyperlipidemia: Secondary | ICD-10-CM | POA: Diagnosis not present

## 2019-07-06 DIAGNOSIS — E1165 Type 2 diabetes mellitus with hyperglycemia: Secondary | ICD-10-CM | POA: Diagnosis not present

## 2019-07-06 DIAGNOSIS — N189 Chronic kidney disease, unspecified: Secondary | ICD-10-CM | POA: Diagnosis not present

## 2019-07-06 DIAGNOSIS — E7800 Pure hypercholesterolemia, unspecified: Secondary | ICD-10-CM | POA: Diagnosis not present

## 2019-07-06 DIAGNOSIS — I10 Essential (primary) hypertension: Secondary | ICD-10-CM | POA: Diagnosis not present

## 2019-07-16 DIAGNOSIS — E1165 Type 2 diabetes mellitus with hyperglycemia: Secondary | ICD-10-CM | POA: Diagnosis not present

## 2019-07-16 DIAGNOSIS — K219 Gastro-esophageal reflux disease without esophagitis: Secondary | ICD-10-CM | POA: Diagnosis not present

## 2019-07-16 DIAGNOSIS — N189 Chronic kidney disease, unspecified: Secondary | ICD-10-CM | POA: Diagnosis not present

## 2019-07-22 DIAGNOSIS — H903 Sensorineural hearing loss, bilateral: Secondary | ICD-10-CM | POA: Diagnosis not present

## 2019-07-23 DIAGNOSIS — H905 Unspecified sensorineural hearing loss: Secondary | ICD-10-CM | POA: Diagnosis not present

## 2019-09-15 DIAGNOSIS — K219 Gastro-esophageal reflux disease without esophagitis: Secondary | ICD-10-CM | POA: Diagnosis not present

## 2019-09-15 DIAGNOSIS — E1165 Type 2 diabetes mellitus with hyperglycemia: Secondary | ICD-10-CM | POA: Diagnosis not present

## 2019-09-15 DIAGNOSIS — N1832 Chronic kidney disease, stage 3b: Secondary | ICD-10-CM | POA: Diagnosis not present

## 2019-09-15 DIAGNOSIS — I129 Hypertensive chronic kidney disease with stage 1 through stage 4 chronic kidney disease, or unspecified chronic kidney disease: Secondary | ICD-10-CM | POA: Diagnosis not present

## 2019-10-14 ENCOUNTER — Ambulatory Visit: Payer: Medicare Other | Admitting: Physician Assistant

## 2019-11-09 DIAGNOSIS — E113553 Type 2 diabetes mellitus with stable proliferative diabetic retinopathy, bilateral: Secondary | ICD-10-CM | POA: Diagnosis not present

## 2019-11-25 ENCOUNTER — Ambulatory Visit: Payer: Medicare Other | Admitting: Physician Assistant

## 2019-11-25 ENCOUNTER — Encounter: Payer: Self-pay | Admitting: Physician Assistant

## 2019-11-25 ENCOUNTER — Other Ambulatory Visit: Payer: Self-pay

## 2019-11-25 DIAGNOSIS — Z1283 Encounter for screening for malignant neoplasm of skin: Secondary | ICD-10-CM | POA: Diagnosis not present

## 2019-11-25 DIAGNOSIS — Z86018 Personal history of other benign neoplasm: Secondary | ICD-10-CM | POA: Diagnosis not present

## 2019-11-25 DIAGNOSIS — D0462 Carcinoma in situ of skin of left upper limb, including shoulder: Secondary | ICD-10-CM

## 2019-11-25 DIAGNOSIS — L570 Actinic keratosis: Secondary | ICD-10-CM | POA: Diagnosis not present

## 2019-11-25 DIAGNOSIS — Z85828 Personal history of other malignant neoplasm of skin: Secondary | ICD-10-CM | POA: Diagnosis not present

## 2019-11-25 DIAGNOSIS — C4492 Squamous cell carcinoma of skin, unspecified: Secondary | ICD-10-CM

## 2019-11-25 HISTORY — DX: Squamous cell carcinoma of skin, unspecified: C44.92

## 2019-11-25 MED ORDER — FLUOROURACIL 5 % EX CREA: TOPICAL_CREAM | Freq: Every day | CUTANEOUS | 0 refills | Status: DC

## 2019-11-25 NOTE — Progress Notes (Signed)
Follow-Up Visit   Subjective  Maurice Ross is a 79 y.o. male who presents for the following: Annual Exam (right groin- Dark spot , left pointer finger- came back).   The following portions of the chart were reviewed this encounter and updated as appropriate: Tobacco  Allergies  Meds  Problems  Med Hx  Surg Hx  Fam Hx      Objective  Well appearing patient in no apparent distress; mood and affect are within normal limits.  A full examination was performed including scalp, head, eyes, ears, nose, lips, neck, chest, axillae, abdomen, back, buttocks, bilateral upper extremities, bilateral lower extremities, hands, feet, fingers, toes, fingernails, and toenails. All findings within normal limits unless otherwise noted below.  Objective  Head to toe: No atypical nevi   Objective  Left Suprapubic Area: Scars clear  Objective  Left Anterior Mandible, Left Buccal Cheek , Left Supraclavicular Area: All scars clear  Objective  Left Forearm - Anterior, Neck - Anterior, Right Temple: All scars clear  Objective  Left Ear (3), Left Malar Cheek (5), Left Parietal Scalp, Mid Forehead, Mid Parietal Scalp (4), Right Anterior Helix (4), Right Buccal Cheek  (2), Right Ear (3), Right Root of Nose (2): Erythematous patches with gritty scale.  Objective  Left 2nd Finger Metacarpophalangeal Joint: Erythematous patches with gritty scale. Txpbx 1.6cm     Assessment & Plan  Screening exam for skin cancer Head to toe  Yearly skin exams  History of dysplastic nevus Left Suprapubic Area  observe  History of basal cell carcinoma (BCC) (3) Left Buccal Cheek ; Left Anterior Mandible; Left Supraclavicular Area  observe  History of SCC (squamous cell carcinoma) of skin (3) Neck - Anterior; Left Forearm - Anterior; Right Temple  observe  AK (actinic keratosis) (25) Left Ear (3); Right Ear (3); Right Anterior Helix (4); Left Parietal Scalp; Mid Parietal Scalp (4); Mid Forehead;  Right Root of Nose (2); Left Malar Cheek (5); Right Buccal Cheek  (2)  Destruction of lesion - Left Ear, Left Malar Cheek, Left Parietal Scalp, Mid Forehead, Mid Parietal Scalp, Right Anterior Helix, Right Buccal Cheek , Right Ear, Right Root of Nose Complexity: simple   Destruction method: cryotherapy   Informed consent: discussed and consent obtained   Timeout:  patient name, date of birth, surgical site, and procedure verified Lesion destroyed using liquid nitrogen: Yes   Cryotherapy cycles:  1 Outcome: patient tolerated procedure well with no complications   Post-procedure details: wound care instructions given    Ordered Medications: fluorouracil (EFUDEX) 5 % cream  Carcinoma in situ of skin of left upper extremity including shoulder Left 2nd Finger Metacarpophalangeal Joint  Skin / nail biopsy Type of biopsy: tangential   Informed consent: discussed and consent obtained   Timeout: patient name, date of birth, surgical site, and procedure verified   Anesthesia: the lesion was anesthetized in a standard fashion   Anesthetic:  1% lidocaine w/ epinephrine 1-100,000 local infiltration Instrument used: flexible razor blade   Hemostasis achieved with: aluminum chloride and electrodesiccation   Outcome: patient tolerated procedure well   Post-procedure details: wound care instructions given    Destruction of lesion Complexity: simple   Destruction method: electrodesiccation and curettage   Informed consent: discussed and consent obtained   Timeout:  patient name, date of birth, surgical site, and procedure verified Anesthesia: the lesion was anesthetized in a standard fashion   Anesthetic:  1% lidocaine w/ epinephrine 1-100,000 local infiltration Curettage performed in three different directions:  Yes   Electrodesiccation performed over the curetted area: Yes   Curettage cycles:  3 Margin per side (cm):  0.1 Final wound size (cm):  1.6 Hemostasis achieved with:  aluminum  chloride Outcome: patient tolerated procedure well with no complications   Post-procedure details: wound care instructions given    Specimen 1 - Surgical pathology Differential Diagnosis: scc Check Margins: yes Erythematous patches with gritty scale.   I, Morayo Leven, PA-C, have reviewed all documentation's for this visit.  The documentation on 11/30/19 for the exam, diagnosis, procedures and orders are all accurate and complete.

## 2019-12-02 ENCOUNTER — Encounter: Payer: Self-pay | Admitting: *Deleted

## 2019-12-02 ENCOUNTER — Telehealth: Payer: Self-pay | Admitting: *Deleted

## 2019-12-02 NOTE — Telephone Encounter (Signed)
Pathology to patient, recheck in 6 months

## 2019-12-02 NOTE — Telephone Encounter (Signed)
-----   Message from Warren Danes, [place] sent at 12/02/2019  8:17 AM EDT ----- 6 mo f/u

## 2019-12-02 NOTE — Telephone Encounter (Signed)
Left message for patient to return our call.

## 2019-12-10 ENCOUNTER — Encounter: Payer: Self-pay | Admitting: Cardiovascular Disease

## 2019-12-10 ENCOUNTER — Ambulatory Visit: Payer: Medicare Other | Admitting: Cardiovascular Disease

## 2019-12-10 ENCOUNTER — Other Ambulatory Visit: Payer: Self-pay

## 2019-12-10 VITALS — BP 160/88 | HR 62 | Temp 97.1°F | Ht 68.0 in | Wt 210.8 lb

## 2019-12-10 DIAGNOSIS — E118 Type 2 diabetes mellitus with unspecified complications: Secondary | ICD-10-CM

## 2019-12-10 DIAGNOSIS — E785 Hyperlipidemia, unspecified: Secondary | ICD-10-CM

## 2019-12-10 DIAGNOSIS — I2583 Coronary atherosclerosis due to lipid rich plaque: Secondary | ICD-10-CM

## 2019-12-10 DIAGNOSIS — I2510 Atherosclerotic heart disease of native coronary artery without angina pectoris: Secondary | ICD-10-CM | POA: Diagnosis not present

## 2019-12-10 DIAGNOSIS — N1832 Chronic kidney disease, stage 3b: Secondary | ICD-10-CM | POA: Diagnosis not present

## 2019-12-10 DIAGNOSIS — Z794 Long term (current) use of insulin: Secondary | ICD-10-CM

## 2019-12-10 NOTE — Patient Instructions (Signed)
Medication Instructions:  NO CHANGE *If you need a refill on your cardiac medications before your next appointment, please call your pharmacy*   Lab Work: Your physician recommends that you HAVE LAB WORK TODAY If you have labs (blood work) drawn today and your tests are completely normal, you will receive your results only by: Marland Kitchen MyChart Message (if you have MyChart) OR . A paper copy in the mail If you have any lab test that is abnormal or we need to change your treatment, we will call you to review the results.   Follow-Up: At [facility], you and your health needs are our priority.  As part of our continuing mission to provide you with exceptional heart care, we have created designated Provider Care Teams.  These Care Teams include your primary Cardiologist (physician) and Advanced Practice Providers (APPs -  Physician Assistants and Nurse Practitioners) who all work together to provide you with the care you need, when you need it.  We recommend signing up for the patient portal called "MyChart".  Sign up information is provided on this After Visit Summary.  MyChart is used to connect with patients for Virtual Visits (Telemedicine).  Patients are able to view lab/test results, encounter notes, upcoming appointments, etc.  Non-urgent messages can be sent to your provider as well.   To learn more about what you can do with MyChart, go to [email or web address].    Your next appointment:   12 month(s)  The format for your next appointment:   In Person  Provider:   You may see Shelva Majestic, MD or one of the following Advanced Practice Providers on your designated Care Team:    Almyra Deforest, PA-C  Fabian Sharp, PA-C or   Roby Lofts, [place]

## 2019-12-10 NOTE — Progress Notes (Signed)
Patient ID: Maurice Ross, male   DOB: Mar 30, 1940, 79 y.o.   MRN: [id number]   Primary MD: Dr. Rory Percy  HPI: Maurice Ross, is a 79 y.o. male who presents for a 13  month follow-up cardiology evaluation.  Mr. Radabaugh has established CAD and in July 1997 suffered an anterior wall myocardial infarction and underwent PTCA/stenting of his LAD. Repeat intervention was done in 1998 due to in-stent restenosis. At that time he also had moderate disease in his circumflex and right coronary artery which medical therapy has been recommended. His last nuclear perfusion study was done in August 2013 which showed old scar in the apical septal and apical region. Ejection fraction was 57%. This study was unchanged from 2 years previously.  He has history of type 2 diabetes mellitus  He, his hemoglobin A1c had risen to 12.6 on 12/23/2013.  Over the past year he had seen Dr. Soyla Murphy for endocrinologic evaluation.  Lab work by Dr. Nadara Mustard on 01/13/2015 still demonstrated increased hemoglobin A1c at 10.6.  She discontinued his Actos, Glucophage, and Glucotrol, and he is now taking Humalog insulin.  He states that his blood sugars are better.  He is also seeing Dr. Kellie Simmering who is following his kidney stones.  An echo Doppler study on 01/25/2014 showed an ejection fraction of 45-50% which was not significantly changed from his prior echo in September 2011.  There was grade 1 diastolic dysfunction.  There was evidence for aortic sclerosis.  There was mild pulmonic regurgitation.  He had increased stress  In 2016 with deaths of 3 of his family members including his father age 44, mother age 43, and mother-in-law on Christmas Eve age 15.    Since July 2017, he has had difficulty with kidney stones initially underwent a trial of lithotripsy but ultimately required surgical removal.  He developed allergic reaction to the anesthesia which led to hospitalization for 2 days in October 2017. An echo Doppler study was done during that  evaluation after he was found to have no troponin elevation.  He was found to have mild LVH with an EF of 50-55%.  There was mild hypokinesis of the mid distal anteroseptal wall, grade 1 diastolic dysfunction, mild LA enlargement, mild mitral annular calcification and he had normal pulmonary pressures.  I saw him in October 2018 and last saw him in April 2019. He denied any anginal symptomatology.  He was on lisinopril 5 mg, carvedilol 3.125 mg twice a day.  He was tolerating rosuvastatin 40 mg without side effects for hyperlipidemia.  He is diabetic on insulin. His hemoglobin A1c was 12.2 and in January 2019 his most recent hemoglobin A1c was 8.3.  He has not had any recurrent kidney stones October 2017.  During his last evaluation his blood pressure was well controlled.  I last saw him in August 2020.  His primary MD had retired and he was seeing [employer] PA.  He establish care with Dr. Michiel Sites of endocrinology for his diabetes mellitus a  In November 2019 LDL cholesterol was 50 total cholesterol 102.  Post recent hemoglobin A1c on August 17, 2018 remained elevated at 8.7.  He has chronic kidney disease laboratory on September 14, 2018 showed a creatinine of 1.94.  He remained active and does yard work.  He denied chest pain PND orthopnea or awareness of palpitations.   Since his last evaluation, he has continued to do well.  He states his blood pressure at home typically runs in the 120s over  70s.  He is been without angina shortness of breath or palpitations and remains asymptomatic.  At times he believes that his balance may not be as good particularly on uneven ground.  He denies any dizziness or syncope.  He has not had recent laboratory.  He presents for evaluation.  Past Medical History:  Diagnosis Date   CKD (chronic kidney disease), stage III    Coronary artery disease CARDIOLOGIST-  DR Shelva Majestic   Coronary stent 1997, Last cath 1998, Last Nuc 10/24/2011-no change from previous, Last Echo 2011  EF 45%   Diabetes mellitus type 2, uncontrolled ([place])    per pt last A1c 8.8 in Aug 2017   GERD (gastroesophageal reflux disease)    History of acute anterior wall MI    1997   History of adenomatous polyp of colon    adenomatous 2004;   tubular adenoma 2013   History of basal cell carcinoma excision    05-24-2015  left cheek   History of esophageal dilatation    Hyperlipidemia    lipomet analysis LDL particle # at 786   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)    Renal calculus, right    Renal cyst    S/P coronary artery stent placement    1997  --  PCI  and stenting to LAD   Squamous cell carcinoma of skin 11/25/2019   IN SITU- left 2nd finger metacarpophalangeal joint (txpbx)   Wears glasses    Wears hearing aid    bilateral    Past Surgical History:  Procedure Laterality Date   BALLOON DILATION  01/24/2012   Procedure: BALLOON DILATION;  Surgeon: Rogene Houston, MD;  [facility];  Service: Endoscopy;  Laterality: N/A;   CARDIOVASCULAR STRESS TEST  10-24-2011   dr Claiborne Billings   Low risk nuclear study w/ mild to moderate perfustion defect due to infarct/scar w/  mild perinfarct ischemia in the apical septal and apical regions/  normal LV function and mild hypokinesis in the apical and apical septal (no sign. change compared to previous study()   CATARACT EXTRACTION W/ INTRAOCULAR LENS  IMPLANT, BILATERAL  2015   COLONOSCOPY N/A 04/10/2017   Procedure: COLONOSCOPY;  Surgeon: Rogene Houston, MD;  [facility]: [facility];  Service: Endoscopy;  Laterality: N/A;  1115   COLONOSCOPY WITH ESOPHAGOGASTRODUODENOSCOPY (EGD)  01/24/2012   Procedure: COLONOSCOPY WITH ESOPHAGOGASTRODUODENOSCOPY (EGD);  Surgeon: Rogene Houston, MD;  [facility];  Service: Endoscopy;  Laterality: N/A;  955   CORONARY ANGIOPLASTY  06/08/1996   dr Shelva Majestic   Balloon angioplasty to  pLAD for in-stent restenosis, no change mLAD 30-50%/   narrowing  midRCA 40-50%/  mild ectasia of LCX w/ 30-50%  proximal and marginal narrowing/  mild LV dysfunction w/ anterolateral hypocontractility   CORONARY ANGIOPLASTY WITH STENT PLACEMENT  09-15-1995    dr Claiborne Billings   PTCA/ Stenting (palmaz-schatz)  to proximal LAD   CYSTOSCOPY WITH STENT PLACEMENT Right 01/01/2016   Procedure: CYSTOSCOPY WITH STENT PLACEMENT;  Surgeon: Franchot Gallo, MD;  Location: [facility];  Service: Urology;  Laterality: Right;   CYSTOSCOPY/RETROGRADE/URETEROSCOPY/STONE EXTRACTION WITH BASKET Right 01/01/2016   Procedure: CYSTOSCOPY/RETROGRADE/URETEROSCOPY;  Surgeon: Franchot Gallo, MD;  Location: [facility];  Service: Urology;  Laterality: Right;   EXTRACORPOREAL SHOCK WAVE LITHOTRIPSY  10-30-2015;   11-25-2011   HOLMIUM LASER APPLICATION Right 21/19/4174   Procedure: HOLMIUM LASER APPLICATION;  Surgeon: Franchot Gallo, MD;  Location: [facility];  Service: Urology;  Laterality: Right;   LUMBAR MICRODISCECTOMY  1998   L5 -- S1   MALONEY DILATION  01/24/2012   Procedure: MALONEY DILATION;  Surgeon: Rogene Houston, MD;  [facility];  Service: Endoscopy;  Laterality: N/A;   POLYPECTOMY  04/10/2017   Procedure: POLYPECTOMY;  Surgeon: Rogene Houston, MD;  [facility];  Service: Endoscopy;;   SAVORY DILATION  01/24/2012   Procedure: SAVORY DILATION;  Surgeon: Rogene Houston, MD;  [facility];  Service: Endoscopy;  Laterality: N/A;   TRANSTHORACIC ECHOCARDIOGRAM  01/25/2014   mild concentric LVH, ef 19-37%, grade 1 diastolic dysfunction/  mild PR    No Known Allergies  Current Outpatient Medications  Medication Sig Dispense Refill   aspirin 81 MG tablet Take 1 tablet (81 mg total) by mouth daily. 30 tablet    carvedilol (COREG) 3.125 MG tablet Take 1 tablet by mouth two  times daily (Patient taking differently: Take 3.125 mg by mouth twice daily) 180 tablet 1    fluorouracil (EFUDEX) 5 % cream Apply topically daily. apply qhs x 2 weeds 40 g 0   HUMULIN 70/30 (70-30) 100 UNIT/ML injection Inject into the skin.     Insulin Lispro Prot & Lispro (HUMALOG MIX 75/25 KWIKPEN [place]) Inject 30-40 Units into the skin See admin instructions. Take 40 units in the morning and 30 units at lunch and 40 units before meals at night     lisinopril (PRINIVIL,ZESTRIL) 5 MG tablet Take 5 mg by mouth daily.      ONETOUCH VERIO test strip 1 each 3 (three) times daily.     potassium citrate (UROCIT-K) 10 MEQ (1080 MG) SR tablet Take 10 mEq by mouth 2 (two) times daily.     ranitidine (ZANTAC) 150 MG tablet Take 150 mg by mouth daily.      rosuvastatin (CRESTOR) 40 MG tablet Take 40 mg by mouth every evening.      No current facility-administered medications for this visit.    Social History   Socioeconomic History   Marital status: Married    Spouse name: Blanch Media   Number of children: 2   Years of education: Not on file   Highest education level: Not on file  Occupational History   Occupation: retired Engineer, manufacturing systems  Tobacco Use   Smoking status: Former Smoker    Years: 3.00    Types: Pipe, Cigars    Quit date: 12/27/2011    Years since quitting: 7.9   Smokeless tobacco: Never Used  Vaping Use   Vaping Use: Never used  Substance and Sexual Activity   Alcohol use: No    Alcohol/week: 0.0 standard drinks   Drug use: No   Sexual activity: Not on file  Other Topics Concern   Not on file  Social History Narrative   Not on file   Social Determinants of Health   Financial Resource Strain:    Difficulty of Paying Living Expenses: Not on file  Food Insecurity:    Worried About Charity fundraiser in the Last Year: Not on file   [place] in the Last Year: Not on file  Transportation Needs:    Lack of Transportation (Medical): Not on file   Lack of Transportation (Non-Medical): Not on file  Physical Activity:    Days of Exercise  per Week: Not on file   Minutes of Exercise per Session: Not on file  Stress:  Feeling of Stress : Not on file  Social Connections:    Frequency of Communication with Friends and Family: Not on file   Frequency of Social Gatherings with Friends and Family: Not on file   Attends Religious Services: Not on file   Active Member of Clubs or Organizations: Not on file   Attends Archivist Meetings: Not on file   Marital Status: Not on file  Intimate Partner Violence:    Fear of Current or Ex-Partner: Not on file   Emotionally Abused: Not on file   Physically Abused: Not on file   Sexually Abused: Not on file    Family History  Problem Relation Age of Onset   Heart disease Mother 33   Heart disease Father 77   Healthy Son    Healthy Son    Social history is notable that he is married has 2 children. He does not routinely exercise. There is no tobacco or alcohol use.   ROS General: Negative; No fevers, chills, or night sweats;  HEENT: Negative; No changes in vision or hearing, sinus congestion, difficulty swallowing Pulmonary: Negative; No cough, wheezing, shortness of breath, hemoptysis Cardiovascular: Negative; No chest pain, presyncope, syncope, palpitations GI: positive for GERD; No nausea, vomiting, diarrhea, or abdominal pain GU: Positive for kidney stones; No dysuria, hematuria, or difficulty voiding Musculoskeletal: Negative; no myalgias, joint pain, or weakness Hematologic/Oncology: Negative; no easy bruising, bleeding Endocrine: positive for poorly controlled diabetes mellitus.  No thyroid abnormalities. Neuro: Negative; no changes in balance, headaches Skin: Negative; No rashes or skin lesions Psychiatric: Negative; No behavioral problems, depression Sleep: Negative; No snoring, daytime sleepiness, hypersomnolence, bruxism, restless legs, hypnogognic hallucinations, no cataplexy Other comprehensive 14 point system review is  negative.   PE BP (!) 160/88    Pulse 62    Temp (!) 97.1 F (36.2 C)    Ht '5\' 8"'  (1.727 m)    Wt 210 lb 12.8 oz (95.6 kg)    SpO2 98%    BMI 32.05 kg/m    Blood pressure today was elevated at 156/82 but he did not take his medications this morning  Wt Readings from Last 3 Encounters:  12/10/19 210 lb 12.8 oz (95.6 kg)  11/12/18 203 lb (92.1 kg)  09/24/18 206 lb 11.2 oz (93.8 kg)   General: Alert, oriented, no distress.  Skin: normal turgor, no rashes, warm and dry HEENT: Normocephalic, atraumatic. Pupils equal round and reactive to light; sclera anicteric; extraocular muscles intact; Nose without nasal septal hypertrophy Mouth/Parynx benign; Mallinpatti scale 3 Neck: No JVD, no carotid bruits; normal carotid upstroke Lungs: clear to ausculatation and percussion; no wheezing or rales Chest wall: without tenderness to palpitation Heart: PMI not displaced, RRR, s1 s2 normal, 1/6 systolic murmur, no diastolic murmur, no rubs, gallops, thrills, or heaves Abdomen: soft, nontender; no hepatosplenomehaly, BS+; abdominal aorta nontender and not dilated by palpation. Back: no CVA tenderness Pulses 2+ Musculoskeletal: full range of motion, normal strength, no joint deformities Extremities: no clubbing cyanosis or edema, Homan's sign negative  Neurologic: grossly nonfocal; Cranial nerves grossly wnl Psychologic: Normal mood and affect   ECG (independently read by me): Normal sinus rhythm at 62 bpm.  Right bundle branch block, left anterior hemiblock.  QTc interval 456 ms.  August 2020 ECG (independently read by me): Sinus bradycardia 54 bpm with mild sinus arrhythmia, right bundle branch block with repolarization changes, left anterior hemiblock.  April 2019 ECG (independently read by me): Sinus bradycardia 56 bpm.  Right bundle branch  block with repolarization changes.  Left anterior hemiblock.  Normal intervals.  No ectopy.  October 2018 ECG (independently read by me): normal sinus  rhythm at 66 bpm.  Right bundle-branch block with repolarization changes.  Left anterior hemiblock.  Normal intervals.  March 2018 ECG (independently read by me): Sinus Bradycardia 56 bpm, right bundle branch block, left anterior hemiblock, LVH.  January 2017 ECG (independently read by me): Normal sinus rhythm at 60 bpm.  Right bundle-branch block and left anterior hemiblock consistent with bifascicular block.  QTc interval 432 ms.  February 2016 ECG (independently read by me): Sinus bradycardia 56 bpm.  Right bundle-branch block with repolarization changes.  Septal Q wave in V1.  November 2015 ECG (independently read by me): Marked sinus bradycardia at 44 bpm.  Right bundle branch block.  Probable left anterior fascicular block. Small septal Q waves  October 2014 ECG: Sinus bradycardia with right bundle branch block. Small Q waves in leads V1 and V2 concord with septal infarct that is old. Nonspecific T changes laterally.  LABS:  Viewed recent November 2018 laboratory by Dr. Nadara Mustard.  Total cholesterol 118, HDL 36, LDL 61, triglycerides 105.  Globin 17.3.  Creatinine 1.87. HbA1c 8.3  BMP Latest Ref Rng & Units 12/10/2019 01/09/2016 01/08/2016  Glucose 65 - 99 mg/dL 260(H) 166(H) 230(H)  BUN 8 - 27 mg/dL 19 40(H) 44(H)  Creatinine 0.76 - 1.27 mg/dL 1.87(H) 1.91(H) 2.41(H)  BUN/Creat Ratio 10 - 24 10 - -  Sodium 134 - 144 mmol/L 140 132(L) 130(L)  Potassium 3.5 - 5.2 mmol/L 5.5(H) 3.6 4.0  Chloride 96 - 106 mmol/L 104 106 101  CO2 20 - 29 mmol/L 24 19(L) 21(L)  Calcium 8.6 - 10.2 mg/dL 9.9 8.2(L) 8.8(L)   Hepatic Function Latest Ref Rng & Units 12/10/2019 01/09/2016 01/08/2016  Total Protein 6.0 - 8.5 g/dL 6.3 5.6(L) 6.2(L)  Albumin 3.7 - 4.7 g/dL 4.2 2.4(L) 2.8(L)  AST 0 - 40 IU/L 16 79(H) 79(H)  ALT 0 - 44 IU/L 23 123(H) 105(H)  Alk Phosphatase 44 - 121 IU/L 65 67 70  Total Bilirubin 0.0 - 1.2 mg/dL 0.4 0.9 1.0   CBC Latest Ref Rng & Units 12/10/2019 01/08/2016 01/01/2016  WBC 3.4 -  10.8 x10E3/uL 6.1 10.4 -  Hemoglobin 13.0 - 17.7 g/dL 14.9 15.8 16.3  Hematocrit 37.5 - 51.0 % 46.3 44.8 48.0  Platelets 150 - 450 x10E3/uL 160 173 -   Lab Results  Component Value Date   MCV 82 12/10/2019   MCV 84.2 01/08/2016   MCV 83.3 09/16/2015   Lab Results  Component Value Date   TSH 1.500 12/10/2019   Lab Results  Component Value Date   HGBA1C WILL FOLLOW 12/10/2019   Lipid Panel     Component Value Date/Time   CHOL 119 12/10/2019 0932   TRIG 95 12/10/2019 0932   HDL 42 12/10/2019 0932   CHOLHDL 2.8 12/10/2019 0932   CHOLHDL 3.1 11/30/2013 0847   VLDL 27 11/30/2013 0847   LDLCALC 59 12/10/2019 0932     RADIOLOGY: No results found.  IMPRESSION:  1. Coronary artery disease due to lipid rich plaque   2. Hyperlipidemia LDL goal <70   3. Type 2 diabetes mellitus with complication, with long-term current use of insulin (HCC)   4. Stage 3b chronic kidney disease ([place])     ASSESSMENT AND PLAN: Mr. Alfred Harrel is a 79 year old Caucasian male who suffered an anterior wall myocardial infarction in 1997 and underwent initial intervention to his  LAD and required repeat intervention secondary to restenosis in 1998. He has concomitant CAD and has been on medical therapy. His nuclear perfusion study in August 2013 was unchanged from 2011 and only showed a very small region of abnormality in the apical septal region. His last echo Doppler study in October 2017 showed an EF of 50-55% with mild LVH and mild hypokinesis of the mid to distal anteroseptal wall with grade 1 diastolic dysfunction.  There was mild LA enlargement, mild mitral annular calcification, and trivial tricuspid regurgitation with normal pulmonary pressure.  He states his blood pressure at home has been stable in the 120s over 70s.  He has been on a regimen of carvedilol 3.125 mg twice a day and lisinopril 5 mg.  Blood pressure today is elevated but he did not take his medications which may be playing a role in this  elevation.  He continues to be on rosuvastatin 40 mg for hyperlipidemia.  He has not had recent lipid studies.  He is diabetic on insulin.  He has chronic kidney disease, stage IIIb.  Most recent creatinine in April 2021 had improved to 1.79.  He is fasting and I will check chemistry profile CBC TSH hemoglobin A1c as well as lipid studies.  BMI is 32.05.  We discussed weight loss and continued exercise.  As long as he remains stable I will see him in 1 year for reevaluation or sooner as necessary.   Troy Sine, MD, [facility]  12/10/2019 8:00 PM

## 2019-12-11 LAB — CBC
Hematocrit: 46.3 % (ref 37.5–51.0)
Hemoglobin: 14.9 g/dL (ref 13.0–17.7)
MCH: 26.3 pg — ABNORMAL LOW (ref 26.6–33.0)
MCHC: 32.2 g/dL (ref 31.5–35.7)
MCV: 82 fL (ref 79–97)
Platelets: 160 10*3/uL (ref 150–450)
RBC: 5.67 x10E6/uL (ref 4.14–5.80)
RDW: 14.5 % (ref 11.6–15.4)
WBC: 6.1 10*3/uL (ref 3.4–10.8)

## 2019-12-11 LAB — COMPREHENSIVE METABOLIC PANEL
ALT: 23 IU/L (ref 0–44)
AST: 16 IU/L (ref 0–40)
Albumin/Globulin Ratio: 2 (ref 1.2–2.2)
Albumin: 4.2 g/dL (ref 3.7–4.7)
Alkaline Phosphatase: 65 IU/L (ref 44–121)
BUN/Creatinine Ratio: 10 (ref 10–24)
BUN: 19 mg/dL (ref 8–27)
Bilirubin Total: 0.4 mg/dL (ref 0.0–1.2)
CO2: 24 mmol/L (ref 20–29)
Calcium: 9.9 mg/dL (ref 8.6–10.2)
Chloride: 104 mmol/L (ref 96–106)
Creatinine, Ser: 1.87 mg/dL — ABNORMAL HIGH (ref 0.76–1.27)
GFR calc Af Amer: 39 mL/min/{1.73_m2} — ABNORMAL LOW (ref >59)
GFR calc non Af Amer: 34 mL/min/{1.73_m2} — ABNORMAL LOW (ref >59)
Globulin, Total: 2.1 g/dL (ref 1.5–4.5)
Glucose: 260 mg/dL — ABNORMAL HIGH (ref 65–99)
Potassium: 5.5 mmol/L — ABNORMAL HIGH (ref 3.5–5.2)
Sodium: 140 mmol/L (ref 134–144)
Total Protein: 6.3 g/dL (ref 6.0–8.5)

## 2019-12-11 LAB — TSH: TSH: 1.5 u[IU]/mL (ref 0.450–4.500)

## 2019-12-11 LAB — LIPID PANEL
Chol/HDL Ratio: 2.8 ratio (ref 0.0–5.0)
Cholesterol, Total: 119 mg/dL (ref 100–199)
HDL: 42 mg/dL (ref >39)
LDL Chol Calc (NIH): 59 mg/dL (ref 0–99)
Triglycerides: 95 mg/dL (ref 0–149)
VLDL Cholesterol Cal: 18 mg/dL (ref 5–40)

## 2019-12-11 LAB — HEMOGLOBIN A1C
Est. average glucose Bld gHb Est-mCnc: 197 mg/dL
Hgb A1c MFr Bld: 8.5 % — ABNORMAL HIGH (ref 4.8–5.6)

## 2019-12-13 ENCOUNTER — Other Ambulatory Visit: Payer: Self-pay

## 2019-12-13 DIAGNOSIS — E875 Hyperkalemia: Secondary | ICD-10-CM

## 2019-12-14 ENCOUNTER — Other Ambulatory Visit: Payer: Self-pay

## 2019-12-15 ENCOUNTER — Telehealth: Payer: Self-pay | Admitting: Cardiovascular Disease

## 2019-12-15 NOTE — Telephone Encounter (Signed)
Patient is returning a call. He states he received an additional call regarding lab results yesterday, 12/14/19 in the afternoon. Please call.

## 2019-12-15 NOTE — Telephone Encounter (Signed)
Returned call to pt. He voiced wanting to make sure he was clear as far as lab results instructions.   Pt advised to stop potassium and repeat lab work in a week. Pt verbalized understanding.

## 2019-12-22 ENCOUNTER — Other Ambulatory Visit: Payer: Self-pay

## 2019-12-22 DIAGNOSIS — E875 Hyperkalemia: Secondary | ICD-10-CM | POA: Diagnosis not present

## 2019-12-22 LAB — BASIC METABOLIC PANEL
BUN/Creatinine Ratio: 10 (ref 10–24)
BUN: 18 mg/dL (ref 8–27)
CO2: 23 mmol/L (ref 20–29)
Calcium: 9.6 mg/dL (ref 8.6–10.2)
Chloride: 107 mmol/L — ABNORMAL HIGH (ref 96–106)
Creatinine, Ser: 1.88 mg/dL — ABNORMAL HIGH (ref 0.76–1.27)
GFR calc Af Amer: 39 mL/min/{1.73_m2} — ABNORMAL LOW (ref >59)
GFR calc non Af Amer: 33 mL/min/{1.73_m2} — ABNORMAL LOW (ref >59)
Glucose: 125 mg/dL — ABNORMAL HIGH (ref 65–99)
Potassium: 5.1 mmol/L (ref 3.5–5.2)
Sodium: 141 mmol/L (ref 134–144)

## 2020-02-17 DIAGNOSIS — K219 Gastro-esophageal reflux disease without esophagitis: Secondary | ICD-10-CM | POA: Diagnosis not present

## 2020-02-17 DIAGNOSIS — I119 Hypertensive heart disease without heart failure: Secondary | ICD-10-CM | POA: Diagnosis not present

## 2020-02-17 DIAGNOSIS — N189 Chronic kidney disease, unspecified: Secondary | ICD-10-CM | POA: Diagnosis not present

## 2020-02-17 DIAGNOSIS — E7800 Pure hypercholesterolemia, unspecified: Secondary | ICD-10-CM | POA: Diagnosis not present

## 2020-02-17 DIAGNOSIS — E1165 Type 2 diabetes mellitus with hyperglycemia: Secondary | ICD-10-CM | POA: Diagnosis not present

## 2020-02-21 DIAGNOSIS — Z0000 Encounter for general adult medical examination without abnormal findings: Secondary | ICD-10-CM | POA: Diagnosis not present

## 2020-02-21 DIAGNOSIS — Z1389 Encounter for screening for other disorder: Secondary | ICD-10-CM | POA: Diagnosis not present

## 2020-08-29 ENCOUNTER — Ambulatory Visit: Payer: Medicare Other | Admitting: Physician Assistant

## 2020-10-04 ENCOUNTER — Encounter: Payer: Self-pay | Admitting: Physician Assistant

## 2020-10-04 ENCOUNTER — Ambulatory Visit: Payer: Medicare Other | Admitting: Physician Assistant

## 2020-10-04 ENCOUNTER — Other Ambulatory Visit: Payer: Self-pay

## 2020-10-04 DIAGNOSIS — C4441 Basal cell carcinoma of skin of scalp and neck: Secondary | ICD-10-CM

## 2020-10-04 DIAGNOSIS — Z85828 Personal history of other malignant neoplasm of skin: Secondary | ICD-10-CM

## 2020-10-04 DIAGNOSIS — Z1283 Encounter for screening for malignant neoplasm of skin: Secondary | ICD-10-CM

## 2020-10-04 DIAGNOSIS — D0439 Carcinoma in situ of skin of other parts of face: Secondary | ICD-10-CM

## 2020-10-04 DIAGNOSIS — C4491 Basal cell carcinoma of skin, unspecified: Secondary | ICD-10-CM

## 2020-10-04 DIAGNOSIS — C44329 Squamous cell carcinoma of skin of other parts of face: Secondary | ICD-10-CM | POA: Diagnosis not present

## 2020-10-04 DIAGNOSIS — C4492 Squamous cell carcinoma of skin, unspecified: Secondary | ICD-10-CM

## 2020-10-04 DIAGNOSIS — Z86018 Personal history of other benign neoplasm: Secondary | ICD-10-CM

## 2020-10-04 DIAGNOSIS — L570 Actinic keratosis: Secondary | ICD-10-CM | POA: Diagnosis not present

## 2020-10-04 DIAGNOSIS — D485 Neoplasm of uncertain behavior of skin: Secondary | ICD-10-CM

## 2020-10-04 HISTORY — DX: Basal cell carcinoma of skin, unspecified: C44.91

## 2020-10-04 HISTORY — DX: Squamous cell carcinoma of skin, unspecified: C44.92

## 2020-10-04 NOTE — Patient Instructions (Signed)

## 2020-10-04 NOTE — Progress Notes (Signed)
Follow-Up Visit   Subjective  Maurice Ross is a 80 y.o. male who presents for the following: Annual Exam (Skin exam no concerns, history of scc bcc and mild atypia ). All scars clear and healed without any problems.   The following portions of the chart were reviewed this encounter and updated as appropriate:  Tobacco  Allergies  Meds  Problems  Med Hx  Surg Hx  Fam Hx      Objective  Well appearing patient in no apparent distress; mood and affect are within normal limits.  A full examination was performed including scalp, head, eyes, ears, nose, lips, neck, chest, axillae, abdomen, back, buttocks, bilateral upper extremities, bilateral lower extremities, hands, feet, fingers, toes, fingernails, and toenails. All findings within normal limits unless otherwise noted below.  Right Anterior Neck Hyperkeratotic scale with pink base      Right Malar Cheek Hyperkeratotic scale with pink base      Right sideburn Hyperkeratotic scale with pink base      Left Antihelix, Left Forehead, Left Malar Cheek, Left Posterior Neck, Left Zygomatic Area Erythematous patches with gritty scale.   Assessment & Plan  Neoplasm of uncertain behavior of skin (3) Right Anterior Neck  Skin / nail biopsy Type of biopsy: tangential   Informed consent: discussed and consent obtained   Timeout: patient name, date of birth, surgical site, and procedure verified   Anesthesia: the lesion was anesthetized in a standard fashion   Anesthetic:  1% lidocaine w/ epinephrine 1-100,000 local infiltration Instrument used: flexible razor blade   Hemostasis achieved with: ferric subsulfate   Outcome: patient tolerated procedure well   Post-procedure details: wound care instructions given    Specimen 1 - Surgical pathology Differential Diagnosis: scc vs bcc  Check Margins: yes  Right Malar Cheek  Skin / nail biopsy Type of biopsy: tangential   Informed consent: discussed and consent obtained    Timeout: patient name, date of birth, surgical site, and procedure verified   Anesthesia: the lesion was anesthetized in a standard fashion   Anesthetic:  1% lidocaine w/ epinephrine 1-100,000 local infiltration Instrument used: flexible razor blade   Hemostasis achieved with: ferric subsulfate   Outcome: patient tolerated procedure well   Post-procedure details: wound care instructions given    Specimen 2 - Surgical pathology Differential Diagnosis: scc vs bcc  Check Margins: yes  Right sideburn  Skin / nail biopsy Type of biopsy: tangential   Informed consent: discussed and consent obtained   Timeout: patient name, date of birth, surgical site, and procedure verified   Anesthesia: the lesion was anesthetized in a standard fashion   Anesthetic:  1% lidocaine w/ epinephrine 1-100,000 local infiltration Instrument used: flexible razor blade   Hemostasis achieved with: ferric subsulfate   Outcome: patient tolerated procedure well   Post-procedure details: wound care instructions given    Specimen 3 - Surgical pathology Differential Diagnosis: scc vs bcc  Check Margins: yes  AK (actinic keratosis) (5) Left Antihelix; Left Zygomatic Area; Left Malar Cheek; Left Posterior Neck; Left Forehead  Destruction of lesion - Left Antihelix, Left Forehead, Left Malar Cheek, Left Posterior Neck, Left Zygomatic Area Complexity: simple   Destruction method: cryotherapy   Informed consent: discussed and consent obtained   Timeout:  patient name, date of birth, surgical site, and procedure verified Lesion destroyed using liquid nitrogen: Yes   Cryotherapy cycles:  1 Outcome: patient tolerated procedure well with no complications   Post-procedure details: wound care instructions given  Related Medications fluorouracil (EFUDEX) 5 % cream Apply topically daily. apply qhs x 2 weeds    I, Jaden Batchelder, PA-C, have reviewed all documentation's for this visit.  The documentation on  10/04/20 for the exam, diagnosis, procedures and orders are all accurate and complete.

## 2020-10-18 ENCOUNTER — Telehealth: Payer: Self-pay

## 2020-10-18 NOTE — Telephone Encounter (Signed)
-----   Message from Warren Danes, [place] sent at 10/17/2020  6:06 PM EDT ----- 2.  30 with KRS 1. And 3. Mohs

## 2020-10-18 NOTE — Telephone Encounter (Signed)
Phone call to patient with his pathology results. Patient aware.

## 2021-01-12 ENCOUNTER — Encounter: Payer: Self-pay | Admitting: Cardiovascular Disease

## 2021-01-12 ENCOUNTER — Other Ambulatory Visit: Payer: Self-pay

## 2021-01-12 ENCOUNTER — Ambulatory Visit: Payer: Medicare Other | Admitting: Cardiovascular Disease

## 2021-01-12 VITALS — BP 135/61 | HR 56 | Ht 66.0 in | Wt 213.4 lb

## 2021-01-12 DIAGNOSIS — N1832 Chronic kidney disease, stage 3b: Secondary | ICD-10-CM

## 2021-01-12 DIAGNOSIS — E785 Hyperlipidemia, unspecified: Secondary | ICD-10-CM

## 2021-01-12 DIAGNOSIS — E118 Type 2 diabetes mellitus with unspecified complications: Secondary | ICD-10-CM | POA: Diagnosis not present

## 2021-01-12 DIAGNOSIS — Z794 Long term (current) use of insulin: Secondary | ICD-10-CM

## 2021-01-12 DIAGNOSIS — I2510 Atherosclerotic heart disease of native coronary artery without angina pectoris: Secondary | ICD-10-CM

## 2021-01-12 DIAGNOSIS — I2583 Coronary atherosclerosis due to lipid rich plaque: Secondary | ICD-10-CM

## 2021-01-12 NOTE — Patient Instructions (Signed)
Medication Instructions:  Your physician recommends that you continue on your current medications as directed. Please refer to the Current Medication list given to you today.  *If you need a refill on your cardiac medications before your next appointment, please call your pharmacy*   Lab Work: Your physician recommends that you have labs drawn today: Lipids/CMET/CBC/A1C/ TSH  If you have labs (blood work) drawn today and your tests are completely normal, you will receive your results only by: [place] (if you have MyChart) OR A paper copy in the mail If you have any lab test that is abnormal or we need to change your treatment, we will call you to review the results.   Follow-Up: At [facility], you and your health needs are our priority.  As part of our continuing mission to provide you with exceptional heart care, we have created designated Provider Care Teams.  These Care Teams include your primary Cardiologist (physician) and Advanced Practice Providers (APPs -  Physician Assistants and Nurse Practitioners) who all work together to provide you with the care you need, when you need it.  We recommend signing up for the patient portal called "MyChart".  Sign up information is provided on this After Visit Summary.  MyChart is used to connect with patients for Virtual Visits (Telemedicine).  Patients are able to view lab/test results, encounter notes, upcoming appointments, etc.  Non-urgent messages can be sent to your provider as well.   To learn more about what you can do with MyChart, go to [email or web address].    Your next appointment:   12 month(s)  The format for your next appointment:   In Person  Provider:   Shelva Majestic, MD

## 2021-01-12 NOTE — Progress Notes (Signed)
Patient ID: Maurice Ross, male   DOB: 08/31/1940, 80 y.o.   MRN: [id number]   Primary MD: Dr. Rory Percy  HPI: Maurice Ross, is a 80 y.o. male who presents for a 13 month follow-up cardiology evaluation.  Mr. Flater has established CAD and in July 1997 suffered an anterior wall myocardial infarction and underwent PTCA/stenting of his LAD. Repeat intervention was done in 1998 due to in-stent restenosis. At that time he also had moderate disease in his circumflex and right coronary artery which medical therapy has been recommended. His last nuclear perfusion study was done in August 2013 which showed old scar in the apical septal and apical region. Ejection fraction was 57%. This study was unchanged from 2 years previously.  He has history of type 2 diabetes mellitus  He, his hemoglobin A1c had risen to 12.6 on 12/23/2013.  Over the past year he had seen Dr. Soyla Murphy for endocrinologic evaluation.  Lab work by Dr. Nadara Mustard on 01/13/2015 still demonstrated increased hemoglobin A1c at 10.6.  She discontinued his Actos, Glucophage, and Glucotrol, and he is now taking Humalog insulin.  He states that his blood sugars are better.  He is also seeing Dr. Kellie Simmering who is following his kidney stones.  An echo Doppler study on 01/25/2014 showed an ejection fraction of 45-50% which was not significantly changed from his prior echo in September 2011.  There was grade 1 diastolic dysfunction.  There was evidence for aortic sclerosis.  There was mild pulmonic regurgitation.  He had increased stress  In 2016 with deaths of 3 of his family members including his father age 13, mother age 56, and mother-in-law on Christmas Eve age 74.    Since July 2017, he has had difficulty with kidney stones initially underwent a trial of lithotripsy but ultimately required surgical removal.  He developed allergic reaction to the anesthesia which led to hospitalization for 2 days in October 2017. An echo Doppler study was done during that  evaluation after he was found to have no troponin elevation.  He was found to have mild LVH with an EF of 50-55%.  There was mild hypokinesis of the mid distal anteroseptal wall, grade 1 diastolic dysfunction, mild LA enlargement, mild mitral annular calcification and he had normal pulmonary pressures.  I saw him in October 2018 and last saw him in April 2019. He denied any anginal symptomatology.  He was on lisinopril 5 mg, carvedilol 3.125 mg twice a day.  He was tolerating rosuvastatin 40 mg without side effects for hyperlipidemia.  He is diabetic on insulin. His hemoglobin A1c was 12.2 and in January 2019 his most recent hemoglobin A1c was 8.3.  He has not had any recurrent kidney stones October 2017.  During his last evaluation his blood pressure was well controlled.  I saw him in August 2020.  His primary MD had retired and he was seeing [employer] PA.  He establish care with Dr. Michiel Sites of endocrinology for his diabetes mellitus a  In November 2019 LDL cholesterol was 50 total cholesterol 102.  Post recent hemoglobin A1c on August 17, 2018 remained elevated at 8.7.  He has chronic kidney disease laboratory on September 14, 2018 showed a creatinine of 1.94.  He remained active and does yard work.  He denied chest pain PND orthopnea or awareness of palpitations.   I last saw him on December 10, 2019.  Since his prior evaluation he continued to do well.  His blood pressure at home was typically  running in the 120s over 70s.  He was without angina, shortness of breath or palpitations and remained asymptomatic.   At times he believes that his balance may not be as good particularly on uneven ground.  He denies any dizziness or syncope.  He has stage IIIb CKD and creatinine in April 2021 had improved to 1.79.  Since I last saw him, he is followed by Aggie Hacker, PA for primary care since his main physician had retired.  He remains active and does gardening.  He believes he is sleeping well and typically goes to  bed at 10 PM and wakes up at 7 AM.  He remains active without chest pain, shortness of breath, or palpitations.  He sees Dr. Michiel Sites for his diabetes mellitus.  In October 2021 creatinine had increased to 1.88 and in December 2021 creatinine was 1.93.  Hemoglobin A1c was 7.6 in July 2022.  He presents for evaluation.    Past Medical History:  Diagnosis Date   Atypical mole 05/2015   right supra pubic mild atypia   Basal cell carcinoma 2000   right jawline curet 3 exc   Basal cell carcinoma 2017   left cheek cx3 58f   Basal cell carcinoma 2019   left clavical cx3 556f  CKD (chronic kidney disease), stage III (HCC)    Coronary artery disease CARDIOLOGIST-  DR THShelva Majestic Coronary stent 1997, Last cath 1998, Last Nuc 10/24/2011-no change from previous, Last Echo 2011 EF 45%   Diabetes mellitus type 2, uncontrolled    per pt last A1c 8.8 in Aug 2017   GERD (gastroesophageal reflux disease)    History of acute anterior wall MI    1997   History of adenomatous polyp of colon    adenomatous 2004;   tubular adenoma 2013   History of basal cell carcinoma excision    05-24-2015  left cheek   History of esophageal dilatation    Hyperlipidemia    lipomet analysis LDL particle # at 786   Nodular basal cell carcinoma (BCC) 10/04/2020   Right Anterior Neck   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)    Renal calculus, right    Renal cyst    S/P coronary artery stent placement    1997  --  PCI  and stenting to LAD   SCC (squamous cell carcinoma) 2011   left neck tx with bx   SCC (squamous cell carcinoma) 2018   left forearm tx with bx   SCC (squamous cell carcinoma) 2018   right outer eyebore cx3 [id number] SCC (squamous cell carcinoma) 2018   right post neck cx3 [id number] (squamous cell carcinoma) of skin 10/04/2020   Right Malar Cheek (in situ)   SCCA (squamous cell carcinoma) of skin 10/04/2020   Right Sideburn (well diff)   Squamous cell carcinoma of skin  11/25/2019   IN SITU- left 2nd finger metacarpophalangeal joint (txpbx)   Wears glasses    Wears hearing aid    bilateral    Past Surgical History:  Procedure Laterality Date   BALLOON DILATION  01/24/2012   Procedure: BALLOON DILATION;  Surgeon: NajeRogene Houston;  [facility];  Service: Endoscopy;  Laterality: N/A;   CARDIOVASCULAR STRESS TEST  10-24-2011   dr kellClaiborne Billingsow risk nuclear study w/ mild to moderate perfustion defect due to infarct/scar w/  mild perinfarct ischemia in the apical septal and apical regions/  normal LV function and mild hypokinesis in the apical and apical septal (no sign. change compared to previous study()   CATARACT EXTRACTION W/ INTRAOCULAR LENS  IMPLANT, BILATERAL  2015   COLONOSCOPY N/A 04/10/2017   Procedure: COLONOSCOPY;  Surgeon: Rogene Houston, MD;  [facility]: [facility];  Service: Endoscopy;  Laterality: N/A;  1115   COLONOSCOPY WITH ESOPHAGOGASTRODUODENOSCOPY (EGD)  01/24/2012   Procedure: COLONOSCOPY WITH ESOPHAGOGASTRODUODENOSCOPY (EGD);  Surgeon: Rogene Houston, MD;  [facility]: [facility];  Service: Endoscopy;  Laterality: N/A;  955   CORONARY ANGIOPLASTY  06/08/1996   dr Shelva Majestic   Balloon angioplasty to  pLAD for in-stent restenosis, no change mLAD 30-50%/  narrowing  midRCA 40-50%/  mild ectasia of LCX w/ 30-50%  proximal and marginal narrowing/  mild LV dysfunction w/ anterolateral hypocontractility   CORONARY ANGIOPLASTY WITH STENT PLACEMENT  09-15-1995    dr Claiborne Billings   PTCA/ Stenting (palmaz-schatz)  to proximal LAD   CYSTOSCOPY WITH STENT PLACEMENT Right 01/01/2016   Procedure: CYSTOSCOPY WITH STENT PLACEMENT;  Surgeon: Franchot Gallo, MD;  Location[facility];  Service: Urology;  Laterality: Right;   CYSTOSCOPY/RETROGRADE/URETEROSCOPY/STONE EXTRACTION WITH BASKET Right 01/01/2016   Procedure: CYSTOSCOPY/RETROGRADE/URETEROSCOPY;  Surgeon: Franchot Gallo, MD;  Location[facility];   Service: Urology;  Laterality: Right;   EXTRACORPOREAL SHOCK WAVE LITHOTRIPSY  10-30-2015;   11-25-2011   HOLMIUM LASER APPLICATION Right 92/92/4462   Procedure: HOLMIUM LASER APPLICATION;  Surgeon: Franchot Gallo, MD;  Location: [facility];  Service: Urology;  Laterality: Right;   LUMBAR MICRODISCECTOMY  1998   L5 -- S1   MALONEY DILATION  01/24/2012   Procedure: MALONEY DILATION;  Surgeon: Rogene Houston, MD;  [facility];  Service: Endoscopy;  Laterality: N/A;   POLYPECTOMY  04/10/2017   Procedure: POLYPECTOMY;  Surgeon: Rogene Houston, MD;  [facility];  Service: Endoscopy;;   SAVORY DILATION  01/24/2012   Procedure: SAVORY DILATION;  Surgeon: Rogene Houston, MD;  [facility];  Service: Endoscopy;  Laterality: N/A;   TRANSTHORACIC ECHOCARDIOGRAM  01/25/2014   mild concentric LVH, ef 86-38%, grade 1 diastolic dysfunction/  mild PR    No Known Allergies  Current Outpatient Medications  Medication Sig Dispense Refill   aspirin 81 MG tablet Take 1 tablet (81 mg total) by mouth daily. 30 tablet    carvedilol (COREG) 3.125 MG tablet Take 1 tablet by mouth two  times daily 180 tablet 1   fluorouracil (EFUDEX) 5 % cream Apply topically daily. apply qhs x 2 weeds 40 g 0   HUMULIN 70/30 (70-30) 100 UNIT/ML injection Inject into the skin.     Insulin Lispro Prot & Lispro (HUMALOG MIX 75/25 KWIKPEN [place]) Inject 30-40 Units into the skin See admin instructions. Take 40 units in the morning and 30 units at lunch and 40 units before meals at night     lisinopril (PRINIVIL,ZESTRIL) 5 MG tablet Take 5 mg by mouth daily.      ONETOUCH VERIO test strip 1 each 3 (three) times daily.     ranitidine (ZANTAC) 150 MG tablet Take 150 mg by mouth daily.      rosuvastatin (CRESTOR) 40 MG tablet Take 40 mg by mouth every evening.      No current facility-administered medications for this visit.    Social History   Socioeconomic History   Marital  status: Married    Spouse name: Blanch Media   Number of  children: 2   Years of education: Not on file   Highest education level: Not on file  Occupational History   Occupation: retired Engineer, manufacturing systems  Tobacco Use   Smoking status: Former    Types: Pipe, Cigars    Quit date: 12/27/2011    Years since quitting: 9.0   Smokeless tobacco: Never  Vaping Use   Vaping Use: Never used  Substance and Sexual Activity   Alcohol use: No    Alcohol/week: 0.0 standard drinks   Drug use: No   Sexual activity: Not on file  Other Topics Concern   Not on file  Social History Narrative   Not on file   Social Determinants of Health   Financial Resource Strain: Not on file  Food Insecurity: Not on file  Transportation Needs: Not on file  Physical Activity: Not on file  Stress: Not on file  Social Connections: Not on file  Intimate Partner Violence: Not on file    Family History  Problem Relation Age of Onset   Heart disease Mother 79   Heart disease Father 31   Healthy Son    Healthy Son    Social history is notable that he is married has 2 children. He does not routinely exercise. There is no tobacco or alcohol use.   ROS General: Negative; No fevers, chills, or night sweats;  HEENT: Negative; No changes in vision or hearing, sinus congestion, difficulty swallowing Pulmonary: Negative; No cough, wheezing, shortness of breath, hemoptysis Cardiovascular: Negative; No chest pain, presyncope, syncope, palpitations GI: positive for GERD; No nausea, vomiting, diarrhea, or abdominal pain GU: Positive for kidney stones; No dysuria, hematuria, or difficulty voiding Musculoskeletal: Negative; no myalgias, joint pain, or weakness Hematologic/Oncology: Negative; no easy bruising, bleeding Endocrine: positive for poorly controlled diabetes mellitus.  No thyroid abnormalities. Neuro: Negative; no changes in balance, headaches Skin: Negative; No rashes or skin lesions Psychiatric: Negative; No  behavioral problems, depression Sleep: Negative; No snoring, daytime sleepiness, hypersomnolence, bruxism, restless legs, hypnogognic hallucinations, no cataplexy Other comprehensive 14 point system review is negative.   PE BP 135/61   Pulse (!) 56   Ht '5\' 6"'  (1.676 m)   Wt 213 lb 6.4 oz (96.8 kg)   SpO2 99%   BMI 34.44 kg/m    Repeat blood pressure by me was 132/64  Wt Readings from Last 3 Encounters:  01/12/21 213 lb 6.4 oz (96.8 kg)  12/10/19 210 lb 12.8 oz (95.6 kg)  11/12/18 203 lb (92.1 kg)   General: Alert, oriented, no distress.  Skin: normal turgor, no rashes, warm and dry HEENT: Normocephalic, atraumatic. Pupils equal round and reactive to light; sclera anicteric; extraocular muscles intact;  Nose without nasal septal hypertrophy Mouth/Parynx benign; Mallinpatti scale 3 Neck: No JVD, no carotid bruits; normal carotid upstroke Lungs: clear to ausculatation and percussion; no wheezing or rales Chest wall: without tenderness to palpitation Heart: PMI not displaced, RRR, s1 s2 normal, 1/6 systolic murmur, no diastolic murmur, no rubs, gallops, thrills, or heaves Abdomen: soft, nontender; no hepatosplenomehaly, BS+; abdominal aorta nontender and not dilated by palpation. Back: no CVA tenderness Pulses 2+ Musculoskeletal: full range of motion, normal strength, no joint deformities Extremities: no clubbing cyanosis or edema, Homan's sign negative  Neurologic: grossly nonfocal; Cranial nerves grossly wnl Psychologic: Normal mood and affect    January 12, 2021 ECG (independently read by me):  Sinus bradycardia at 56, RBBB, LAHB, no ectopy, normal intervals  December 10, 2019 ECG (independently read by me): Normal sinus  rhythm at 62 bpm.  Right bundle branch block, left anterior hemiblock.  QTc interval 456 ms.  August 2020 ECG (independently read by me): Sinus bradycardia 54 bpm with mild sinus arrhythmia, right bundle branch block with repolarization changes, left  anterior hemiblock.  April 2019 ECG (independently read by me): Sinus bradycardia 56 bpm.  Right bundle branch block with repolarization changes.  Left anterior hemiblock.  Normal intervals.  No ectopy.  October 2018 ECG (independently read by me): normal sinus rhythm at 66 bpm.  Right bundle-branch block with repolarization changes.  Left anterior hemiblock.  Normal intervals.  March 2018 ECG (independently read by me): Sinus Bradycardia 56 bpm, right bundle branch block, left anterior hemiblock, LVH.  January 2017 ECG (independently read by me): Normal sinus rhythm at 60 bpm.  Right bundle-branch block and left anterior hemiblock consistent with bifascicular block.  QTc interval 432 ms.  February 2016 ECG (independently read by me): Sinus bradycardia 56 bpm.  Right bundle-branch block with repolarization changes.  Septal Q wave in V1.  November 2015 ECG (independently read by me): Marked sinus bradycardia at 44 bpm.  Right bundle branch block.  Probable left anterior fascicular block. Small septal Q waves  October 2014 ECG: Sinus bradycardia with right bundle branch block. Small Q waves in leads V1 and V2 concord with septal infarct that is old. Nonspecific T changes laterally.  LABS:  Viewed recent November 2018 laboratory by Dr. Nadara Mustard.  Total cholesterol 118, HDL 36, LDL 61, triglycerides 105.  Globin 17.3.  Creatinine 1.87. HbA1c 8.3  BMP Latest Ref Rng & Units 01/12/2021 12/22/2019 12/10/2019  Glucose 70 - 99 mg/dL 197(H) 125(H) 260(H)  BUN 8 - 27 mg/dL '14 18 19  ' Creatinine 0.76 - 1.27 mg/dL 1.60(H) 1.88(H) 1.87(H)  BUN/Creat Ratio 10 - 24 9(L) 10 10  Sodium 134 - 144 mmol/L 141 141 140  Potassium 3.5 - 5.2 mmol/L 4.8 5.1 5.5(H)  Chloride 96 - 106 mmol/L 106 107(H) 104  CO2 20 - 29 mmol/L '21 23 24  ' Calcium 8.6 - 10.2 mg/dL 9.2 9.6 9.9   Hepatic Function Latest Ref Rng & Units 01/12/2021 12/10/2019 01/09/2016  Total Protein 6.0 - 8.5 g/dL 5.9(L) 6.3 5.6(L)  Albumin 3.7 - 4.7 g/dL  4.1 4.2 2.4(L)  AST 0 - 40 IU/L 18 16 79(H)  ALT 0 - 44 IU/L 17 23 123(H)  Alk Phosphatase 44 - 121 IU/L 61 65 67  Total Bilirubin 0.0 - 1.2 mg/dL 0.4 0.4 0.9   CBC Latest Ref Rng & Units 01/12/2021 12/10/2019 01/08/2016  WBC 3.4 - 10.8 x10E3/uL 7.6 6.1 10.4  Hemoglobin 13.0 - 17.7 g/dL 12.9(L) 14.9 15.8  Hematocrit 37.5 - 51.0 % 42.0 46.3 44.8  Platelets 150 - 450 x10E3/uL 188 160 173   Lab Results  Component Value Date   MCV 77 (L) 01/12/2021   MCV 82 12/10/2019   MCV 84.2 01/08/2016   Lab Results  Component Value Date   TSH 1.870 01/12/2021   Lab Results  Component Value Date   HGBA1C 8.7 (H) 01/12/2021   Lipid Panel     Component Value Date/Time   CHOL 108 01/12/2021 1009   TRIG 124 01/12/2021 1009   HDL 40 01/12/2021 1009   CHOLHDL 2.7 01/12/2021 1009   CHOLHDL 3.1 11/30/2013 0847   VLDL 27 11/30/2013 0847   LDLCALC 46 01/12/2021 1009     RADIOLOGY: No results found.  IMPRESSION:  1. Coronary artery disease due to lipid rich plaque  2. Hyperlipidemia LDL goal <70   3. Type 2 diabetes mellitus with complication, with long-term current use of insulin (HCC)   4. Stage 3b chronic kidney disease ([place])    ASSESSMENT AND PLAN: Mr. Maurice Ross is an 80 year old Caucasian male who suffered an anterior wall myocardial infarction in 1997 and underwent initial intervention to his LAD and required repeat intervention secondary to restenosis in 1998. He has concomitant CAD and has been on medical therapy. His nuclear perfusion study in August 2013 was unchanged from 2011 and only showed a very small region of abnormality in the apical septal region. His last echo Doppler study in October 2017 showed an EF of 50-55% with mild LVH and mild hypokinesis of the mid to distal anteroseptal wall with grade 1 diastolic dysfunction.  There was mild LA enlargement, mild mitral annular calcification, and trivial tricuspid regurgitation with normal pulmonary pressure.  Presently, his  blood pressure today remains stable on his regimen consisting of carvedilol 3.125 mg twice a day and lisinopril 5 mg daily.  He is on rosuvastatin 40 mg for hyperlipidemia.  LDL cholesterol in December 2021 was excellent at 53.  He is diabetic on insulin and is followed by Dr. Michiel Sites.  He has stage IIIb CKD and his creatinine in October 2021 was 1.88 and more recently in December 2021 was 1.93.  He has not had recent laboratory.  I am recommending fasting laboratory be obtained to include a comprehensive metabolic panel, hemoglobin A1c, CBC, TSH, and lipid studies.  He will be following up with his endocrinologist in the 3 to 4 months.  As long as he continues to be stable from a cardiac standpoint I will see him in 1 year for reevaluation or sooner as needed.   Troy Sine, MD, [facility]  01/14/2021 5:45 PM

## 2021-01-13 LAB — CBC
Hematocrit: 42 % (ref 37.5–51.0)
Hemoglobin: 12.9 g/dL — ABNORMAL LOW (ref 13.0–17.7)
MCH: 23.7 pg — ABNORMAL LOW (ref 26.6–33.0)
MCHC: 30.7 g/dL — ABNORMAL LOW (ref 31.5–35.7)
MCV: 77 fL — ABNORMAL LOW (ref 79–97)
Platelets: 188 10*3/uL (ref 150–450)
RBC: 5.44 x10E6/uL (ref 4.14–5.80)
RDW: 15.6 % — ABNORMAL HIGH (ref 11.6–15.4)
WBC: 7.6 10*3/uL (ref 3.4–10.8)

## 2021-01-13 LAB — COMPREHENSIVE METABOLIC PANEL
ALT: 17 IU/L (ref 0–44)
AST: 18 IU/L (ref 0–40)
Albumin/Globulin Ratio: 2.3 — ABNORMAL HIGH (ref 1.2–2.2)
Albumin: 4.1 g/dL (ref 3.7–4.7)
Alkaline Phosphatase: 61 IU/L (ref 44–121)
BUN/Creatinine Ratio: 9 — ABNORMAL LOW (ref 10–24)
BUN: 14 mg/dL (ref 8–27)
Bilirubin Total: 0.4 mg/dL (ref 0.0–1.2)
CO2: 21 mmol/L (ref 20–29)
Calcium: 9.2 mg/dL (ref 8.6–10.2)
Chloride: 106 mmol/L (ref 96–106)
Creatinine, Ser: 1.6 mg/dL — ABNORMAL HIGH (ref 0.76–1.27)
Globulin, Total: 1.8 g/dL (ref 1.5–4.5)
Glucose: 197 mg/dL — ABNORMAL HIGH (ref 70–99)
Potassium: 4.8 mmol/L (ref 3.5–5.2)
Sodium: 141 mmol/L (ref 134–144)
Total Protein: 5.9 g/dL — ABNORMAL LOW (ref 6.0–8.5)
eGFR: 43 mL/min/{1.73_m2} — ABNORMAL LOW (ref >59)

## 2021-01-13 LAB — LIPID PANEL
Chol/HDL Ratio: 2.7 ratio (ref 0.0–5.0)
Cholesterol, Total: 108 mg/dL (ref 100–199)
HDL: 40 mg/dL (ref >39)
LDL Chol Calc (NIH): 46 mg/dL (ref 0–99)
Triglycerides: 124 mg/dL (ref 0–149)
VLDL Cholesterol Cal: 22 mg/dL (ref 5–40)

## 2021-01-13 LAB — TSH: TSH: 1.87 u[IU]/mL (ref 0.450–4.500)

## 2021-01-13 LAB — HEMOGLOBIN A1C
Est. average glucose Bld gHb Est-mCnc: 203 mg/dL
Hgb A1c MFr Bld: 8.7 % — ABNORMAL HIGH (ref 4.8–5.6)

## 2021-01-14 ENCOUNTER — Encounter: Payer: Self-pay | Admitting: Cardiovascular Disease

## 2021-04-10 ENCOUNTER — Ambulatory Visit: Payer: Medicare Other | Admitting: Physician Assistant

## 2021-04-10 ENCOUNTER — Encounter: Payer: Self-pay | Admitting: Physician Assistant

## 2021-04-10 ENCOUNTER — Other Ambulatory Visit: Payer: Self-pay

## 2021-04-10 DIAGNOSIS — L820 Inflamed seborrheic keratosis: Secondary | ICD-10-CM

## 2021-04-10 DIAGNOSIS — L570 Actinic keratosis: Secondary | ICD-10-CM | POA: Diagnosis not present

## 2021-04-10 DIAGNOSIS — D485 Neoplasm of uncertain behavior of skin: Secondary | ICD-10-CM

## 2021-04-11 ENCOUNTER — Encounter: Payer: Self-pay | Admitting: Physician Assistant

## 2021-04-11 NOTE — Progress Notes (Signed)
° °  Follow-Up Visit   Subjective  Maurice Ross is a 81 y.o. male who presents for the following: Follow-up (No new concerns- Personal history of non mole skin cancers and atypical nevi, but no melanoma. ).   The following portions of the chart were reviewed this encounter and updated as appropriate:  Tobacco   Allergies   Meds   Problems   Med Hx   Surg Hx   Fam Hx       Objective  Well appearing patient in no apparent distress; mood and affect are within normal limits.  A focused examination was performed including face. Relevant physical exam findings are noted in the Assessment and Plan.  Left Temple Erythematous patches with gritty scale.  Chest - Medial (Center) Hyperkeratotic scale with pink base    Assessment & Plan  AK (actinic keratosis) Left Temple  Destruction of lesion - Left Temple Complexity: simple   Destruction method: cryotherapy   Informed consent: discussed and consent obtained   Timeout:  patient name, date of birth, surgical site, and procedure verified Lesion destroyed using liquid nitrogen: Yes   Cryotherapy cycles:  3 Outcome: patient tolerated procedure well with no complications    Related Medications fluorouracil (EFUDEX) 5 % cream Apply topically daily. apply qhs x 2 weeds  Neoplasm of uncertain behavior of skin Chest - Medial (Center)  Skin / nail biopsy Type of biopsy: tangential   Informed consent: discussed and consent obtained   Timeout: patient name, date of birth, surgical site, and procedure verified   Procedure prep:  Patient was prepped and draped in usual sterile fashion (Non sterile) Prep type:  Chlorhexidine Anesthesia: the lesion was anesthetized in a standard fashion   Anesthetic:  1% lidocaine w/ epinephrine 1-100,000 local infiltration Instrument used: flexible razor blade   Outcome: patient tolerated procedure well   Post-procedure details: wound care instructions given    Specimen 1 - Surgical pathology Differential  Diagnosis: r/o sk  Check Margins: No    I, Vansh Reckart, PA-C, have reviewed all documentation's for this visit.  The documentation on 04/11/21 for the exam, diagnosis, procedures and orders are all accurate and complete.

## 2021-04-17 ENCOUNTER — Other Ambulatory Visit (HOSPITAL_COMMUNITY): Payer: Self-pay | Admitting: Nephrology

## 2021-04-17 DIAGNOSIS — E1122 Type 2 diabetes mellitus with diabetic chronic kidney disease: Secondary | ICD-10-CM

## 2021-04-17 DIAGNOSIS — E1129 Type 2 diabetes mellitus with other diabetic kidney complication: Secondary | ICD-10-CM

## 2021-04-17 DIAGNOSIS — N182 Chronic kidney disease, stage 2 (mild): Secondary | ICD-10-CM

## 2021-04-17 DIAGNOSIS — D638 Anemia in other chronic diseases classified elsewhere: Secondary | ICD-10-CM

## 2021-04-17 DIAGNOSIS — I5032 Chronic diastolic (congestive) heart failure: Secondary | ICD-10-CM

## 2021-04-17 DIAGNOSIS — I129 Hypertensive chronic kidney disease with stage 1 through stage 4 chronic kidney disease, or unspecified chronic kidney disease: Secondary | ICD-10-CM

## 2021-04-17 DIAGNOSIS — N200 Calculus of kidney: Secondary | ICD-10-CM

## 2021-04-26 ENCOUNTER — Ambulatory Visit (HOSPITAL_COMMUNITY)
Admission: RE | Admit: 2021-04-26 | Discharge: 2021-04-26 | Disposition: A | Discharge status: Home / Self Care | Payer: Medicare Other | Source: Ambulatory Visit | Attending: Nephrology | Admitting: Nephrology

## 2021-04-26 ENCOUNTER — Other Ambulatory Visit: Payer: Self-pay

## 2021-04-26 DIAGNOSIS — I129 Hypertensive chronic kidney disease with stage 1 through stage 4 chronic kidney disease, or unspecified chronic kidney disease: Secondary | ICD-10-CM | POA: Insufficient documentation

## 2021-04-26 DIAGNOSIS — N182 Chronic kidney disease, stage 2 (mild): Secondary | ICD-10-CM | POA: Insufficient documentation

## 2021-04-26 DIAGNOSIS — E1122 Type 2 diabetes mellitus with diabetic chronic kidney disease: Secondary | ICD-10-CM | POA: Diagnosis not present

## 2021-04-26 DIAGNOSIS — I5032 Chronic diastolic (congestive) heart failure: Secondary | ICD-10-CM | POA: Insufficient documentation

## 2021-04-26 DIAGNOSIS — R809 Proteinuria, unspecified: Secondary | ICD-10-CM | POA: Insufficient documentation

## 2021-04-26 DIAGNOSIS — E1129 Type 2 diabetes mellitus with other diabetic kidney complication: Secondary | ICD-10-CM | POA: Diagnosis present

## 2021-04-26 DIAGNOSIS — N200 Calculus of kidney: Secondary | ICD-10-CM | POA: Diagnosis present

## 2021-04-26 DIAGNOSIS — D638 Anemia in other chronic diseases classified elsewhere: Secondary | ICD-10-CM | POA: Diagnosis present

## 2021-09-21 ENCOUNTER — Encounter (INDEPENDENT_AMBULATORY_CARE_PROVIDER_SITE_OTHER): Payer: Self-pay | Admitting: *Deleted

## 2021-11-22 ENCOUNTER — Ambulatory Visit (INDEPENDENT_AMBULATORY_CARE_PROVIDER_SITE_OTHER): Payer: Medicare Other | Admitting: Gastroenterology

## 2021-11-22 ENCOUNTER — Encounter (INDEPENDENT_AMBULATORY_CARE_PROVIDER_SITE_OTHER): Payer: Self-pay | Admitting: Gastroenterology

## 2021-11-22 VITALS — BP 133/79 | HR 63 | Temp 98.1°F | Ht 66.0 in | Wt 212.2 lb

## 2021-11-22 DIAGNOSIS — D509 Iron deficiency anemia, unspecified: Secondary | ICD-10-CM | POA: Diagnosis not present

## 2021-11-22 NOTE — Progress Notes (Signed)
Referring Provider: Rory Percy, MD Primary Care Physician:  Rory Percy, MD Primary GI Physician: new, previously [facility]  Chief Complaint  Patient presents with   Anemia    New patient. Referred for iron def anemia. Would like to schedule colonoscopy.    HPI:   Maurice Ross is a 81 y.o. male with past medical history of BCC, CKD stage III, CAD, DM, type 2, GERD, anterior wall MI, RBBB, SCC  Patient presenting today for as a new patient for IDA, referred by Dr. Theador Hawthorne.  Hemoglobin 12.9 in January 2023 with iron 38, ferritin 6, TIBC 368, Iron sat 10%, patient started on PO iron, repeat labs in June with hgb of 17.4, iron 84, ferritin 38, TIBC 297 and iron sat 28. PO iron was stopped at that time. Most recent labs done 11/06/20 with hgb 15.3, MCV 87.  Present:  Patient states he went to give blood earlier this year, at that time they told him his hgb was 12.7 and had to be a minimum of 13, he saw Dr. Theador Hawthorne in the meantime and was told to start OTC iron supplement, he had repeat labs in 6 weeks with hgb 22.4, he saw Dr. Theador Hawthorne again and was told to stop iron supplements, he last took these about 1 month ago. He denies any history of IDA. States that he felt okay, denies sob, dizziness or  fatigue. Denies rectal bleeding or melena. No changes in bowel habits. No weight loss or changes in appetite. Notes he was taking iron once per day at that time, unsure of the dosage of this. He denies nausea, vomiting, early satiety. No NSAID use, no GERD symptoms.   States he is being evaluated for a new medication that is part of a study, he had some testing recently and does not know the results. Per patient, Medication is to help kidney function and prevent MI and stroke.   He has labs coming up on annual wellness check in November.   NSAID use:no nsaids  Social hx: no etoh or tobacco   Last Colonoscopy:04/10/17- Two small polyps in the sigmoid colon and in the ascending colon. (Two small  tublar adenomas)  - Diverticulosis in the sigmoid colon. - External and internal hemorrhoids. - Anal papilla(e) were hypertrophied. Last Endoscopy:2013 Erosive antral gastritis otherwise normal EGD. Esophagus dilated  by passing 54 French Maloney dilator no mucosal disruption noted. Mild sigmoid colon diverticulosis. Three small polyps ablated via cold biopsy from ascending colon and submitted together.  Recommendations:  Consider colonoscopy in 2024 depending on health   Past Medical History:  Diagnosis Date   Atypical mole 05/2015   right supra pubic mild atypia   Basal cell carcinoma 2000   right jawline curet 3 exc   Basal cell carcinoma 2017   left cheek cx3 45f   Basal cell carcinoma 2019   left clavical cx3 512f  CKD (chronic kidney disease), stage III (HCC)    Coronary artery disease CARDIOLOGIST-  DR THShelva Majestic Coronary stent 1997, Last cath 1998, Last Nuc 10/24/2011-no change from previous, Last Echo 2011 EF 45%   Diabetes mellitus type 2, uncontrolled    per pt last A1c 8.8 in Aug 2017   GERD (gastroesophageal reflux disease)    History of acute anterior wall MI    1997   History of adenomatous polyp of colon    adenomatous 2004;   tubular adenoma 2013   History of basal cell carcinoma excision  05-24-2015  left cheek   History of esophageal dilatation    Hyperlipidemia    lipomet analysis LDL particle # at 786   Nodular basal cell carcinoma (BCC) 10/04/2020   Right Anterior Neck   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)    Renal calculus, right    Renal cyst    S/P coronary artery stent placement    1997  --  PCI  and stenting to LAD   SCC (squamous cell carcinoma) 2011   left neck tx with bx   SCC (squamous cell carcinoma) 2018   left forearm tx with bx   SCC (squamous cell carcinoma) 2018   right outer eyebore cx3 36f   SCC (squamous cell carcinoma) 2018   right post neck cx3 551f  SCCA (squamous cell carcinoma) of  skin 10/04/2020   Right Malar Cheek (in situ)   SCCA (squamous cell carcinoma) of skin 10/04/2020   Right Sideburn (well diff)   Squamous cell carcinoma of skin 11/25/2019   IN SITU- left 2nd finger metacarpophalangeal joint (txpbx)   Wears glasses    Wears hearing aid    bilateral    Past Surgical History:  Procedure Laterality Date   BALLOON DILATION  01/24/2012   Procedure: BALLOON DILATION;  Surgeon: NaRogene HoustonMD;  [facility];  Service: Endoscopy;  Laterality: N/A;   CARDIOVASCULAR STRESS TEST  10-24-2011   dr keClaiborne Billings Low risk nuclear study w/ mild to moderate perfustion defect due to infarct/scar w/  mild perinfarct ischemia in the apical septal and apical regions/  normal LV function and mild hypokinesis in the apical and apical septal (no sign. change compared to previous study()   CATARACT EXTRACTION W/ INTRAOCULAR LENS  IMPLANT, BILATERAL  2015   COLONOSCOPY N/A 04/10/2017   Procedure: COLONOSCOPY;  Surgeon: ReRogene HoustonMD;  [facility];  Service: Endoscopy;  Laterality: N/A;  1115   COLONOSCOPY WITH ESOPHAGOGASTRODUODENOSCOPY (EGD)  01/24/2012   Procedure: COLONOSCOPY WITH ESOPHAGOGASTRODUODENOSCOPY (EGD);  Surgeon: NaRogene HoustonMD;  [facility];  Service: Endoscopy;  Laterality: N/A;  955   CORONARY ANGIOPLASTY  06/08/1996   dr thShelva Majestic Balloon angioplasty to  pLAD for in-stent restenosis, no change mLAD 30-50%/  narrowing  midRCA 40-50%/  mild ectasia of LCX w/ 30-50%  proximal and marginal narrowing/  mild LV dysfunction w/ anterolateral hypocontractility   CORONARY ANGIOPLASTY WITH STENT PLACEMENT  09-15-1995    dr keClaiborne Billings PTCA/ Stenting (palmaz-schatz)  to proximal LAD   CYSTOSCOPY WITH STENT PLACEMENT Right 01/01/2016   Procedure: CYSTOSCOPY WITH STENT PLACEMENT;  Surgeon: StFranchot GalloMD;  [facility];  Laterality: Right;   CYSTOSCOPY/RETROGRADE/URETEROSCOPY/STONE  EXTRACTION WITH BASKET Right 01/01/2016   Procedure: CYSTOSCOPY/RETROGRADE/URETEROSCOPY;  Surgeon: StFranchot GalloMD;  [facility];  Laterality: Right;   EXTRACORPOREAL SHOCK WAVE LITHOTRIPSY  10-30-2015;   11-25-2011   HOLMIUM LASER APPLICATION Right 1016/12/9602 Procedure: HOLMIUM LASER APPLICATION;  Surgeon: StFranchot GalloMD;  [facility];  Laterality: Right;   LUMBAR MICRODISCECTOMY  1998   L5 -- S1   MALONEY DILATION  01/24/2012   Procedure: MALONEY DILATION;  Surgeon: NaRogene HoustonMD;  [facility];  Service: Endoscopy;  Laterality: N/A;   POLYPECTOMY  04/10/2017   Procedure: POLYPECTOMY;  Surgeon: ReRogene HoustonMD;  [facility];  Service: Endoscopy;;   SAVORY DILATION  01/24/2012   Procedure: SAVORY DILATION;  Surgeon: Rogene Houston, MD;  [facility];  Service: Endoscopy;  Laterality: N/A;   TRANSTHORACIC ECHOCARDIOGRAM  01/25/2014   mild concentric LVH, ef 37-85%, grade 1 diastolic dysfunction/  mild PR    Current Outpatient Medications  Medication Sig Dispense Refill   aspirin 81 MG tablet Take 1 tablet (81 mg total) by mouth daily. 30 tablet    carvedilol (COREG) 3.125 MG tablet Take 1 tablet by mouth two  times daily 180 tablet 1   fluorouracil (EFUDEX) 5 % cream Apply topically daily. apply qhs x 2 weeds 40 g 0   HUMULIN 70/30 (70-30) 100 UNIT/ML injection Inject into the skin.     lisinopril (PRINIVIL,ZESTRIL) 5 MG tablet Take 5 mg by mouth daily.      ONETOUCH VERIO test strip 1 each 3 (three) times daily.     ranitidine (ZANTAC) 150 MG tablet Take 150 mg by mouth daily.      rosuvastatin (CRESTOR) 40 MG tablet Take 40 mg by mouth every evening.      No current facility-administered medications for this visit.    Allergies as of 11/22/2021 - Review Complete 11/22/2021  Allergen Reaction Noted   Other  11/22/2021    Family History   Problem Relation Age of Onset   Heart disease Mother 38   Heart disease Father 88   Healthy Son    Healthy Son     Social History   Socioeconomic History   Marital status: Married    Spouse name: Blanch Media   Number of children: 2   Years of education: Not on file   Highest education level: Not on file  Occupational History   Occupation: retired Engineer, manufacturing systems  Tobacco Use   Smoking status: Former    Types: Pipe, Cigars    Quit date: 12/27/2011    Years since quitting: 9.9   Smokeless tobacco: Never  Vaping Use   Vaping Use: Never used  Substance and Sexual Activity   Alcohol use: No    Alcohol/week: 0.0 standard drinks of alcohol   Drug use: No   Sexual activity: Not on file  Other Topics Concern   Not on file  Social History Narrative   Not on file   Social Determinants of Health   Financial Resource Strain: Not on file  Food Insecurity: Not on file  Transportation Needs: Not on file  Physical Activity: Not on file  Stress: Not on file  Social Connections: Not on file   Review of systems General: negative for malaise, night sweats, fever, chills, weight loss Neck: Negative for lumps, goiter, pain and significant neck swelling Resp: Negative for cough, wheezing, dyspnea at rest CV: Negative for chest pain, leg swelling, palpitations, orthopnea GI: denies melena, hematochezia, nausea, vomiting, diarrhea, constipation, dysphagia, odyonophagia, early satiety or unintentional weight loss.  MSK: Negative for joint pain or swelling, back pain, and muscle pain. Derm: Negative for itching or rash Psych: Denies depression, anxiety, memory loss, confusion. No homicidal or suicidal ideation.  Heme: Negative for prolonged bleeding, bruising easily, and swollen nodes. Endocrine: Negative for cold or heat intolerance, polyuria, polydipsia and goiter. Neuro: negative for tremor, gait imbalance, syncope and seizures. The remainder of the review of systems is  noncontributory.  Physical Exam: BP 133/79 (BP Location: Left Arm, Patient Position: Sitting, Cuff Size: Large)   Pulse 63   Temp 98.1 F (36.7  C) (Oral)   Ht '5\' 6"'$  (1.676 m)   Wt 212 lb 3.2 oz (96.3 kg)   BMI 34.25 kg/m  General:   Alert and oriented. No distress noted. Pleasant and cooperative.  Head:  Normocephalic and atraumatic. Eyes:  Conjuctiva clear without scleral icterus. Mouth:  Oral mucosa pink and moist. Good dentition. No lesions. Heart: Normal rate and rhythm, s1 and s2 heart sounds present.  Lungs: Clear lung sounds in all lobes. Respirations equal and unlabored. Abdomen:  +BS, soft, non-tender and non-distended. No rebound or guarding. No HSM or masses noted. Derm: No palmar erythema or jaundice Msk:  Symmetrical without gross deformities. Normal posture. Extremities:  Without edema. Neurologic:  Alert and  oriented x4 Psych:  Alert and cooperative. Normal mood and affect.  Invalid input(s): "6 MONTHS"   ASSESSMENT: DAMIR LEUNG is a 81 y.o. male presenting today for iron deficiency anemia.  New onset IDA with Hemoglobin 12.9 in January 2023 with iron 38, ferritin 6, TIBC 368, Iron sat 10%, patient started on PO iron, repeat labs in June with hgb of 17.4, iron 84, ferritin 38, TIBC 297 and iron sat 28. PO iron was stopped at that time. Most recent labs done 11/06/20 with hgb 15.3, MCV 87. He has been off of PO iron for about 1 month. He denies any GI symptoms, changes in bowel habits, rectal bleeding, melena, weight loss, fatigue, dizziness, shortness of breath. I discussed recommendation to proceed with EGD/colonoscopy for further evaluation of IDA. At this time he would prefer to keep an eye on labs, he has upcoming labs with his PCP soon. As he has been off of iron 1 month, we will repeat iron studies in 3-4 weeks to attempt to see his true iron levels after stopping supplemental iron. If iron remains low and/or hgb drops again, strongly recommend we proceed with EGD  and Colonoscopy for further evaluation of IDA. Patient is in agreement with this plan.    PLAN:  Repeat iron studies in 3-4 weeks 2.  Proceed with egd/colon if iron and/or hgb low  All questions were answered, patient verbalized understanding and is in agreement with plan as outlined above.    Follow Up: 3 months   Shalon Councilman L. Alver Sorrow, MSN, APRN, AGNP-C Adult-Gerontology Nurse Practitioner [facility]

## 2021-11-22 NOTE — Patient Instructions (Signed)
As discussed, with low hemoglobin and iron levels (iron deficiency anemia) we usually recommend proceeding with EGD and Colonoscopy, if you wish to hold off on this at this time, we can plan to recheck iron studies in 3-4 weeks once you have been off of the iron for about 2 months to see how these numbers looks, if they are low or your hemoglobin drops again, you have rectal bleeding or black stools, we will need to go ahead and proceed with EGD and Colonoscopy.   Follow up 3 months

## 2021-12-20 LAB — IRON,TIBC AND FERRITIN PANEL
Ferritin: 19 ng/mL — ABNORMAL LOW (ref 30–400)
Iron Saturation: 15 % (ref 15–55)
Iron: 55 ug/dL (ref 38–169)
Total Iron Binding Capacity: 360 ug/dL (ref 250–450)
UIBC: 305 ug/dL (ref 111–343)

## 2022-02-05 ENCOUNTER — Other Ambulatory Visit (INDEPENDENT_AMBULATORY_CARE_PROVIDER_SITE_OTHER): Payer: Self-pay

## 2022-02-05 ENCOUNTER — Encounter (INDEPENDENT_AMBULATORY_CARE_PROVIDER_SITE_OTHER): Payer: Self-pay

## 2022-02-05 DIAGNOSIS — D509 Iron deficiency anemia, unspecified: Secondary | ICD-10-CM

## 2022-02-11 ENCOUNTER — Telehealth (INDEPENDENT_AMBULATORY_CARE_PROVIDER_SITE_OTHER): Payer: Self-pay | Admitting: *Deleted

## 2022-02-16 LAB — HEMOGLOBIN: Hemoglobin: 15.7 g/dL (ref 13.0–17.7)

## 2022-02-16 LAB — IRON,TIBC AND FERRITIN PANEL
Ferritin: 20 ng/mL — ABNORMAL LOW (ref 30–400)
Iron Saturation: 18 % (ref 15–55)
Iron: 61 ug/dL (ref 38–169)
Total Iron Binding Capacity: 337 ug/dL (ref 250–450)
UIBC: 276 ug/dL (ref 111–343)

## 2022-02-20 NOTE — Telephone Encounter (Signed)
error 

## 2022-02-21 ENCOUNTER — Ambulatory Visit (INDEPENDENT_AMBULATORY_CARE_PROVIDER_SITE_OTHER): Payer: Medicare Other | Admitting: Gastroenterology

## 2022-05-01 ENCOUNTER — Encounter: Payer: Self-pay | Admitting: Cardiovascular Disease

## 2022-05-01 ENCOUNTER — Ambulatory Visit: Payer: Medicare Other | Attending: Cardiovascular Disease | Admitting: Cardiovascular Disease

## 2022-05-01 VITALS — BP 138/71 | HR 65 | Ht 67.0 in | Wt 200.2 lb

## 2022-05-01 DIAGNOSIS — E118 Type 2 diabetes mellitus with unspecified complications: Secondary | ICD-10-CM

## 2022-05-01 DIAGNOSIS — Z794 Long term (current) use of insulin: Secondary | ICD-10-CM

## 2022-05-01 DIAGNOSIS — I2583 Coronary atherosclerosis due to lipid rich plaque: Secondary | ICD-10-CM | POA: Diagnosis not present

## 2022-05-01 DIAGNOSIS — E785 Hyperlipidemia, unspecified: Secondary | ICD-10-CM | POA: Diagnosis not present

## 2022-05-01 DIAGNOSIS — I2510 Atherosclerotic heart disease of native coronary artery without angina pectoris: Secondary | ICD-10-CM

## 2022-05-01 DIAGNOSIS — N1832 Chronic kidney disease, stage 3b: Secondary | ICD-10-CM | POA: Diagnosis not present

## 2022-05-01 NOTE — Progress Notes (Signed)
Patient ID: Maurice Ross, male   DOB: 20-Dec-1940, 82 y.o.   MRN: [id number]      Primary MD: Dr. Rory Ross  HPI: Maurice Ross, is a 82 y.o. male who presents for a 16 month follow-up cardiology evaluation.  Maurice Ross has established CAD and in July 1997 suffered an anterior wall myocardial infarction and underwent PTCA/stenting of his LAD. Repeat intervention was done in 1998 due to in-stent restenosis. At that time he also had moderate disease in his circumflex and right coronary artery which medical therapy has been recommended. His last nuclear perfusion study was done in August 2013 which showed old scar in the apical septal and apical region. Ejection fraction was 57%. This study was unchanged from 2 years previously.  He has history of type 2 diabetes mellitus  He, his hemoglobin A1c had risen to 12.6 on 12/23/2013.  Over the past year he had seen Dr. Soyla Ross for endocrinologic evaluation.  Lab work by Dr. Nadara Ross on 01/13/2015 still demonstrated increased hemoglobin A1c at 10.6.  She discontinued his Actos, Glucophage, and Glucotrol, and he is now taking Humalog insulin.  He states that his blood sugars are better.  He is also seeing Dr. Kellie Ross who is following his kidney stones.  An echo Doppler study on 01/25/2014 showed an ejection fraction of 45-50% which was not significantly changed from his prior echo in September 2011.  There was grade 1 diastolic dysfunction.  There was evidence for aortic sclerosis.  There was mild pulmonic regurgitation.  He had increased stress  In 2016 with deaths of 3 of his family members including his father age 22, mother age 83, and mother-in-law on Christmas Eve age 7.    Since July 2017, he has had difficulty with kidney stones initially underwent a trial of lithotripsy but ultimately required surgical removal.  He developed allergic reaction to the anesthesia which led to hospitalization for 2 days in October 2017. An echo Doppler study was done during  that evaluation after he was found to have no troponin elevation.  He was found to have mild LVH with an EF of 50-55%.  There was mild hypokinesis of the mid distal anteroseptal wall, grade 1 diastolic dysfunction, mild LA enlargement, mild mitral annular calcification and he had normal pulmonary pressures.  I saw him in October 2018 and last saw him in April 2019. He denied any anginal symptomatology.  He was on lisinopril 5 mg, carvedilol 3.125 mg twice a day.  He was tolerating rosuvastatin 40 mg without side effects for hyperlipidemia.  He is diabetic on insulin. His hemoglobin A1c was 12.2 and in January 2019 his most recent hemoglobin A1c was 8.3.  He has not had any recurrent kidney stones October 2017.  During his last evaluation his blood pressure was well controlled.  I saw him in August 2020.  His primary MD had retired and he was seeing Maurice Ross.  He establish care with Dr. Michiel Ross of endocrinology for his diabetes mellitus a  In November 2019 LDL cholesterol was 50 total cholesterol 102.  Post recent hemoglobin A1c on August 17, 2018 remained elevated at 8.7.  He has chronic kidney disease laboratory on September 14, 2018 showed a creatinine of 1.94.  He remained active and does yard work.  He denied chest pain PND orthopnea or awareness of palpitations.   I saw him on December 10, 2019.  Since his prior evaluation he continued to do well.  His blood pressure at home  was typically running in the 120s over 70s.  He was without angina, shortness of breath or palpitations and remained asymptomatic.   At times he believes that his balance may not be as good particularly on uneven ground.  He denies any dizziness or syncope.  He has stage IIIb CKD and creatinine in April 2021 had improved to 1.79.  I last saw him on January 12, 2022.  He is followed by Maurice Ross, Maurice Ross for primary care since Dr Maurice Ross had retired.  He remains active and does gardening.  He believes he is sleeping well and typically  goes to bed at 10 PM and wakes up at 7 AM.  He remains active without chest pain, shortness of breath, or palpitations.  He sees Dr. Michiel Ross for his diabetes mellitus.  In October 2021 creatinine had increased to 1.88 and in December 2021 creatinine was 1.93.  Hemoglobin A1c was 7.6 in July 2022.    Since I last saw him, he has continued to feel well.  He remains active.  He denies chest pain or shortness of breath.  He sees Maurice Ross in [place] for his renal disease.  At times he notes some mild shortness of breath when he works excessively in the yard doing gardening.  He is unaware of palpitations, presyncope or syncope.  He continues to be on carvedilol 3.125 mg twice a day and lisinopril 5 mg daily for hypertension.  He is on rosuvastatin 40 mg for hyperlipidemia.  He is on [place] and Humulin insulin for his diabetes mellitus and continues to take aspirin.  He presents for follow-up evaluation.   Past Medical History:  Diagnosis Date   Atypical mole 05/2015   right supra pubic mild atypia   Basal cell carcinoma 2000   right jawline curet 3 exc   Basal cell carcinoma 2017   left cheek cx3 11f   Basal cell carcinoma 2019   left clavical cx3 548f  CKD (chronic kidney disease), stage III (HCC)    Coronary artery disease CARDIOLOGIST-  DR THShelva Majestic Coronary stent 1997, Last cath 1998, Last Nuc 10/24/2011-no change from previous, Last Echo 2011 EF 45%   Diabetes mellitus type 2, uncontrolled    per pt last A1c 8.8 in Aug 2017   GERD (gastroesophageal reflux disease)    History of acute anterior wall MI    1997   History of adenomatous polyp of colon    adenomatous 2004;   tubular adenoma 2013   History of basal cell carcinoma excision    05-24-2015  left cheek   History of esophageal dilatation    Hyperlipidemia    lipomet analysis LDL particle # at 786   Nodular basal cell carcinoma (BCC) 10/04/2020   Right Anterior Neck   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)    Renal calculus, right    Renal cyst    S/P coronary artery stent placement    1997  --  PCI  and stenting to LAD   SCC (squamous cell carcinoma) 2011   left neck tx with bx   SCC (squamous cell carcinoma) 2018   left forearm tx with bx   SCC (squamous cell carcinoma) 2018   right outer eyebore cx3 [id number] SCC (squamous cell carcinoma) 2018   right post neck cx3 [id number] (squamous cell carcinoma) of skin 10/04/2020   Right Malar Cheek (in situ)   SCCA (squamous cell carcinoma) of  skin 10/04/2020   Right Sideburn (well diff)   Squamous cell carcinoma of skin 11/25/2019   IN SITU- left 2nd finger metacarpophalangeal joint (txpbx)   Wears glasses    Wears hearing aid    bilateral    Past Surgical History:  Procedure Laterality Date   BALLOON DILATION  01/24/2012   Procedure: BALLOON DILATION;  Surgeon: Rogene Houston, MD;  [facility];  Service: Endoscopy;  Laterality: N/A;   CARDIOVASCULAR STRESS TEST  10-24-2011   dr Claiborne Billings   Low risk nuclear study w/ mild to moderate perfustion defect due to infarct/scar w/  mild perinfarct ischemia in the apical septal and apical regions/  normal LV function and mild hypokinesis in the apical and apical septal (no sign. change compared to previous study()   CATARACT EXTRACTION W/ INTRAOCULAR LENS  IMPLANT, BILATERAL  2015   COLONOSCOPY N/A 04/10/2017   Procedure: COLONOSCOPY;  Surgeon: Rogene Houston, MD;  [facility];  Service: Endoscopy;  Laterality: N/A;  1115   COLONOSCOPY WITH ESOPHAGOGASTRODUODENOSCOPY (EGD)  01/24/2012   Procedure: COLONOSCOPY WITH ESOPHAGOGASTRODUODENOSCOPY (EGD);  Surgeon: Rogene Houston, MD;  [facility];  Service: Endoscopy;  Laterality: N/A;  955   CORONARY ANGIOPLASTY  06/08/1996   dr Shelva Majestic   Balloon angioplasty to  pLAD for in-stent restenosis, no change mLAD 30-50%/  narrowing  midRCA 40-50%/  mild ectasia of LCX w/ 30-50%  proximal and marginal  narrowing/  mild LV dysfunction w/ anterolateral hypocontractility   CORONARY ANGIOPLASTY WITH STENT PLACEMENT  09-15-1995    dr Claiborne Billings   PTCA/ Stenting (palmaz-schatz)  to proximal LAD   CYSTOSCOPY WITH STENT PLACEMENT Right 01/01/2016   Procedure: CYSTOSCOPY WITH STENT PLACEMENT;  Surgeon: Franchot Gallo, MD;  Location: [facility];  Service: Urology;  Laterality: Right;   CYSTOSCOPY/RETROGRADE/URETEROSCOPY/STONE EXTRACTION WITH BASKET Right 01/01/2016   Procedure: CYSTOSCOPY/RETROGRADE/URETEROSCOPY;  Surgeon: Franchot Gallo, MD;  Location: [facility];  Service: Urology;  Laterality: Right;   EXTRACORPOREAL SHOCK WAVE LITHOTRIPSY  10-30-2015;   11-25-2011   HOLMIUM LASER APPLICATION Right 0000000   Procedure: HOLMIUM LASER APPLICATION;  Surgeon: Franchot Gallo, MD;  Location: [facility];  Service: Urology;  Laterality: Right;   LUMBAR MICRODISCECTOMY  1998   L5 -- S1   MALONEY DILATION  01/24/2012   Procedure: MALONEY DILATION;  Surgeon: Rogene Houston, MD;  [facility];  Service: Endoscopy;  Laterality: N/A;   POLYPECTOMY  04/10/2017   Procedure: POLYPECTOMY;  Surgeon: Rogene Houston, MD;  [facility];  Service: Endoscopy;;   SAVORY DILATION  01/24/2012   Procedure: SAVORY DILATION;  Surgeon: Rogene Houston, MD;  [facility];  Service: Endoscopy;  Laterality: N/A;   TRANSTHORACIC ECHOCARDIOGRAM  01/25/2014   mild concentric LVH, ef [id number], grade 1 diastolic dysfunction/  mild PR    Allergies  Allergen Reactions   Other     General anesthesia - patient reports when he had his kidney surgery around 2018     Current Outpatient Medications  Medication Sig Dispense Refill   aspirin 81 MG tablet Take 1 tablet (81 mg total) by mouth daily. 30 tablet    carvedilol (COREG) 3.125 MG tablet Take 1 tablet by mouth two  times daily 180 tablet 1   dapagliflozin propanediol (FARXIGA) 5 MG TABS tablet  Take 5 mg by mouth daily.     fluorouracil (EFUDEX) 5 % cream Apply topically daily. apply  qhs x 2 weeds 40 g 0   HUMULIN 70/30 (70-30) 100 UNIT/ML injection Inject into the skin.     lisinopril (PRINIVIL,ZESTRIL) 5 MG tablet Take 5 mg by mouth daily.      ONETOUCH VERIO test strip 1 each 3 (three) times daily.     ranitidine (ZANTAC) 150 MG tablet Take 150 mg by mouth daily.      rosuvastatin (CRESTOR) 40 MG tablet Take 40 mg by mouth every evening.      No current facility-administered medications for this visit.    Social History   Socioeconomic History   Marital status: Married    Spouse name: Maurice Ross   Number of children: 2   Years of education: Not on file   Highest education level: Not on file  Occupational History   Occupation: retired Engineer, manufacturing systems  Tobacco Use   Smoking status: Former    Types: Pipe, Cigars    Quit date: 12/27/2011    Years since quitting: 10.3   Smokeless tobacco: Never  Vaping Use   Vaping Use: Never used  Substance and Sexual Activity   Alcohol use: No    Alcohol/week: 0.0 standard drinks of alcohol   Drug use: No   Sexual activity: Not on file  Other Topics Concern   Not on file  Social History Narrative   Not on file   Social Determinants of Health   Financial Resource Strain: Not on file  Food Insecurity: Not on file  Transportation Needs: Not on file  Physical Activity: Not on file  Stress: Not on file  Social Connections: Not on file  Intimate Partner Violence: Not on file    Family History  Problem Relation Age of Onset   Heart disease Mother 19   Heart disease Father 60   Healthy Son    Healthy Son    Social history is notable that he is married has 2 children. He does not routinely exercise. There is no tobacco or alcohol use.   ROS General: Negative; No fevers, chills, or night sweats;  HEENT: Negative; No changes in vision or hearing, sinus congestion, difficulty swallowing Pulmonary: Negative; No cough, wheezing,  shortness of breath, hemoptysis Cardiovascular: See HPI GI: positive for GERD; No nausea, vomiting, diarrhea, or abdominal pain GU: Positive for kidney stones; No dysuria, hematuria, or difficulty voiding Musculoskeletal: Negative; no myalgias, joint pain, or weakness Hematologic/Oncology: Negative; no easy bruising, bleeding Endocrine: positive for poorly controlled diabetes mellitus.  No thyroid abnormalities. Neuro: Negative; no changes in balance, headaches Skin: Negative; No rashes or skin lesions Psychiatric: Negative; No behavioral problems, depression Sleep: Negative; No snoring, daytime sleepiness, hypersomnolence, bruxism, restless legs, hypnogognic hallucinations, no cataplexy Other comprehensive 14 point system review is negative.   PE BP 138/71   Pulse 65   Ht 5' 7"$  (1.702 m)   Wt 200 lb 3.2 oz (90.8 kg)   SpO2 97%   BMI 31.36 kg/m    Repeat blood pressure by me was 120/70.  Since his last office visit with me he has lost 13 pounds from 213.  Wt Readings from Last 3 Encounters:  05/01/22 200 lb 3.2 oz (90.8 kg)  11/22/21 212 lb 3.2 oz (96.3 kg)  01/12/21 213 lb 6.4 oz (96.8 kg)   General: Alert, oriented, no distress.  Skin: normal turgor, no rashes, warm and dry HEENT: Normocephalic, atraumatic. Pupils equal round and reactive to light; sclera anicteric; extraocular muscles intact;  Nose without nasal septal hypertrophy Mouth/Parynx benign; Mallinpatti scale 3  Neck: No JVD, no carotid bruits; normal carotid upstroke Lungs: clear to ausculatation and percussion; no wheezing or rales Chest wall: without tenderness to palpitation Heart: PMI not displaced, RRR, s1 s2 normal, 1/6 systolic murmur, no diastolic murmur, no rubs, gallops, thrills, or heaves Abdomen: Large diastases recti; soft, nontender; no hepatosplenomehaly, BS+; abdominal aorta nontender and not dilated by palpation. Back: no CVA tenderness Pulses 2+ Musculoskeletal: full range of motion, normal  strength, no joint deformities Extremities: no clubbing cyanosis or edema, Homan's sign negative  Neurologic: grossly nonfocal; Cranial nerves grossly wnl Psychologic: Normal mood and affect   May 01, 2022 ECG (independently read by me): Sinus rhythm at 61, PAC, RBBB, LAHB, LVH   January 12, 2021 ECG (independently read by me):  Sinus bradycardia at 56, RBBB, LAHB, no ectopy, normal intervals  December 10, 2019 ECG (independently read by me): Normal sinus rhythm at 62 bpm.  Right bundle branch block, left anterior hemiblock.  QTc interval 456 ms.  August 2020 ECG (independently read by me): Sinus bradycardia 54 bpm with mild sinus arrhythmia, right bundle branch block with repolarization changes, left anterior hemiblock.  April 2019 ECG (independently read by me): Sinus bradycardia 56 bpm.  Right bundle branch block with repolarization changes.  Left anterior hemiblock.  Normal intervals.  No ectopy.  October 2018 ECG (independently read by me): normal sinus rhythm at 66 bpm.  Right bundle-branch block with repolarization changes.  Left anterior hemiblock.  Normal intervals.  March 2018 ECG (independently read by me): Sinus Bradycardia 56 bpm, right bundle branch block, left anterior hemiblock, LVH.  January 2017 ECG (independently read by me): Normal sinus rhythm at 60 bpm.  Right bundle-branch block and left anterior hemiblock consistent with bifascicular block.  QTc interval 432 ms.  February 2016 ECG (independently read by me): Sinus bradycardia 56 bpm.  Right bundle-branch block with repolarization changes.  Septal Q wave in V1.  November 2015 ECG (independently read by me): Marked sinus bradycardia at 44 bpm.  Right bundle branch block.  Probable left anterior fascicular block. Small septal Q waves  October 2014 ECG: Sinus bradycardia with right bundle branch block. Small Q waves in leads V1 and V2 concord with septal infarct that is old. Nonspecific T changes  laterally.  LABS:  Viewed recent November 2018 laboratory by Dr. Nadara Ross.  Total cholesterol 118, HDL 36, LDL 61, triglycerides 105.  Globin 17.3.  Creatinine 1.87. HbA1c 8.3     Latest Ref Rng & Units 01/12/2021   10:09 AM 12/22/2019   11:08 AM 12/10/2019    9:32 AM  BMP  Glucose 70 - 99 mg/dL 197  125  260   BUN 8 - 27 mg/dL 14  18  19   $ Creatinine 0.76 - 1.27 mg/dL 1.60  1.88  1.87   BUN/Creat Ratio 10 - 24 9  10  10   $ Sodium 134 - 144 mmol/L 141  141  140   Potassium 3.5 - 5.2 mmol/L 4.8  5.1  5.5   Chloride 96 - 106 mmol/L 106  107  104   CO2 20 - 29 mmol/L 21  23  24   $ Calcium 8.6 - 10.2 mg/dL 9.2  9.6  9.9       Latest Ref Rng & Units 01/12/2021   10:09 AM 12/10/2019    9:32 AM 01/09/2016    3:10 AM  Hepatic Function  Total Protein 6.0 - 8.5 g/dL 5.9  6.3  5.6   Albumin 3.7 - 4.7  g/dL 4.1  4.2  2.4   AST 0 - 40 IU/L 18  16  79   ALT 0 - 44 IU/L 17  23  123   Alk Phosphatase 44 - 121 IU/L 61  65  67   Total Bilirubin 0.0 - 1.2 mg/dL 0.4  0.4  0.9       Latest Ref Rng & Units 02/15/2022   10:24 AM 01/12/2021   10:09 AM 12/10/2019    9:32 AM  CBC  WBC 3.4 - 10.8 x10E3/uL  7.6  6.1   Hemoglobin 13.0 - 17.7 g/dL 15.7  12.9  14.9   Hematocrit 37.5 - 51.0 %  42.0  46.3   Platelets 150 - 450 x10E3/uL  188  160    Lab Results  Component Value Date   MCV 77 (L) 01/12/2021   MCV 82 12/10/2019   MCV 84.2 01/08/2016   Lab Results  Component Value Date   TSH 1.870 01/12/2021   Lab Results  Component Value Date   HGBA1C 8.7 (H) 01/12/2021   Lipid Panel     Component Value Date/Time   CHOL 108 01/12/2021 1009   TRIG 124 01/12/2021 1009   HDL 40 01/12/2021 1009   CHOLHDL 2.7 01/12/2021 1009   CHOLHDL 3.1 11/30/2013 0847   VLDL 27 11/30/2013 0847   LDLCALC 46 01/12/2021 1009     RADIOLOGY: No results found.  IMPRESSION:  1. Coronary artery disease due to lipid rich plaque   2. Hyperlipidemia LDL goal <55.   3. Type 2 diabetes mellitus with complication,  with long-term current use of insulin (HCC)   4. Stage 3b chronic kidney disease ([place])     ASSESSMENT AND PLAN: Maurice Ross is an 82 year-old Caucasian male who suffered an anterior wall myocardial infarction in 1997 and underwent initial intervention to his LAD and required repeat intervention secondary to restenosis in 1998. He has concomitant CAD and has been on medical therapy and fortunately over these many years has remained stable.  His nuclear perfusion study in August 2013 was unchanged from 2011 and only showed a very small region of abnormality in the apical septal region. His last echo Doppler study in October 2017 showed an EF of 50-55% with mild LVH and mild hypokinesis of the mid to distal anteroseptal wall with grade 1 diastolic dysfunction.  There was mild LA enlargement, mild mitral annular calcification, and trivial tricuspid regurgitation with normal pulmonary pressure.  His blood pressure today on repeat by me was excellent at 120/70 and he continues to be on his regimen of carvedilol 3.125 mg twice a day in addition to lisinopril 5 mg daily.  He continues to be on rosuvastatin 40 mg daily for hyperlipidemia: December 2021 was excellent at 40 and in December 2022 was 43.  He now sees Marcelle Overlie, [place] since Dr. Rory Ross has retired.  He continues to be followed by Dr. Michiel Ross for his diabetes mellitus and is on Farxiga 5 mg in addition to Humulin insulin.  He has stage IIIb CKD with creatinine in December 2022 at 1.84.  He has remained stable and is without chest pain but does admit to mild shortness of breath when he works excessively in his garden.  I have recommended in 6 months we obtain a follow-up echo Doppler study since his last evaluation was in 2017.  I will see him in follow-up of his echocardiographic study and further recommendations will be made at that time.  Troy Sine, MD, [facility]  05/03/2022 11:47 AM

## 2022-05-01 NOTE — Patient Instructions (Signed)
Medication Instructions:  The current medical regimen is effective;  continue present plan and medications.  *If you need a refill on your cardiac medications before your next appointment, please call your pharmacy*   Testing/Procedures: Echocardiogram (6 month) - Your physician has requested that you have an echocardiogram. Echocardiography is a painless test that uses sound waves to create images of your heart. It provides your doctor with information about the size and shape of your heart and how well your heart's chambers and valves are working. This procedure takes approximately one hour. There are no restrictions for this procedure.    Follow-Up: At [facility], you and your health needs are our priority.  As part of our continuing mission to provide you with exceptional heart care, we have created designated Provider Care Teams.  These Care Teams include your primary Cardiologist (physician) and Advanced Practice Providers (APPs -  Physician Assistants and Nurse Practitioners) who all work together to provide you with the care you need, when you need it.  We recommend signing up for the patient portal called "MyChart".  Sign up information is provided on this After Visit Summary.  MyChart is used to connect with patients for Virtual Visits (Telemedicine).  Patients are able to view lab/test results, encounter notes, upcoming appointments, etc.  Non-urgent messages can be sent to your provider as well.   To learn more about what you can do with MyChart, go to [email or web address].    Your next appointment:   6 month(s)  Provider:   Shelva Majestic, MD

## 2022-05-03 ENCOUNTER — Encounter: Payer: Self-pay | Admitting: Cardiovascular Disease

## 2022-05-29 ENCOUNTER — Telehealth: Payer: Self-pay | Admitting: Cardiovascular Disease

## 2022-05-29 NOTE — Telephone Encounter (Signed)
Calling to see if the patient ever been dx with irregular heartbeat. Please advise

## 2022-05-29 NOTE — Telephone Encounter (Signed)
Attempted to contact back, LVM to return call.   Left call back number.   I did not see of any dx of irregular heart beat in chart. Nothing recent or mentioned in last OV with Dr.Kelly.   Thanks!

## 2022-05-31 NOTE — Telephone Encounter (Signed)
Spoke with Lawerance Bach, NP.  Noted that patient did not have irregular heartbeat in chart.  She states he has one now and feels he needs to be seen. She states he has been very sick with a stomach virus and this is the only thing she can attribute it to if he has no history of irregularity.  Called to patient and he states that he is asymptomatic.  He states he has never had irregular heartbeat and did not know he had one until the NP saw him and told him this. He states having a stomach virus  fir about 10 days. He saw his PCP and was given Dicyclomine 10 mg BID for 15 days (started February 19).  He then had to see dermatology for spot removal of several areas , and was given doxycyline 100mg  BID for 7 days (started March 7). He has not had any issues while taking the medication.  No SOB, increased heart rate, flutters, nor any other symptoms.  He had one episode of dizziness when he jumped out of bed to get the phone one morning, which resolved and has not re-occurred. His next appt. Is August 14th for an echo and then August 23 with Cardiology.  Please advise  Did attempt to call Tabitha NP to get heart rate and any information but no answer.

## 2022-06-24 DIAGNOSIS — L308 Other specified dermatitis: Secondary | ICD-10-CM | POA: Diagnosis not present

## 2022-06-24 DIAGNOSIS — D485 Neoplasm of uncertain behavior of skin: Secondary | ICD-10-CM | POA: Diagnosis not present

## 2022-06-26 DIAGNOSIS — E1165 Type 2 diabetes mellitus with hyperglycemia: Secondary | ICD-10-CM | POA: Diagnosis not present

## 2022-06-28 NOTE — Telephone Encounter (Signed)
Called patient- checked on him after recent episodes of reported irregular heart beats. Patient states he has felt fine, no issues, no complaints. He feels good. He states he had a stomach bug during that time and believes it to be the cause of how he was feeling. He will notify the NP who sees him that we attempted to contact her, but was unable- if she has any other questions or concerns she can call us back.   He verbalized understanding.

## 2022-07-26 DIAGNOSIS — E1165 Type 2 diabetes mellitus with hyperglycemia: Secondary | ICD-10-CM | POA: Diagnosis not present

## 2022-08-08 DIAGNOSIS — N170 Acute kidney failure with tubular necrosis: Secondary | ICD-10-CM | POA: Diagnosis not present

## 2022-08-08 DIAGNOSIS — E211 Secondary hyperparathyroidism, not elsewhere classified: Secondary | ICD-10-CM | POA: Diagnosis not present

## 2022-08-08 DIAGNOSIS — N189 Chronic kidney disease, unspecified: Secondary | ICD-10-CM | POA: Diagnosis not present

## 2022-08-08 DIAGNOSIS — R809 Proteinuria, unspecified: Secondary | ICD-10-CM | POA: Diagnosis not present

## 2022-08-08 DIAGNOSIS — E559 Vitamin D deficiency, unspecified: Secondary | ICD-10-CM | POA: Diagnosis not present

## 2022-08-15 DIAGNOSIS — E1140 Type 2 diabetes mellitus with diabetic neuropathy, unspecified: Secondary | ICD-10-CM | POA: Diagnosis not present

## 2022-08-15 DIAGNOSIS — M79671 Pain in right foot: Secondary | ICD-10-CM | POA: Diagnosis not present

## 2022-08-15 DIAGNOSIS — I739 Peripheral vascular disease, unspecified: Secondary | ICD-10-CM | POA: Diagnosis not present

## 2022-08-15 DIAGNOSIS — I129 Hypertensive chronic kidney disease with stage 1 through stage 4 chronic kidney disease, or unspecified chronic kidney disease: Secondary | ICD-10-CM | POA: Diagnosis not present

## 2022-08-15 DIAGNOSIS — M79672 Pain in left foot: Secondary | ICD-10-CM | POA: Diagnosis not present

## 2022-08-15 DIAGNOSIS — M79675 Pain in left toe(s): Secondary | ICD-10-CM | POA: Diagnosis not present

## 2022-08-15 DIAGNOSIS — E1122 Type 2 diabetes mellitus with diabetic chronic kidney disease: Secondary | ICD-10-CM | POA: Diagnosis not present

## 2022-08-15 DIAGNOSIS — M79674 Pain in right toe(s): Secondary | ICD-10-CM | POA: Diagnosis not present

## 2022-08-15 DIAGNOSIS — I5032 Chronic diastolic (congestive) heart failure: Secondary | ICD-10-CM | POA: Diagnosis not present

## 2022-08-15 DIAGNOSIS — L110 Acquired keratosis follicularis: Secondary | ICD-10-CM | POA: Diagnosis not present

## 2022-08-15 DIAGNOSIS — E559 Vitamin D deficiency, unspecified: Secondary | ICD-10-CM | POA: Diagnosis not present

## 2022-08-26 DIAGNOSIS — E1165 Type 2 diabetes mellitus with hyperglycemia: Secondary | ICD-10-CM | POA: Diagnosis not present

## 2022-09-25 DIAGNOSIS — E1165 Type 2 diabetes mellitus with hyperglycemia: Secondary | ICD-10-CM | POA: Diagnosis not present

## 2022-10-01 DIAGNOSIS — I10 Essential (primary) hypertension: Secondary | ICD-10-CM | POA: Diagnosis not present

## 2022-10-01 DIAGNOSIS — N189 Chronic kidney disease, unspecified: Secondary | ICD-10-CM | POA: Diagnosis not present

## 2022-10-01 DIAGNOSIS — E7800 Pure hypercholesterolemia, unspecified: Secondary | ICD-10-CM | POA: Diagnosis not present

## 2022-10-01 DIAGNOSIS — E1165 Type 2 diabetes mellitus with hyperglycemia: Secondary | ICD-10-CM | POA: Diagnosis not present

## 2022-10-26 DIAGNOSIS — E1165 Type 2 diabetes mellitus with hyperglycemia: Secondary | ICD-10-CM | POA: Diagnosis not present

## 2022-10-29 DIAGNOSIS — N189 Chronic kidney disease, unspecified: Secondary | ICD-10-CM | POA: Diagnosis not present

## 2022-10-29 DIAGNOSIS — N183 Chronic kidney disease, stage 3 unspecified: Secondary | ICD-10-CM | POA: Diagnosis not present

## 2022-10-29 DIAGNOSIS — D631 Anemia in chronic kidney disease: Secondary | ICD-10-CM | POA: Diagnosis not present

## 2022-10-29 DIAGNOSIS — R809 Proteinuria, unspecified: Secondary | ICD-10-CM | POA: Diagnosis not present

## 2022-10-30 ENCOUNTER — Ambulatory Visit (HOSPITAL_COMMUNITY): Payer: Medicare Other | Attending: Cardiovascular Disease

## 2022-10-30 DIAGNOSIS — I2583 Coronary atherosclerosis due to lipid rich plaque: Secondary | ICD-10-CM | POA: Diagnosis not present

## 2022-10-30 DIAGNOSIS — I2510 Atherosclerotic heart disease of native coronary artery without angina pectoris: Secondary | ICD-10-CM | POA: Insufficient documentation

## 2022-10-30 LAB — ECHOCARDIOGRAM COMPLETE
Area-P 1/2: 2.92 cm2
S' Lateral: 3.8 cm

## 2022-10-30 MED ORDER — PERFLUTREN LIPID MICROSPHERE
1.0000 mL | INTRAVENOUS | Status: AC | PRN
  Administered 2022-10-30: 2 mL via INTRAVENOUS

## 2022-11-04 DIAGNOSIS — R809 Proteinuria, unspecified: Secondary | ICD-10-CM | POA: Diagnosis not present

## 2022-11-04 DIAGNOSIS — I5032 Chronic diastolic (congestive) heart failure: Secondary | ICD-10-CM | POA: Diagnosis not present

## 2022-11-04 DIAGNOSIS — E875 Hyperkalemia: Secondary | ICD-10-CM | POA: Diagnosis not present

## 2022-11-04 DIAGNOSIS — E1122 Type 2 diabetes mellitus with diabetic chronic kidney disease: Secondary | ICD-10-CM | POA: Diagnosis not present

## 2022-11-06 DIAGNOSIS — M47815 Spondylosis without myelopathy or radiculopathy, thoracolumbar region: Secondary | ICD-10-CM | POA: Diagnosis not present

## 2022-11-06 DIAGNOSIS — M79675 Pain in left toe(s): Secondary | ICD-10-CM | POA: Diagnosis not present

## 2022-11-06 DIAGNOSIS — M79605 Pain in left leg: Secondary | ICD-10-CM | POA: Diagnosis not present

## 2022-11-06 DIAGNOSIS — M545 Low back pain, unspecified: Secondary | ICD-10-CM | POA: Diagnosis not present

## 2022-11-06 DIAGNOSIS — M79674 Pain in right toe(s): Secondary | ICD-10-CM | POA: Diagnosis not present

## 2022-11-06 DIAGNOSIS — M79671 Pain in right foot: Secondary | ICD-10-CM | POA: Diagnosis not present

## 2022-11-06 DIAGNOSIS — I739 Peripheral vascular disease, unspecified: Secondary | ICD-10-CM | POA: Diagnosis not present

## 2022-11-06 DIAGNOSIS — M79672 Pain in left foot: Secondary | ICD-10-CM | POA: Diagnosis not present

## 2022-11-06 DIAGNOSIS — E1140 Type 2 diabetes mellitus with diabetic neuropathy, unspecified: Secondary | ICD-10-CM | POA: Diagnosis not present

## 2022-11-06 DIAGNOSIS — R202 Paresthesia of skin: Secondary | ICD-10-CM | POA: Diagnosis not present

## 2022-11-06 DIAGNOSIS — L110 Acquired keratosis follicularis: Secondary | ICD-10-CM | POA: Diagnosis not present

## 2022-11-06 DIAGNOSIS — M47816 Spondylosis without myelopathy or radiculopathy, lumbar region: Secondary | ICD-10-CM | POA: Diagnosis not present

## 2022-11-07 NOTE — Progress Notes (Unsigned)
Patient ID: BENJAMINE Ross, male   DOB: Sep 19, 1940, 82 y.o.   MRN: [id number]      Primary MD: Maurice Ross  HPI: Maurice Ross, is a 82 y.o. male who presents for a 6 month follow-up cardiology evaluation.  Maurice Ross has established CAD and in July 1997 suffered an anterior wall myocardial infarction and underwent PTCA/stenting of his LAD. Repeat intervention was done in 1998 due to in-stent restenosis. At that time he also had moderate disease in his circumflex and right coronary artery which medical therapy has been recommended. His last nuclear perfusion study was done in August 2013 which showed old scar in the apical septal and apical region. Ejection fraction was 57%. This study was unchanged from 2 years previously.  He has history of type 2 diabetes mellitus  He, his hemoglobin A1c had risen to 12.6 on 12/23/2013.  Over the past year he had seen Maurice Ross for endocrinologic evaluation.  Lab work by Maurice Ross on 01/13/2015 still demonstrated increased hemoglobin A1c at 10.6.  She discontinued his Actos, Glucophage, and Glucotrol, and he is now taking Humalog insulin.  He states that his blood sugars are better.  He is also seeing Maurice Ross who is following his kidney stones.  An echo Doppler study on 01/25/2014 showed an ejection fraction of 45-50% which was not significantly changed from his prior echo in September 2011.  There was grade 1 diastolic dysfunction.  There was evidence for aortic sclerosis.  There was mild pulmonic regurgitation.  He had increased stress  In 2016 with deaths of 3 of his family members including his father age 71, mother age 105, and mother-in-law on Christmas Eve age 66.    Since July 2017, he has had difficulty with kidney stones initially underwent a trial of lithotripsy but ultimately required surgical removal.  He developed allergic reaction to the anesthesia which led to hospitalization for 2 days in October 2017. An echo Doppler study was done during  that evaluation after he was found to have no troponin elevation.  He was found to have mild LVH with an EF of 50-55%.  There was mild hypokinesis of the mid distal anteroseptal wall, grade 1 diastolic dysfunction, mild LA enlargement, mild mitral annular calcification and he had normal pulmonary pressures.  I saw him in October 2018 and last saw him in April 2019. He denied any anginal symptomatology.  He was on lisinopril 5 mg, carvedilol 3.125 mg twice a day.  He was tolerating rosuvastatin 40 mg without side effects for hyperlipidemia.  He is diabetic on insulin. His hemoglobin A1c was 12.2 and in January 2019 his most recent hemoglobin A1c was 8.3.  He has not had any recurrent kidney stones October 2017.  During his last evaluation his blood pressure was well controlled.  I saw him in August 2020.  His primary MD had retired and he was seeing Maurice Ross.  He establish care with Maurice Ross of endocrinology for his diabetes mellitus a  In November 2019 LDL cholesterol was 50 total cholesterol 102.  Post recent hemoglobin A1c on August 17, 2018 remained elevated at 8.7.  He has chronic kidney disease laboratory on September 14, 2018 showed a creatinine of 1.94.  He remained active and does yard work.  He denied chest pain PND orthopnea or awareness of palpitations.   I saw him on December 10, 2019.  Since his prior evaluation he continued to do well.  His blood pressure at home  was typically running in the 120s over 70s.  He was without angina, shortness of breath or palpitations and remained asymptomatic.   At times he believes that his balance may not be as good particularly on uneven ground.  He denies any dizziness or syncope.  He has stage IIIb CKD and creatinine in April 2021 had improved to 1.79.  I saw him on January 12, 2022.  He is followed by Maurice Ross for primary care since Dr Maurice Ross had retired.  He remains active and does gardening.  He believes he is sleeping well and typically goes  to bed at 10 PM and wakes up at 7 AM.  He remains active without chest pain, shortness of breath, or palpitations.  He sees Maurice Ross for his diabetes mellitus.  In October 2021 creatinine had increased to 1.88 and in December 2021 creatinine was 1.93.  Hemoglobin A1c was 7.6 in July 2022.    I last saw him on May 01, 2022 at which time he continued to feel well and remained active.  He denies chest pain or shortness of breath.  He sees Maurice Ross in [place] for his renal disease.  At times he notes some mild shortness of breath when he works excessively in the yard doing gardening.  He is unaware of palpitations, presyncope or syncope.  He continues to be on carvedilol 3.125 mg twice a day and lisinopril 5 mg daily for hypertension.  He is on rosuvastatin 40 mg for hyperlipidemia.  He is on [place] and Humulin insulin for his diabetes mellitus and continues to take aspirin.  He presents for follow-up evaluation.   Past Medical History:  Diagnosis Date   Atypical mole 05/2015   right supra pubic mild atypia   Basal cell carcinoma 2000   right jawline curet 3 exc   Basal cell carcinoma 2017   left cheek cx3 20fu   Basal cell carcinoma 2019   left clavical cx3 86fu   CKD (chronic kidney disease), stage III (HCC)    Coronary artery disease CARDIOLOGIST-  DR Maurice Ross   Coronary stent 1997, Last cath 1998, Last Nuc 10/24/2011-no change from previous, Last Echo 2011 EF 45%   Diabetes mellitus type 2, uncontrolled    per pt last A1c 8.8 in Aug 2017   GERD (gastroesophageal reflux disease)    History of acute anterior wall MI    1997   History of adenomatous polyp of colon    adenomatous 2004;   tubular adenoma 2013   History of basal cell carcinoma excision    05-24-2015  left cheek   History of esophageal dilatation    Hyperlipidemia    lipomet analysis LDL particle # at 786   Nodular basal cell carcinoma (BCC) 10/04/2020   Right Anterior Neck   RBBB (right bundle branch block  with left posterior fascicular block)    Renal calculus, right    Renal cyst    S/P coronary artery stent placement    1997  --  PCI  and stenting to LAD   SCC (squamous cell carcinoma) 2011   left neck tx with bx   SCC (squamous cell carcinoma) 2018   left forearm tx with bx   SCC (squamous cell carcinoma) 2018   right outer eyebore cx3 3fu   SCC (squamous cell carcinoma) 2018   right post neck cx3 37fu   SCCA (squamous cell carcinoma) of skin 10/04/2020   Right Malar Cheek (in situ)   SCCA (squamous  cell carcinoma) of skin 10/04/2020   Right Sideburn (well diff)   Squamous cell carcinoma of skin 11/25/2019   IN SITU- left 2nd finger metacarpophalangeal joint (txpbx)   Wears glasses    Wears hearing aid    bilateral    Past Surgical History:  Procedure Laterality Date   BALLOON DILATION  01/24/2012   Procedure: BALLOON DILATION;  Surgeon: Malissa Hippo, MD;  [facility];  Service: Endoscopy;  Laterality: N/A;   CARDIOVASCULAR STRESS TEST  10-24-2011   dr Tresa Endo   Low risk nuclear study w/ mild to moderate perfustion defect due to infarct/scar w/  mild perinfarct ischemia in the apical septal and apical regions/  normal LV function and mild hypokinesis in the apical and apical septal (no sign. change compared to previous study()   CATARACT EXTRACTION W/ INTRAOCULAR LENS  IMPLANT, BILATERAL  2015   COLONOSCOPY N/A 04/10/2017   Procedure: COLONOSCOPY;  Surgeon: Malissa Hippo, MD;  [facility]: [facility];  Service: Endoscopy;  Laterality: N/A;  1115   COLONOSCOPY WITH ESOPHAGOGASTRODUODENOSCOPY (EGD)  01/24/2012   Procedure: COLONOSCOPY WITH ESOPHAGOGASTRODUODENOSCOPY (EGD);  Surgeon: Malissa Hippo, MD;  [facility]: [facility];  Service: Endoscopy;  Laterality: N/A;  955   CORONARY ANGIOPLASTY  06/08/1996   dr Maurice Ross   Balloon angioplasty to  pLAD for in-stent restenosis, no change mLAD 30-50%/  narrowing  midRCA 40-50%/  mild ectasia of LCX w/ 30-50%  proximal  and marginal narrowing/  mild LV dysfunction w/ anterolateral hypocontractility   CORONARY ANGIOPLASTY WITH STENT PLACEMENT  09-15-1995    dr Tresa Endo   PTCA/ Stenting (palmaz-schatz)  to proximal LAD   CYSTOSCOPY WITH STENT PLACEMENT Right 01/01/2016   Procedure: CYSTOSCOPY WITH STENT PLACEMENT;  Surgeon: Marcine Matar, MD;  Location: [facility];  Service: Urology;  Laterality: Right;   CYSTOSCOPY/RETROGRADE/URETEROSCOPY/STONE EXTRACTION WITH BASKET Right 01/01/2016   Procedure: CYSTOSCOPY/RETROGRADE/URETEROSCOPY;  Surgeon: Marcine Matar, MD;  Location: [facility];  Service: Urology;  Laterality: Right;   EXTRACORPOREAL SHOCK WAVE LITHOTRIPSY  10-30-2015;   11-25-2011   HOLMIUM LASER APPLICATION Right 01/01/2016   Procedure: HOLMIUM LASER APPLICATION;  Surgeon: Marcine Matar, MD;  Location: [facility];  Service: Urology;  Laterality: Right;   LUMBAR MICRODISCECTOMY  1998   L5 -- S1   MALONEY DILATION  01/24/2012   Procedure: MALONEY DILATION;  Surgeon: Malissa Hippo, MD;  [facility];  Service: Endoscopy;  Laterality: N/A;   POLYPECTOMY  04/10/2017   Procedure: POLYPECTOMY;  Surgeon: Malissa Hippo, MD;  [facility];  Service: Endoscopy;;   SAVORY DILATION  01/24/2012   Procedure: SAVORY DILATION;  Surgeon: Malissa Hippo, MD;  [facility];  Service: Endoscopy;  Laterality: N/A;   TRANSTHORACIC ECHOCARDIOGRAM  01/25/2014   mild concentric LVH, ef 45-50%, grade 1 diastolic dysfunction/  mild PR    Allergies  Allergen Reactions   Other     General anesthesia - patient reports when he had his kidney surgery around 2018     Current Outpatient Medications  Medication Sig Dispense Refill   aspirin 81 MG tablet Take 1 tablet (81 mg total) by mouth daily. 30 tablet    carvedilol (COREG) 3.125 MG tablet Take 1 tablet by mouth two  times daily 180 tablet 1   dapagliflozin propanediol (FARXIGA) 5 MG  TABS tablet Take 5 mg by mouth daily.     fluorouracil (EFUDEX) 5 % cream Apply  topically daily. apply qhs x 2 weeds 40 g 0   HUMULIN 70/30 (70-30) 100 UNIT/ML injection Inject into the skin.     lisinopril (PRINIVIL,ZESTRIL) 5 MG tablet Take 5 mg by mouth daily.      ONETOUCH VERIO test strip 1 each 3 (three) times daily.     ranitidine (ZANTAC) 150 MG tablet Take 150 mg by mouth daily.      rosuvastatin (CRESTOR) 40 MG tablet Take 40 mg by mouth every evening.      No current facility-administered medications for this visit.    Social History   Socioeconomic History   Marital status: Married    Spouse name: Alona Bene   Number of children: 2   Years of education: Not on file   Highest education level: Not on file  Occupational History   Occupation: retired Scientist, product/process development  Tobacco Use   Smoking status: Former    Types: Pipe, Cigars    Quit date: 12/27/2011    Years since quitting: 10.8   Smokeless tobacco: Never  Vaping Use   Vaping status: Never Used  Substance and Sexual Activity   Alcohol use: No    Alcohol/week: 0.0 standard drinks of alcohol   Drug use: No   Sexual activity: Not on file  Other Topics Concern   Not on file  Social History Narrative   Not on file   Social Determinants of Health   Financial Resource Strain: Not on file  Food Insecurity: Not on file  Transportation Needs: Not on file  Physical Activity: Not on file  Stress: Not on file  Social Connections: Not on file  Intimate Partner Violence: Not on file    Family History  Problem Relation Age of Onset   Heart disease Mother 36   Heart disease Father 63   Healthy Son    Healthy Son    Social history is notable that he is married has 2 children. He does not routinely exercise. There is no tobacco or alcohol use.   ROS General: Negative; No fevers, chills, or night sweats;  HEENT: Negative; No changes in vision or hearing, sinus congestion, difficulty swallowing Pulmonary: Negative; No  cough, wheezing, shortness of breath, hemoptysis Cardiovascular: See HPI GI: positive for GERD; No nausea, vomiting, diarrhea, or abdominal pain GU: Positive for kidney stones; No dysuria, hematuria, or difficulty voiding Musculoskeletal: Negative; no myalgias, joint pain, or weakness Hematologic/Oncology: Negative; no easy bruising, bleeding Endocrine: positive for poorly controlled diabetes mellitus.  No thyroid abnormalities. Neuro: Negative; no changes in balance, headaches Skin: Negative; No rashes or skin lesions Psychiatric: Negative; No behavioral problems, depression Sleep: Negative; No snoring, daytime sleepiness, hypersomnolence, bruxism, restless legs, hypnogognic hallucinations, no cataplexy Other comprehensive 14 point system review is negative.   PE BP 132/84   Pulse (!) 53   Ht 5\' 7"  (1.702 m)   Wt 205 lb (93 kg)   SpO2 95%   BMI 32.11 kg/m    Repeat blood pressure by me was 120/70.  Since his last Maurice visit with me he has lost 13 pounds from 213.  Wt Readings from Last 3 Encounters:  11/08/22 205 lb (93 kg)  05/01/22 200 lb 3.2 oz (90.8 kg)  11/22/21 212 lb 3.2 oz (96.3 kg)     Physical Exam BP 132/84   Pulse (!) 53   Ht 5\' 7"  (1.702 m)   Wt 205 lb (93 kg)   SpO2 95%   BMI 32.11 kg/m  General: Alert, oriented, no distress.  Skin: normal turgor, no rashes, warm and dry HEENT: Normocephalic, atraumatic. Pupils equal round and reactive to light; sclera anicteric; extraocular muscles intact; Fundi ** Nose without nasal septal hypertrophy Mouth/Parynx benign; Mallinpatti scale Neck: No JVD, no carotid bruits; normal carotid upstroke Lungs: clear to ausculatation and percussion; no wheezing or rales Chest wall: without tenderness to palpitation Heart: PMI not displaced, RRR, s1 s2 normal, 1/6 systolic murmur, no diastolic murmur, no rubs, gallops, thrills, or heaves Abdomen: soft, nontender; no hepatosplenomehaly, BS+; abdominal aorta nontender and not  dilated by palpation. Back: no CVA tenderness Pulses 2+ Musculoskeletal: full range of motion, normal strength, no joint deformities Extremities: no clubbing cyanosis or edema, Homan's sign negative  Neurologic: grossly nonfocal; Cranial nerves grossly wnl Psychologic: Normal mood and affect    General: Alert, oriented, no distress.  Skin: normal turgor, no rashes, warm and dry HEENT: Normocephalic, atraumatic. Pupils equal round and reactive to light; sclera anicteric; extraocular muscles intact;  Nose without nasal septal hypertrophy Mouth/Parynx benign; Mallinpatti scale 3 Neck: No JVD, no carotid bruits; normal carotid upstroke Lungs: clear to ausculatation and percussion; no wheezing or rales Chest wall: without tenderness to palpitation Heart: PMI not displaced, RRR, s1 s2 normal, 1/6 systolic murmur, no diastolic murmur, no rubs, gallops, thrills, or heaves Abdomen: Large diastases recti; soft, nontender; no hepatosplenomehaly, BS+; abdominal aorta nontender and not dilated by palpation. Back: no CVA tenderness Pulses 2+ Musculoskeletal: full range of motion, normal strength, no joint deformities Extremities: no clubbing cyanosis or edema, Homan's sign negative  Neurologic: grossly nonfocal; Cranial nerves grossly wnl Psychologic: Normal mood and affect  EKG Interpretation Date/Time:  Friday November 08 2022 12:57:40 EDT Ventricular Rate:  53 PR Interval:  152 QRS Duration:  144 QT Interval:  442 QTC Calculation: 414 R Axis:   -70  Text Interpretation: Sinus bradycardia Right bundle branch block Left anterior fascicular block *** Bifascicular block *** Septal infarct , age undetermined When compared with ECG of 08-Jan-2016 09:52, PREVIOUS ECG IS PRESENT Confirmed by Maurice Ross ([id number]) on 11/08/2022 1:12:54 PM    May 01, 2022 ECG (independently read by me): Sinus rhythm at 61, PAC, RBBB, LAHB, LVH   January 12, 2021 ECG (independently read by me):  Sinus bradycardia  at 56, RBBB, LAHB, no ectopy, normal intervals  December 10, 2019 ECG (independently read by me): Normal sinus rhythm at 62 bpm.  Right bundle branch block, left anterior hemiblock.  QTc interval 456 ms.  August 2020 ECG (independently read by me): Sinus bradycardia 54 bpm with mild sinus arrhythmia, right bundle branch block with repolarization changes, left anterior hemiblock.  April 2019 ECG (independently read by me): Sinus bradycardia 56 bpm.  Right bundle branch block with repolarization changes.  Left anterior hemiblock.  Normal intervals.  No ectopy.  October 2018 ECG (independently read by me): normal sinus rhythm at 66 bpm.  Right bundle-branch block with repolarization changes.  Left anterior hemiblock.  Normal intervals.  March 2018 ECG (independently read by me): Sinus Bradycardia 56 bpm, right bundle branch block, left anterior hemiblock, LVH.  January 2017 ECG (independently read by me): Normal sinus rhythm at 60 bpm.  Right bundle-branch block and left anterior hemiblock consistent with bifascicular block.  QTc interval 432 ms.  February 2016 ECG (independently read by me): Sinus bradycardia 56 bpm.  Right bundle-branch block with repolarization changes.  Septal Q wave in V1.  November 2015 ECG (independently read by me): Marked sinus bradycardia at 44 bpm.  Right bundle branch block.  Probable left anterior fascicular block. Small septal Q waves  October 2014 ECG: Sinus bradycardia with right bundle branch block. Small Q waves in leads V1 and V2 concord with septal infarct that is old. Nonspecific T changes laterally.  LABS:  Viewed recent November 2018 laboratory by Maurice Ross.  Total cholesterol 118, HDL 36, LDL 61, triglycerides 105.  Globin 17.3.  Creatinine 1.87. HbA1c 8.3     Latest Ref Rng & Units 01/12/2021   10:09 AM 12/22/2019   11:08 AM 12/10/2019    9:32 AM  BMP  Glucose 70 - 99 mg/dL 308  657  846   BUN 8 - 27 mg/dL 14  18  19    Creatinine 0.76 - 1.27  mg/dL 9.62  9.52  8.41   BUN/Creat Ratio 10 - 24 9  10  10    Sodium 134 - 144 mmol/L 141  141  140   Potassium 3.5 - 5.2 mmol/L 4.8  5.1  5.5   Chloride 96 - 106 mmol/L 106  107  104   CO2 20 - 29 mmol/L 21  23  24    Calcium 8.6 - 10.2 mg/dL 9.2  9.6  9.9       Latest Ref Rng & Units 01/12/2021   10:09 AM 12/10/2019    9:32 AM 01/09/2016    3:10 AM  Hepatic Function  Total Protein 6.0 - 8.5 g/dL 5.9  6.3  5.6   Albumin 3.7 - 4.7 g/dL 4.1  4.2  2.4   AST 0 - 40 IU/L 18  16  79   ALT 0 - 44 IU/L 17  23  123   Alk Phosphatase 44 - 121 IU/L 61  65  67   Total Bilirubin 0.0 - 1.2 mg/dL 0.4  0.4  0.9       Latest Ref Rng & Units 02/15/2022   10:24 AM 01/12/2021   10:09 AM 12/10/2019    9:32 AM  CBC  WBC 3.4 - 10.8 x10E3/uL  7.6  6.1   Hemoglobin 13.0 - 17.7 g/dL 32.4  40.1  02.7   Hematocrit 37.5 - 51.0 %  42.0  46.3   Platelets 150 - 450 x10E3/uL  188  160    Lab Results  Component Value Date   MCV 77 (L) 01/12/2021   MCV 82 12/10/2019   MCV 84.2 01/08/2016   Lab Results  Component Value Date   TSH 1.870 01/12/2021   Lab Results  Component Value Date   HGBA1C 8.7 (H) 01/12/2021   Lipid Panel     Component Value Date/Time   CHOL 108 01/12/2021 1009   TRIG 124 01/12/2021 1009   HDL 40 01/12/2021 1009   CHOLHDL 2.7 01/12/2021 1009   CHOLHDL 3.1 11/30/2013 0847   VLDL 27 11/30/2013 0847   LDLCALC 46 01/12/2021 1009     RADIOLOGY:   ECHO: 10/30/2022  1. Left ventricular ejection fraction, by estimation, is 50 to 55%. The  left ventricle has low normal function. Left ventricular endocardial  border not optimally defined to evaluate regional wall motion. Left  ventricular diastolic parameters are  consistent with Grade I diastolic dysfunction (impaired relaxation).   2. Right ventricular systolic function is normal. The right ventricular  size is normal.   3. Left atrial size was mildly dilated.   4. The mitral valve is normal in structure. No evidence of mitral  valve  regurgitation. No evidence of mitral stenosis.   5. The aortic valve is tricuspid. Aortic  valve regurgitation is trivial.  No aortic stenosis is present.   6. There is mild dilatation of the ascending aorta, measuring 43 mm.   7. The inferior vena cava is normal in size with greater than 50%  respiratory variability, suggesting right atrial pressure of 3 mmHg.   IMPRESSION:  1. Essential hypertension   2. Coronary artery disease due to lipid rich plaque   3. RBBB      ASSESSMENT AND PLAN: Mr. Rijul Ammar is an 82 year-old Caucasian male who suffered an anterior wall myocardial infarction in 1997 and underwent initial intervention to his LAD and required repeat intervention secondary to restenosis in 1998. He has concomitant CAD and has been on medical therapy and fortunately over these many years has remained stable.  His nuclear perfusion study in August 2013 was unchanged from 2011 and only showed a very small region of abnormality in the apical septal region. His last echo Doppler study in October 2017 showed an EF of 50-55% with mild LVH and mild hypokinesis of the mid to distal anteroseptal wall with grade 1 diastolic dysfunction.  There was mild LA enlargement, mild mitral annular calcification, and trivial tricuspid regurgitation with normal pulmonary pressure.  His blood pressure today on repeat by me was excellent at 120/70 and he continues to be on his regimen of carvedilol 3.125 mg twice a day in addition to lisinopril 5 mg daily.  He continues to be on rosuvastatin 40 mg daily for hyperlipidemia: December 2021 was excellent at 57 and in December 2022 was 50.  He now sees Bunnie Pion, [place] since Maurice Ross has retired.  He continues to be followed by Maurice Ross for his diabetes mellitus and is on Farxiga 5 mg in addition to Humulin insulin.  He has stage IIIb CKD with creatinine in December 2022 at 1.84.  He has remained stable and is without chest pain but does admit to mild  shortness of breath when he works excessively in his garden.  I have recommended in 6 months we obtain a follow-up echo Doppler study since his last evaluation was in 2017.  I will see him in follow-up of his echocardiographic study and further recommendations will be made at that time.    Lennette Bihari, MD, [facility]  11/08/2022 1:13 PM

## 2022-11-08 ENCOUNTER — Encounter: Payer: Self-pay | Admitting: Cardiovascular Disease

## 2022-11-08 ENCOUNTER — Ambulatory Visit: Payer: Medicare Other | Admitting: Cardiovascular Disease

## 2022-11-08 VITALS — BP 132/84 | HR 53 | Ht 67.0 in | Wt 205.0 lb

## 2022-11-08 DIAGNOSIS — I452 Bifascicular block: Secondary | ICD-10-CM

## 2022-11-08 DIAGNOSIS — I2510 Atherosclerotic heart disease of native coronary artery without angina pectoris: Secondary | ICD-10-CM | POA: Diagnosis not present

## 2022-11-08 DIAGNOSIS — Z9861 Coronary angioplasty status: Secondary | ICD-10-CM

## 2022-11-08 DIAGNOSIS — I10 Essential (primary) hypertension: Secondary | ICD-10-CM | POA: Diagnosis not present

## 2022-11-08 DIAGNOSIS — I2583 Coronary atherosclerosis due to lipid rich plaque: Secondary | ICD-10-CM | POA: Diagnosis not present

## 2022-11-08 DIAGNOSIS — I77810 Thoracic aortic ectasia: Secondary | ICD-10-CM | POA: Diagnosis not present

## 2022-11-08 DIAGNOSIS — I4510 Unspecified right bundle-branch block: Secondary | ICD-10-CM

## 2022-11-08 DIAGNOSIS — Z794 Long term (current) use of insulin: Secondary | ICD-10-CM | POA: Diagnosis not present

## 2022-11-08 DIAGNOSIS — E785 Hyperlipidemia, unspecified: Secondary | ICD-10-CM | POA: Diagnosis not present

## 2022-11-08 DIAGNOSIS — N1832 Chronic kidney disease, stage 3b: Secondary | ICD-10-CM | POA: Diagnosis not present

## 2022-11-08 DIAGNOSIS — E118 Type 2 diabetes mellitus with unspecified complications: Secondary | ICD-10-CM | POA: Diagnosis not present

## 2022-11-08 MED ORDER — HYDROCHLOROTHIAZIDE 25 MG PO TABS: 25.0000 mg | ORAL_TABLET | ORAL | 0 refills | Status: DC | PRN

## 2022-11-08 NOTE — Patient Instructions (Addendum)
Medication Instructions:  Take the hydrochlorothiazide as needed for swelling *If you need a refill on your cardiac medications before your next appointment, please call your pharmacy*   Lab Work: If you have labs (blood work) drawn today and your tests are completely normal, you will receive your results only by: MyChart Message (if you have MyChart) OR A paper copy in the mail If you have any lab test that is abnormal or we need to change your treatment, we will call you to review the results.   Follow-Up: At [facility], you and your health needs are our priority.  As part of our continuing mission to provide you with exceptional heart care, we have created designated Provider Care Teams.  These Care Teams include your primary Cardiologist (physician) and Advanced Practice Providers (APPs -  Physician Assistants and Nurse Practitioners) who all work together to provide you with the care you need, when you need it.  We recommend signing up for the patient portal called "MyChart".  Sign up information is provided on this After Visit Summary.  MyChart is used to connect with patients for Virtual Visits (Telemedicine).  Patients are able to view lab/test results, encounter notes, upcoming appointments, etc.  Non-urgent messages can be sent to your provider as well.   To learn more about what you can do with MyChart, go to [email or web address].    Your next appointment:   8-9 month(s) a letter will be mailed to you as a reminder to call the office for your next follow up appointment.  Provider:   Nicki Guadalajara, MD

## 2022-11-09 ENCOUNTER — Encounter: Payer: Self-pay | Admitting: Cardiovascular Disease

## 2022-11-25 DIAGNOSIS — Z961 Presence of intraocular lens: Secondary | ICD-10-CM | POA: Diagnosis not present

## 2022-11-25 DIAGNOSIS — E113553 Type 2 diabetes mellitus with stable proliferative diabetic retinopathy, bilateral: Secondary | ICD-10-CM | POA: Diagnosis not present

## 2022-12-12 DIAGNOSIS — Z23 Encounter for immunization: Secondary | ICD-10-CM | POA: Diagnosis not present

## 2023-01-02 DIAGNOSIS — E1165 Type 2 diabetes mellitus with hyperglycemia: Secondary | ICD-10-CM | POA: Diagnosis not present

## 2023-01-14 DIAGNOSIS — L110 Acquired keratosis follicularis: Secondary | ICD-10-CM | POA: Diagnosis not present

## 2023-01-14 DIAGNOSIS — M79674 Pain in right toe(s): Secondary | ICD-10-CM | POA: Diagnosis not present

## 2023-01-14 DIAGNOSIS — M79675 Pain in left toe(s): Secondary | ICD-10-CM | POA: Diagnosis not present

## 2023-01-14 DIAGNOSIS — E1140 Type 2 diabetes mellitus with diabetic neuropathy, unspecified: Secondary | ICD-10-CM | POA: Diagnosis not present

## 2023-01-14 DIAGNOSIS — I739 Peripheral vascular disease, unspecified: Secondary | ICD-10-CM | POA: Diagnosis not present

## 2023-01-14 DIAGNOSIS — Z23 Encounter for immunization: Secondary | ICD-10-CM | POA: Diagnosis not present

## 2023-01-14 DIAGNOSIS — M79672 Pain in left foot: Secondary | ICD-10-CM | POA: Diagnosis not present

## 2023-01-14 DIAGNOSIS — M79671 Pain in right foot: Secondary | ICD-10-CM | POA: Diagnosis not present

## 2023-01-16 ENCOUNTER — Other Ambulatory Visit: Payer: Self-pay | Admitting: Cardiovascular Disease

## 2023-01-20 DIAGNOSIS — D631 Anemia in chronic kidney disease: Secondary | ICD-10-CM | POA: Diagnosis not present

## 2023-01-20 DIAGNOSIS — R809 Proteinuria, unspecified: Secondary | ICD-10-CM | POA: Diagnosis not present

## 2023-01-20 DIAGNOSIS — N1832 Chronic kidney disease, stage 3b: Secondary | ICD-10-CM | POA: Diagnosis not present

## 2023-01-20 DIAGNOSIS — N2581 Secondary hyperparathyroidism of renal origin: Secondary | ICD-10-CM | POA: Diagnosis not present

## 2023-01-27 DIAGNOSIS — I5032 Chronic diastolic (congestive) heart failure: Secondary | ICD-10-CM | POA: Diagnosis not present

## 2023-01-27 DIAGNOSIS — E1122 Type 2 diabetes mellitus with diabetic chronic kidney disease: Secondary | ICD-10-CM | POA: Diagnosis not present

## 2023-01-27 DIAGNOSIS — I129 Hypertensive chronic kidney disease with stage 1 through stage 4 chronic kidney disease, or unspecified chronic kidney disease: Secondary | ICD-10-CM | POA: Diagnosis not present

## 2023-01-27 DIAGNOSIS — R809 Proteinuria, unspecified: Secondary | ICD-10-CM | POA: Diagnosis not present

## 2023-02-24 DIAGNOSIS — E119 Type 2 diabetes mellitus without complications: Secondary | ICD-10-CM | POA: Diagnosis not present

## 2023-02-24 DIAGNOSIS — D649 Anemia, unspecified: Secondary | ICD-10-CM | POA: Diagnosis not present

## 2023-02-24 DIAGNOSIS — N189 Chronic kidney disease, unspecified: Secondary | ICD-10-CM | POA: Diagnosis not present

## 2023-02-24 DIAGNOSIS — Z0000 Encounter for general adult medical examination without abnormal findings: Secondary | ICD-10-CM | POA: Diagnosis not present

## 2023-03-03 DIAGNOSIS — Z0000 Encounter for general adult medical examination without abnormal findings: Secondary | ICD-10-CM | POA: Diagnosis not present

## 2023-03-03 DIAGNOSIS — R030 Elevated blood-pressure reading, without diagnosis of hypertension: Secondary | ICD-10-CM | POA: Diagnosis not present

## 2023-03-25 DIAGNOSIS — M79672 Pain in left foot: Secondary | ICD-10-CM | POA: Diagnosis not present

## 2023-03-25 DIAGNOSIS — E1140 Type 2 diabetes mellitus with diabetic neuropathy, unspecified: Secondary | ICD-10-CM | POA: Diagnosis not present

## 2023-03-25 DIAGNOSIS — M79675 Pain in left toe(s): Secondary | ICD-10-CM | POA: Diagnosis not present

## 2023-03-25 DIAGNOSIS — M79671 Pain in right foot: Secondary | ICD-10-CM | POA: Diagnosis not present

## 2023-03-25 DIAGNOSIS — M79674 Pain in right toe(s): Secondary | ICD-10-CM | POA: Diagnosis not present

## 2023-03-25 DIAGNOSIS — I739 Peripheral vascular disease, unspecified: Secondary | ICD-10-CM | POA: Diagnosis not present

## 2023-03-25 DIAGNOSIS — L110 Acquired keratosis follicularis: Secondary | ICD-10-CM | POA: Diagnosis not present

## 2023-04-03 DIAGNOSIS — I10 Essential (primary) hypertension: Secondary | ICD-10-CM | POA: Diagnosis not present

## 2023-04-03 DIAGNOSIS — E1165 Type 2 diabetes mellitus with hyperglycemia: Secondary | ICD-10-CM | POA: Diagnosis not present

## 2023-04-03 DIAGNOSIS — N189 Chronic kidney disease, unspecified: Secondary | ICD-10-CM | POA: Diagnosis not present

## 2023-04-03 DIAGNOSIS — E7800 Pure hypercholesterolemia, unspecified: Secondary | ICD-10-CM | POA: Diagnosis not present

## 2023-04-07 DIAGNOSIS — E1165 Type 2 diabetes mellitus with hyperglycemia: Secondary | ICD-10-CM | POA: Diagnosis not present

## 2023-04-29 DIAGNOSIS — R809 Proteinuria, unspecified: Secondary | ICD-10-CM | POA: Diagnosis not present

## 2023-04-29 DIAGNOSIS — E211 Secondary hyperparathyroidism, not elsewhere classified: Secondary | ICD-10-CM | POA: Diagnosis not present

## 2023-04-29 DIAGNOSIS — N189 Chronic kidney disease, unspecified: Secondary | ICD-10-CM | POA: Diagnosis not present

## 2023-04-29 DIAGNOSIS — D631 Anemia in chronic kidney disease: Secondary | ICD-10-CM | POA: Diagnosis not present

## 2023-05-08 DIAGNOSIS — I129 Hypertensive chronic kidney disease with stage 1 through stage 4 chronic kidney disease, or unspecified chronic kidney disease: Secondary | ICD-10-CM | POA: Diagnosis not present

## 2023-05-08 DIAGNOSIS — N1832 Chronic kidney disease, stage 3b: Secondary | ICD-10-CM | POA: Diagnosis not present

## 2023-05-08 DIAGNOSIS — E1129 Type 2 diabetes mellitus with other diabetic kidney complication: Secondary | ICD-10-CM | POA: Diagnosis not present

## 2023-05-08 DIAGNOSIS — E1122 Type 2 diabetes mellitus with diabetic chronic kidney disease: Secondary | ICD-10-CM | POA: Diagnosis not present

## 2023-06-03 DIAGNOSIS — L110 Acquired keratosis follicularis: Secondary | ICD-10-CM | POA: Diagnosis not present

## 2023-06-03 DIAGNOSIS — M79674 Pain in right toe(s): Secondary | ICD-10-CM | POA: Diagnosis not present

## 2023-06-03 DIAGNOSIS — E1140 Type 2 diabetes mellitus with diabetic neuropathy, unspecified: Secondary | ICD-10-CM | POA: Diagnosis not present

## 2023-06-03 DIAGNOSIS — M79675 Pain in left toe(s): Secondary | ICD-10-CM | POA: Diagnosis not present

## 2023-06-03 DIAGNOSIS — I739 Peripheral vascular disease, unspecified: Secondary | ICD-10-CM | POA: Diagnosis not present

## 2023-06-03 DIAGNOSIS — M79672 Pain in left foot: Secondary | ICD-10-CM | POA: Diagnosis not present

## 2023-06-03 DIAGNOSIS — M79671 Pain in right foot: Secondary | ICD-10-CM | POA: Diagnosis not present

## 2023-07-06 DIAGNOSIS — E1165 Type 2 diabetes mellitus with hyperglycemia: Secondary | ICD-10-CM | POA: Diagnosis not present

## 2023-07-21 DIAGNOSIS — R809 Proteinuria, unspecified: Secondary | ICD-10-CM | POA: Diagnosis not present

## 2023-07-21 DIAGNOSIS — D631 Anemia in chronic kidney disease: Secondary | ICD-10-CM | POA: Diagnosis not present

## 2023-07-21 DIAGNOSIS — E211 Secondary hyperparathyroidism, not elsewhere classified: Secondary | ICD-10-CM | POA: Diagnosis not present

## 2023-07-21 DIAGNOSIS — N189 Chronic kidney disease, unspecified: Secondary | ICD-10-CM | POA: Diagnosis not present

## 2023-07-24 DIAGNOSIS — C4441 Basal cell carcinoma of skin of scalp and neck: Secondary | ICD-10-CM | POA: Diagnosis not present

## 2023-07-24 DIAGNOSIS — C44712 Basal cell carcinoma of skin of right lower limb, including hip: Secondary | ICD-10-CM | POA: Diagnosis not present

## 2023-07-24 DIAGNOSIS — L821 Other seborrheic keratosis: Secondary | ICD-10-CM | POA: Diagnosis not present

## 2023-07-24 DIAGNOSIS — L570 Actinic keratosis: Secondary | ICD-10-CM | POA: Diagnosis not present

## 2023-07-24 DIAGNOSIS — X32XXXD Exposure to sunlight, subsequent encounter: Secondary | ICD-10-CM | POA: Diagnosis not present

## 2023-07-24 DIAGNOSIS — Z1283 Encounter for screening for malignant neoplasm of skin: Secondary | ICD-10-CM | POA: Diagnosis not present

## 2023-07-24 DIAGNOSIS — D225 Melanocytic nevi of trunk: Secondary | ICD-10-CM | POA: Diagnosis not present

## 2023-07-28 DIAGNOSIS — N1832 Chronic kidney disease, stage 3b: Secondary | ICD-10-CM | POA: Diagnosis not present

## 2023-07-28 DIAGNOSIS — E1129 Type 2 diabetes mellitus with other diabetic kidney complication: Secondary | ICD-10-CM | POA: Diagnosis not present

## 2023-07-28 DIAGNOSIS — I5032 Chronic diastolic (congestive) heart failure: Secondary | ICD-10-CM | POA: Diagnosis not present

## 2023-07-28 DIAGNOSIS — R809 Proteinuria, unspecified: Secondary | ICD-10-CM | POA: Diagnosis not present

## 2023-08-06 ENCOUNTER — Ambulatory Visit: Payer: Medicare Other | Attending: Cardiovascular Disease | Admitting: Cardiovascular Disease

## 2023-08-06 DIAGNOSIS — Z9861 Coronary angioplasty status: Secondary | ICD-10-CM | POA: Diagnosis not present

## 2023-08-06 DIAGNOSIS — I452 Bifascicular block: Secondary | ICD-10-CM

## 2023-08-06 DIAGNOSIS — I2583 Coronary atherosclerosis due to lipid rich plaque: Secondary | ICD-10-CM

## 2023-08-06 DIAGNOSIS — I2510 Atherosclerotic heart disease of native coronary artery without angina pectoris: Secondary | ICD-10-CM | POA: Diagnosis not present

## 2023-08-06 DIAGNOSIS — E785 Hyperlipidemia, unspecified: Secondary | ICD-10-CM | POA: Diagnosis not present

## 2023-08-06 DIAGNOSIS — N1832 Chronic kidney disease, stage 3b: Secondary | ICD-10-CM | POA: Diagnosis not present

## 2023-08-06 DIAGNOSIS — I10 Essential (primary) hypertension: Secondary | ICD-10-CM

## 2023-08-06 NOTE — Progress Notes (Signed)
 Patient ID: Maurice Ross, male   DOB: [birth year], 83 y.o.   MRN: [id number]      Primary MD: Dr. Belvie Boyers  HPI: Maurice Ross, is a 83 y.o. male who presents for a 9 month follow-up cardiology evaluation.  Maurice Ross has established CAD and in July 1997 suffered an anterior wall myocardial infarction and underwent PTCA/stenting of his LAD. Repeat intervention was done in 1998 due to in-stent restenosis. At that time he also had moderate disease in his circumflex and right coronary artery which medical therapy has been recommended. His last nuclear perfusion study was done in August 2013 which showed old scar in the apical septal and apical region. Ejection fraction was 57%. This study was unchanged from 2 years previously.  He has history of type 2 diabetes mellitus  He, his hemoglobin A1c had risen to 12.6 on 12/23/2013.  Over the past year he had seen Dr. Balin for endocrinologic evaluation.  Lab work by Dr. Gwen Lek on 01/13/2015 still demonstrated increased hemoglobin A1c at 10.6.  She discontinued his Actos, Glucophage, and Glucotrol, and he is now taking Humalog insulin .  He states that his blood sugars are better.  He is also seeing Dr. Tamera Falco who is following his kidney stones.  An echo Doppler study on 01/25/2014 showed an ejection fraction of 45-50% which was not significantly changed from his prior echo in September 2011.  There was grade 1 diastolic dysfunction.  There was evidence for aortic sclerosis.  There was mild pulmonic regurgitation.  He had increased stress  In 2016 with deaths of 3 of his family members including his father age 5, mother age 9, and mother-in-law on Christmas Eve age 69.    Since July 2017, he has had difficulty with kidney stones initially underwent a trial of lithotripsy but ultimately required surgical removal.  He developed allergic reaction to the anesthesia which led to hospitalization for 2 days in October 2017. An echo Doppler study was done during  that evaluation after he was found to have no troponin elevation.  He was found to have mild LVH with an EF of 50-55%.  There was mild hypokinesis of the mid distal anteroseptal wall, grade 1 diastolic dysfunction, mild LA enlargement, mild mitral annular calcification and he had normal pulmonary pressures.  I saw him in October 2018 and last saw him in April 2019. He denied any anginal symptomatology.  He was on lisinopril  5 mg, carvedilol  3.125 mg twice a day.  He was tolerating rosuvastatin  40 mg without side effects for hyperlipidemia.  He is diabetic on insulin . His hemoglobin A1c was 12.2 and in January 2019 his most recent hemoglobin A1c was 8.3.  He has not had any recurrent kidney stones October 2017.  During his last evaluation his blood pressure was well controlled.  I saw him in August 2020.  His primary MD had retired and he was seeing [employer] PA.  He establish care with Dr. Sherline Distel of endocrinology for his diabetes mellitus a  In November 2019 LDL cholesterol was 50 total cholesterol 102.  Post recent hemoglobin A1c on August 17, 2018 remained elevated at 8.7.  He has chronic kidney disease laboratory on September 14, 2018 showed a creatinine of 1.94.  He remained active and does yard work.  He denied chest pain PND orthopnea or awareness of palpitations.   I saw him on December 10, 2019.  Since his prior evaluation he continued to do well.  His blood pressure at home  was typically running in the 120s over 70s.  He was without angina, shortness of breath or palpitations and remained asymptomatic.   At times he believes that his balance may not be as good particularly on uneven ground.  He denies any dizziness or syncope.  He has stage IIIb CKD and creatinine in April 2021 had improved to 1.79.  I saw him on January 12, 2022.  He is followed by Alphonso Jean, PA for primary care since Dr Gwen Lek had retired.  He remains active and does gardening.  He believes he is sleeping well and typically goes  to bed at 10 PM and wakes up at 7 AM.  He remains active without chest pain, shortness of breath, or palpitations.  He sees Dr. Ballin for his diabetes mellitus.  In October 2021 creatinine had increased to 1.88 and in December 2021 creatinine was 1.93.  Hemoglobin A1c was 7.6 in July 2022.    When I saw him on May 01, 2022 he continued to feel well and remained active.  He denies chest pain or shortness of breath.  He sees Dr. Carrolyn Clan in [place] for his renal disease.  At times he notes some mild shortness of breath when he works excessively in the yard doing gardening.  He is unaware of palpitations, presyncope or syncope.  He continues to be on carvedilol  3.125 mg twice a day and lisinopril  5 mg daily for hypertension.  He is on rosuvastatin  40 mg for hyperlipidemia.  He is on Farxiga and Humulin insulin  for his diabetes mellitus and continues to take aspirin .  During that evaluation, most recent creatinine was 1.84 consistent with stage IIIb CKD.  I recommended that he have a follow-up echo Doppler study prior to his next evaluation since his previous assessment was in 2017.  I last saw him on November 08, 2018 for and he continued to feel well.    He denies shortness of breath or chest pain.  He had undergone an echo Doppler study on October 30, 2022 which showed low normal LV function with EF 50 to 55%.  There was mild grade 1 diastolic dysfunction, mild LA dilation and mild dilation of his ascending aorta at 43 mm.  Valves were essentially normal.  Recently has had issues with tingling and discomfort in his with him to eat.  He has been treated with antibiotic therapy and is scheduled to see an oral surgeon eating in the next several weeks.  At times he admits to trace ankle edema.  His primary MD, Dr. Belvie Boyers has retired.  He has established with Maurice Genin, PA at [facility].    Presently, Mr. Jezewski feels well.  He continues to live in [address].  He is on carvedilol  3.125 mg twice a day, takes  HCTZ on an as-needed basis for swelling, and is on lisinopril  5 mg for blood pressure control.  He continues to be on rosuvastatin  40 mg for lipid management.  He is diabetic on Humulin insulin  and Farxiga.  He presents for evaluation.   Past Medical History:  Diagnosis Date   Atypical mole 05/2015   right supra pubic mild atypia   Basal cell carcinoma 2000   right jawline curet 3 exc   Basal cell carcinoma 2017   left cheek cx3 24fu   Basal cell carcinoma 2019   left clavical cx3 42fu   CKD (chronic kidney disease), stage III (HCC)    Coronary artery disease CARDIOLOGIST-  DR Magnus Schuller   Coronary stent  1997, Last cath 1998, Last Nuc 10/24/2011-no change from previous, Last Echo 2011 EF 45%   Diabetes mellitus type 2, uncontrolled    per pt last A1c 8.8 in Aug 2017   GERD (gastroesophageal reflux disease)    History of acute anterior wall MI    1997   History of adenomatous polyp of colon    adenomatous 2004;   tubular adenoma 2013   History of basal cell carcinoma excision    05-24-2015  left cheek   History of esophageal dilatation    Hyperlipidemia    lipomet analysis LDL particle # at 786   Nodular basal cell carcinoma (BCC) 10/04/2020   Right Anterior Neck   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)    Renal calculus, right    Renal cyst    S/P coronary artery stent placement    1997  --  PCI  and stenting to LAD   SCC (squamous cell carcinoma) 2011   left neck tx with bx   SCC (squamous cell carcinoma) 2018   left forearm tx with bx   SCC (squamous cell carcinoma) 2018   right outer eyebore cx3 32fu   SCC (squamous cell carcinoma) 2018   right post neck cx3 78fu   SCCA (squamous cell carcinoma) of skin 10/04/2020   Right Malar Cheek (in situ)   SCCA (squamous cell carcinoma) of skin 10/04/2020   Right Sideburn (well diff)   Squamous cell carcinoma of skin 11/25/2019   IN SITU- left 2nd finger metacarpophalangeal joint (txpbx)   Wears  glasses    Wears hearing aid    bilateral    Past Surgical History:  Procedure Laterality Date   BALLOON DILATION  01/24/2012   Procedure: BALLOON DILATION;  Surgeon: Ruby Corporal, MD;  [facility];  Service: Endoscopy;  Laterality: N/A;   CARDIOVASCULAR STRESS TEST  10-24-2011   dr Loetta Ringer   Low risk nuclear study w/ mild to moderate perfustion defect due to infarct/scar w/  mild perinfarct ischemia in the apical septal and apical regions/  normal LV function and mild hypokinesis in the apical and apical septal (no sign. change compared to previous study()   CATARACT EXTRACTION W/ INTRAOCULAR LENS  IMPLANT, BILATERAL  2015   COLONOSCOPY N/A 04/10/2017   Procedure: COLONOSCOPY;  Surgeon: Ruby Corporal, MD;  [facility]: [facility];  Service: Endoscopy;  Laterality: N/A;  1115   COLONOSCOPY WITH ESOPHAGOGASTRODUODENOSCOPY (EGD)  01/24/2012   Procedure: COLONOSCOPY WITH ESOPHAGOGASTRODUODENOSCOPY (EGD);  Surgeon: Ruby Corporal, MD;  [facility]: [facility];  Service: Endoscopy;  Laterality: N/A;  955   CORONARY ANGIOPLASTY  06/08/1996   dr Magnus Schuller   Balloon angioplasty to  pLAD for in-stent restenosis, no change mLAD 30-50%/  narrowing  midRCA 40-50%/  mild ectasia of LCX w/ 30-50%  proximal and marginal narrowing/  mild LV dysfunction w/ anterolateral hypocontractility   CORONARY ANGIOPLASTY WITH STENT PLACEMENT  09-15-1995    dr Loetta Ringer   PTCA/ Stenting (palmaz-schatz)  to proximal LAD   CYSTOSCOPY WITH STENT PLACEMENT Right 01/01/2016   Procedure: CYSTOSCOPY WITH STENT PLACEMENT;  Surgeon: Trent Frizzle, MD;  Location: [facility];  Service: Urology;  Laterality: Right;   CYSTOSCOPY/RETROGRADE/URETEROSCOPY/STONE EXTRACTION WITH BASKET Right 01/01/2016   Procedure: CYSTOSCOPY/RETROGRADE/URETEROSCOPY;  Surgeon: Trent Frizzle, MD;  Location: [facility];  Service: Urology;  Laterality: Right;   EXTRACORPOREAL SHOCK WAVE LITHOTRIPSY   10-30-2015;   11-25-2011   HOLMIUM  LASER APPLICATION Right 01/01/2016   Procedure: HOLMIUM LASER APPLICATION;  Surgeon: Trent Frizzle, MD;  Location: [facility];  Service: Urology;  Laterality: Right;   LUMBAR MICRODISCECTOMY  1998   L5 -- S1   MALONEY DILATION  01/24/2012   Procedure: MALONEY DILATION;  Surgeon: Ruby Corporal, MD;  [facility];  Service: Endoscopy;  Laterality: N/A;   POLYPECTOMY  04/10/2017   Procedure: POLYPECTOMY;  Surgeon: Ruby Corporal, MD;  [facility];  Service: Endoscopy;;   SAVORY DILATION  01/24/2012   Procedure: SAVORY DILATION;  Surgeon: Ruby Corporal, MD;  [facility];  Service: Endoscopy;  Laterality: N/A;   TRANSTHORACIC ECHOCARDIOGRAM  01/25/2014   mild concentric LVH, ef 45-50%, grade 1 diastolic dysfunction/  mild PR    Allergies  Allergen Reactions   Other     General anesthesia - patient reports when he had his kidney surgery around 2018     Current Outpatient Medications  Medication Sig Dispense Refill   aspirin  81 MG tablet Take 1 tablet (81 mg total) by mouth daily. 30 tablet    carvedilol  (COREG ) 3.125 MG tablet Take 1 tablet by mouth two  times daily 180 tablet 1   dapagliflozin propanediol (FARXIGA) 5 MG TABS tablet Take 5 mg by mouth daily.     fluorouracil  (EFUDEX ) 5 % cream Apply topically daily. apply qhs x 2 weeds 40 g 0   HUMULIN 70/30 (70-30) 100 UNIT/ML injection Inject into the skin.     hydrochlorothiazide  (HYDRODIURIL ) 25 MG tablet TAKE 1 TABLET BY MOUTH DAILY AS  NEEDED FOR SWELLING 90 tablet 2   lisinopril  (PRINIVIL ,ZESTRIL ) 5 MG tablet Take 5 mg by mouth daily.      ONETOUCH VERIO test strip 1 each 3 (three) times daily.     ranitidine (ZANTAC) 150 MG tablet Take 150 mg by mouth daily.      rosuvastatin  (CRESTOR ) 40 MG tablet Take 40 mg by mouth every evening.      No current facility-administered medications for this visit.    Social History   Socioeconomic  History   Marital status: Married    Spouse name: Lenon Radar   Number of children: 2   Years of education: Not on file   Highest education level: Not on file  Occupational History   Occupation: retired Scientist, product/process development  Tobacco Use   Smoking status: Former    Types: Pipe, Cigars    Quit date: 12/27/2011    Years since quitting: 11.6   Smokeless tobacco: Never  Vaping Use   Vaping status: Never Used  Substance and Sexual Activity   Alcohol use: No    Alcohol/week: 0.0 standard drinks of alcohol   Drug use: No   Sexual activity: Not on file  Other Topics Concern   Not on file  Social History Narrative   Not on file   Social Drivers of Health   Financial Resource Strain: Not on file  Food Insecurity: Not on file  Transportation Needs: Not on file  Physical Activity: Not on file  Stress: Not on file  Social Connections: Not on file  Intimate Partner Violence: Not on file    Family History  Problem Relation Age of Onset   Heart disease Mother 63   Heart disease Father 37   Healthy Son    Healthy Son    Social history is notable that he is married has 2 children. He does not routinely exercise. There is  no tobacco or alcohol use.   ROS General: Negative; No fevers, chills, or night sweats;  HEENT: Wisdom teeth issue;  no changes in vision or hearing, sinus congestion, difficulty swallowing Pulmonary: Negative; No cough, wheezing, shortness of breath, hemoptysis Cardiovascular: See HPI GI: positive for GERD; No nausea, vomiting, diarrhea, or abdominal pain GU: Positive for kidney stones; No dysuria, hematuria, or difficulty voiding Musculoskeletal: Negative; no myalgias, joint pain, or weakness Hematologic/Oncology: Negative; no easy bruising, bleeding Endocrine: positive for poorly controlled diabetes mellitus.  No thyroid  abnormalities. Neuro: Negative; no changes in balance, headaches Skin: Negative; No rashes or skin lesions Psychiatric: Negative; No behavioral  problems, depression Sleep: Negative; No snoring, daytime sleepiness, hypersomnolence, bruxism, restless legs, hypnogognic hallucinations, no cataplexy Other comprehensive 14 point system review is negative.   PE BP 112/64   Pulse 63   Ht 5\' 7"  (1.702 m)   Wt 184 lb 6.4 oz (83.6 kg)   SpO2 96%   BMI 28.88 kg/m    Repeat blood pressure by me was 120/68  Wt Readings from Last 3 Encounters:  08/06/23 184 lb 6.4 oz (83.6 kg)  11/08/22 205 lb (93 kg)  05/01/22 200 lb 3.2 oz (90.8 kg)   General: Alert, oriented, no distress.  Skin: normal turgor, no rashes, warm and dry HEENT: Normocephalic, atraumatic. Pupils equal round and reactive to light; sclera anicteric; extraocular muscles intact;  Nose without nasal septal hypertrophy Mouth/Parynx benign; Mallinpatti scale 3 Neck: No JVD, no carotid bruits; normal carotid upstroke Lungs: clear to ausculatation and percussion; no wheezing or rales Chest wall: without tenderness to palpitation Heart: PMI not displaced, RRR, s1 s2 normal, 1/6 systolic murmur, no diastolic murmur, no rubs, gallops, thrills, or heaves Abdomen: soft, nontender; no hepatosplenomehaly, BS+; abdominal aorta nontender and not dilated by palpation. Back: no CVA tenderness Pulses 2+ Musculoskeletal: full range of motion, normal strength, no joint deformities Extremities: Trace ankle edema; no clubbing cyanosis, Homan's sign negative  Neurologic: grossly nonfocal; Cranial nerves grossly wnl Psychologic: Normal mood and affect     EKG Interpretation Date/Time:  Wednesday Aug 06 2023 13:15:53 EDT Ventricular Rate:  63 PR Interval:  150 QRS Duration:  138 QT Interval:  422 QTC Calculation: 431 R Axis:   -70  Text Interpretation: Sinus rhythm with Premature atrial complexes Left axis deviation Right bundle branch block Septal infarct (cited on or before 08-Nov-2022) When compared with ECG of 08-Nov-2022 12:57, Premature atrial complexes are now Present  Questionable change in initial forces of Septal leads Nonspecific T wave abnormality, improved in Inferior leads Nonspecific T wave abnormality, improved in Anterior leads Confirmed by Magnus Schuller ([id number]) on 08/06/2023 2:55:07 PM    EKG Interpretation Date/Time:  Friday November 08 2022 12:57:40 EDT Ventricular Rate:  53 PR Interval:  152 QRS Duration:  144 QT Interval:  442 QTC Calculation: 414 R Axis:   -70  Text Interpretation: Sinus bradycardia Right bundle branch block Left anterior fascicular block Bifascicular block  Septal infarct , age undetermined When compared with ECG of 08-Jan-2016 09:52, PREVIOUS ECG IS PRESENT Confirmed by Magnus Schuller ([id number]) on 11/08/2022 1:12:54 PM    May 01, 2022 ECG (independently read by me): Sinus rhythm at 61, PAC, RBBB, LAHB, LVH   January 12, 2021 ECG (independently read by me):  Sinus bradycardia at 56, RBBB, LAHB, no ectopy, normal intervals  December 10, 2019 ECG (independently read by me): Normal sinus rhythm at 62 bpm.  Right bundle branch block, left anterior hemiblock.  QTc interval 456  ms.  August 2020 ECG (independently read by me): Sinus bradycardia 54 bpm with mild sinus arrhythmia, right bundle branch block with repolarization changes, left anterior hemiblock.  April 2019 ECG (independently read by me): Sinus bradycardia 56 bpm.  Right bundle branch block with repolarization changes.  Left anterior hemiblock.  Normal intervals.  No ectopy.  October 2018 ECG (independently read by me): normal sinus rhythm at 66 bpm.  Right bundle-branch block with repolarization changes.  Left anterior hemiblock.  Normal intervals.  March 2018 ECG (independently read by me): Sinus Bradycardia 56 bpm, right bundle branch block, left anterior hemiblock, LVH.  January 2017 ECG (independently read by me): Normal sinus rhythm at 60 bpm.  Right bundle-branch block and left anterior hemiblock consistent with bifascicular block.  QTc interval 432  ms.  February 2016 ECG (independently read by me): Sinus bradycardia 56 bpm.  Right bundle-branch block with repolarization changes.  Septal Q wave in V1.  November 2015 ECG (independently read by me): Marked sinus bradycardia at 44 bpm.  Right bundle branch block.  Probable left anterior fascicular block. Small septal Q waves  October 2014 ECG: Sinus bradycardia with right bundle branch block. Small Q waves in leads V1 and V2 concord with septal infarct that is old. Nonspecific T changes laterally.  LABS:  Viewed recent November 2018 laboratory by Dr. Gwen Lek.  Total cholesterol 118, HDL 36, LDL 61, triglycerides 105.  Globin 17.3.  Creatinine 1.87. HbA1c 8.3     Latest Ref Rng & Units 01/12/2021   10:09 AM 12/22/2019   11:08 AM 12/10/2019    9:32 AM  BMP  Glucose 70 - 99 mg/dL 161  096  045   BUN 8 - 27 mg/dL 14  18  19    Creatinine 0.76 - 1.27 mg/dL 4.09  8.11  9.14   BUN/Creat Ratio 10 - 24 9  10  10    Sodium 134 - 144 mmol/L 141  141  140   Potassium 3.5 - 5.2 mmol/L 4.8  5.1  5.5   Chloride 96 - 106 mmol/L 106  107  104   CO2 20 - 29 mmol/L 21  23  24    Calcium  8.6 - 10.2 mg/dL 9.2  9.6  9.9       Latest Ref Rng & Units 01/12/2021   10:09 AM 12/10/2019    9:32 AM 01/09/2016    3:10 AM  Hepatic Function  Total Protein 6.0 - 8.5 g/dL 5.9  6.3  5.6   Albumin 3.7 - 4.7 g/dL 4.1  4.2  2.4   AST 0 - 40 IU/L 18  16  79   ALT 0 - 44 IU/L 17  23  123   Alk Phosphatase 44 - 121 IU/L 61  65  67   Total Bilirubin 0.0 - 1.2 mg/dL 0.4  0.4  0.9       Latest Ref Rng & Units 02/15/2022   10:24 AM 01/12/2021   10:09 AM 12/10/2019    9:32 AM  CBC  WBC 3.4 - 10.8 x10E3/uL  7.6  6.1   Hemoglobin 13.0 - 17.7 g/dL 78.2  95.6  21.3   Hematocrit 37.5 - 51.0 %  42.0  46.3   Platelets 150 - 450 x10E3/uL  188  160    Lab Results  Component Value Date   MCV 77 (L) 01/12/2021   MCV 82 12/10/2019   MCV 84.2 01/08/2016   Lab Results  Component Value Date   TSH 1.870 01/12/2021  Lab  Results  Component Value Date   HGBA1C 8.7 (H) 01/12/2021   Lipid Panel     Component Value Date/Time   CHOL 108 01/12/2021 1009   TRIG 124 01/12/2021 1009   HDL 40 01/12/2021 1009   CHOLHDL 2.7 01/12/2021 1009   CHOLHDL 3.1 11/30/2013 0847   VLDL 27 11/30/2013 0847   LDLCALC 46 01/12/2021 1009     RADIOLOGY:   ECHO: 10/30/2022  1. Left ventricular ejection fraction, by estimation, is 50 to 55%. The  left ventricle has low normal function. Left ventricular endocardial  border not optimally defined to evaluate regional wall motion. Left  ventricular diastolic parameters are  consistent with Grade I diastolic dysfunction (impaired relaxation).   2. Right ventricular systolic function is normal. The right ventricular  size is normal.   3. Left atrial size was mildly dilated.   4. The mitral valve is normal in structure. No evidence of mitral valve  regurgitation. No evidence of mitral stenosis.   5. The aortic valve is tricuspid. Aortic valve regurgitation is trivial.  No aortic stenosis is present.   6. There is mild dilatation of the ascending aorta, measuring 43 mm.   7. The inferior vena cava is normal in size with greater than 50%  respiratory variability, suggesting right atrial pressure of 3 mmHg.   IMPRESSION:  1. Coronary artery disease due to lipid rich plaque   2. History of percutaneous coronary intervention: 1997, 1998   3. Essential hypertension   4. Bifascicular block   5. Hyperlipidemia LDL goal <55.   6. Stage 3b chronic kidney disease (HCC)     ASSESSMENT AND PLAN: Mr. Colston Pyle is an 83 year-old Caucasian male who suffered an anterior wall myocardial infarction in 1997 and underwent initial intervention to his LAD and required repeat intervention secondary to restenosis in 1998. He has concomitant CAD and has been on medical therapy and fortunately over these many years has remained stable.  A nuclear perfusion study in August 2013 was unchanged from  2011 and only showed a very small region of abnormality in the apical septal region. His  echo Doppler study in October 2017 showed an EF of 50-55% with mild LVH and mild hypokinesis of the mid to distal anteroseptal wall with grade 1 diastolic dysfunction.  There was mild LA enlargement, mild mitral annular calcification, and trivial tricuspid regurgitation with normal pulmonary pressure.  He underwent a 7-year follow-up echo Doppler study on October 30, 2022.  This showed low normal LV function with EF 50 to 55%, there was mild grade 1 diastolic dysfunction, mild LA dilation, and mild dilation of ascending aorta at 43 mm.  Valves were essentially normal.  Clinically he continues to be stable.  When seen at his last visit in August 2024 he was given a prescription for HCTZ to take 12.5 mg on a as needed basis for ankle swelling.  He is now on rosuvastatin  40 mg daily for lipid therapy.  His primary MD checks laboratory and I do not have its most recent results.  Previously, LDL cholesterol had been 50.  He is followed by Dr. Ballin for his diabetes.  I discussed with him my upcoming retirement.  I will transition him to the cardiology care of Dr. Randene Bustard and arrange follow-up in 8 to 9 months.  It is certainly been a pleasure to take care of him for these past 28 years.   Millicent Ally, MD, FACC  08/07/2023 8:22 AM

## 2023-08-06 NOTE — Patient Instructions (Signed)
 Medication Instructions:  Your physician recommends that you continue on your current medications as directed. Please refer to the Current Medication list given to you today. *If you need a refill on your cardiac medications before your next appointment, please call your pharmacy*  Lab Work: NONE ORDERED If you have labs (blood work) drawn today and your tests are completely normal, you will receive your results only by: MyChart Message (if you have MyChart) OR A paper copy in the mail If you have any lab test that is abnormal or we need to change your treatment, we will call you to review the results.  Testing/Procedures: NONE ORDERED  Follow-Up: At [facility], you and your health needs are our priority.  As part of our continuing mission to provide you with exceptional heart care, our providers are all part of one team.  This team includes your primary Cardiologist (physician) and Advanced Practice Providers or APPs (Physician Assistants and Nurse Practitioners) who all work together to provide you with the care you need, when you need it.  Your next appointment:   9 month(s)  Provider:   Randene Bustard, MD    We recommend signing up for the patient portal called "MyChart".  Sign up information is provided on this After Visit Summary.  MyChart is used to connect with patients for Virtual Visits (Telemedicine).  Patients are able to view lab/test results, encounter notes, upcoming appointments, etc.  Non-urgent messages can be sent to your provider as well.   To learn more about what you can do with MyChart, go to [email or web address].   Other Instructions

## 2023-08-07 ENCOUNTER — Encounter: Payer: Self-pay | Admitting: Cardiovascular Disease

## 2023-08-12 DIAGNOSIS — M79674 Pain in right toe(s): Secondary | ICD-10-CM | POA: Diagnosis not present

## 2023-08-12 DIAGNOSIS — M79672 Pain in left foot: Secondary | ICD-10-CM | POA: Diagnosis not present

## 2023-08-12 DIAGNOSIS — L110 Acquired keratosis follicularis: Secondary | ICD-10-CM | POA: Diagnosis not present

## 2023-08-12 DIAGNOSIS — M79675 Pain in left toe(s): Secondary | ICD-10-CM | POA: Diagnosis not present

## 2023-08-12 DIAGNOSIS — M79671 Pain in right foot: Secondary | ICD-10-CM | POA: Diagnosis not present

## 2023-08-12 DIAGNOSIS — E1140 Type 2 diabetes mellitus with diabetic neuropathy, unspecified: Secondary | ICD-10-CM | POA: Diagnosis not present

## 2023-08-12 DIAGNOSIS — I739 Peripheral vascular disease, unspecified: Secondary | ICD-10-CM | POA: Diagnosis not present

## 2023-08-27 DIAGNOSIS — S70361A Insect bite (nonvenomous), right thigh, initial encounter: Secondary | ICD-10-CM | POA: Diagnosis not present

## 2023-08-28 DIAGNOSIS — Z08 Encounter for follow-up examination after completed treatment for malignant neoplasm: Secondary | ICD-10-CM | POA: Diagnosis not present

## 2023-08-28 DIAGNOSIS — Z85828 Personal history of other malignant neoplasm of skin: Secondary | ICD-10-CM | POA: Diagnosis not present

## 2023-10-01 DIAGNOSIS — E7800 Pure hypercholesterolemia, unspecified: Secondary | ICD-10-CM | POA: Diagnosis not present

## 2023-10-01 DIAGNOSIS — I10 Essential (primary) hypertension: Secondary | ICD-10-CM | POA: Diagnosis not present

## 2023-10-01 DIAGNOSIS — E1165 Type 2 diabetes mellitus with hyperglycemia: Secondary | ICD-10-CM | POA: Diagnosis not present

## 2023-10-01 DIAGNOSIS — N189 Chronic kidney disease, unspecified: Secondary | ICD-10-CM | POA: Diagnosis not present

## 2023-10-04 DIAGNOSIS — E1165 Type 2 diabetes mellitus with hyperglycemia: Secondary | ICD-10-CM | POA: Diagnosis not present

## 2023-10-21 DIAGNOSIS — M79674 Pain in right toe(s): Secondary | ICD-10-CM | POA: Diagnosis not present

## 2023-10-21 DIAGNOSIS — M79672 Pain in left foot: Secondary | ICD-10-CM | POA: Diagnosis not present

## 2023-10-21 DIAGNOSIS — I739 Peripheral vascular disease, unspecified: Secondary | ICD-10-CM | POA: Diagnosis not present

## 2023-10-21 DIAGNOSIS — M79675 Pain in left toe(s): Secondary | ICD-10-CM | POA: Diagnosis not present

## 2023-10-21 DIAGNOSIS — L110 Acquired keratosis follicularis: Secondary | ICD-10-CM | POA: Diagnosis not present

## 2023-10-21 DIAGNOSIS — M79671 Pain in right foot: Secondary | ICD-10-CM | POA: Diagnosis not present

## 2023-10-21 DIAGNOSIS — E1140 Type 2 diabetes mellitus with diabetic neuropathy, unspecified: Secondary | ICD-10-CM | POA: Diagnosis not present

## 2023-11-29 DIAGNOSIS — Z23 Encounter for immunization: Secondary | ICD-10-CM | POA: Diagnosis not present

## 2023-12-10 DIAGNOSIS — H40059 Ocular hypertension, unspecified eye: Secondary | ICD-10-CM | POA: Diagnosis not present

## 2023-12-10 DIAGNOSIS — E113553 Type 2 diabetes mellitus with stable proliferative diabetic retinopathy, bilateral: Secondary | ICD-10-CM | POA: Diagnosis not present

## 2023-12-10 DIAGNOSIS — Z961 Presence of intraocular lens: Secondary | ICD-10-CM | POA: Diagnosis not present

## 2023-12-31 DIAGNOSIS — E1165 Type 2 diabetes mellitus with hyperglycemia: Secondary | ICD-10-CM | POA: Diagnosis not present

## 2024-01-01 DIAGNOSIS — M79675 Pain in left toe(s): Secondary | ICD-10-CM | POA: Diagnosis not present

## 2024-01-01 DIAGNOSIS — E1140 Type 2 diabetes mellitus with diabetic neuropathy, unspecified: Secondary | ICD-10-CM | POA: Diagnosis not present

## 2024-01-01 DIAGNOSIS — M79671 Pain in right foot: Secondary | ICD-10-CM | POA: Diagnosis not present

## 2024-01-01 DIAGNOSIS — M79674 Pain in right toe(s): Secondary | ICD-10-CM | POA: Diagnosis not present

## 2024-01-01 DIAGNOSIS — L110 Acquired keratosis follicularis: Secondary | ICD-10-CM | POA: Diagnosis not present

## 2024-01-01 DIAGNOSIS — M79672 Pain in left foot: Secondary | ICD-10-CM | POA: Diagnosis not present

## 2024-01-01 DIAGNOSIS — I739 Peripheral vascular disease, unspecified: Secondary | ICD-10-CM | POA: Diagnosis not present

## 2024-01-02 DIAGNOSIS — E1165 Type 2 diabetes mellitus with hyperglycemia: Secondary | ICD-10-CM | POA: Diagnosis not present

## 2024-01-06 DIAGNOSIS — I10 Essential (primary) hypertension: Secondary | ICD-10-CM | POA: Diagnosis not present

## 2024-01-06 DIAGNOSIS — E1165 Type 2 diabetes mellitus with hyperglycemia: Secondary | ICD-10-CM | POA: Diagnosis not present

## 2024-01-06 DIAGNOSIS — N189 Chronic kidney disease, unspecified: Secondary | ICD-10-CM | POA: Diagnosis not present

## 2024-01-06 DIAGNOSIS — E7800 Pure hypercholesterolemia, unspecified: Secondary | ICD-10-CM | POA: Diagnosis not present

## 2024-01-22 ENCOUNTER — Encounter (HOSPITAL_COMMUNITY): Payer: Self-pay | Admitting: Emergency Medicine

## 2024-01-22 ENCOUNTER — Other Ambulatory Visit: Payer: Self-pay

## 2024-01-22 ENCOUNTER — Emergency Department (HOSPITAL_COMMUNITY)
Admission: EM | Admit: 2024-01-22 | Discharge: 2024-01-22 | Disposition: A | Discharge status: Home / Self Care | Attending: Emergency Medicine | Admitting: Emergency Medicine

## 2024-01-22 ENCOUNTER — Emergency Department (HOSPITAL_COMMUNITY)

## 2024-01-22 DIAGNOSIS — Z7982 Long term (current) use of aspirin: Secondary | ICD-10-CM | POA: Diagnosis not present

## 2024-01-22 DIAGNOSIS — Z794 Long term (current) use of insulin: Secondary | ICD-10-CM | POA: Insufficient documentation

## 2024-01-22 DIAGNOSIS — R109 Unspecified abdominal pain: Secondary | ICD-10-CM

## 2024-01-22 DIAGNOSIS — I2510 Atherosclerotic heart disease of native coronary artery without angina pectoris: Secondary | ICD-10-CM | POA: Diagnosis not present

## 2024-01-22 DIAGNOSIS — E1122 Type 2 diabetes mellitus with diabetic chronic kidney disease: Secondary | ICD-10-CM | POA: Insufficient documentation

## 2024-01-22 DIAGNOSIS — R1084 Generalized abdominal pain: Secondary | ICD-10-CM | POA: Diagnosis not present

## 2024-01-22 DIAGNOSIS — N189 Chronic kidney disease, unspecified: Secondary | ICD-10-CM | POA: Insufficient documentation

## 2024-01-22 LAB — URINALYSIS, ROUTINE W REFLEX MICROSCOPIC
Bacteria, UA: NONE SEEN
Bilirubin Urine: NEGATIVE
Glucose, UA: >=500 mg/dL — AB
Hgb urine dipstick: NEGATIVE
Ketones, ur: NEGATIVE mg/dL
Leukocytes,Ua: NEGATIVE
Nitrite: NEGATIVE
Protein, ur: 30 mg/dL — AB
Specific Gravity, Urine: 1.022 (ref 1.005–1.030)
pH: 5 (ref 5.0–8.0)

## 2024-01-22 LAB — CBC WITH DIFFERENTIAL/PLATELET
Abs Immature Granulocytes: 0.01 K/uL (ref 0.00–0.07)
Basophils Absolute: 0.1 K/uL (ref 0.0–0.1)
Basophils Relative: 1 %
Eosinophils Absolute: 0.4 K/uL (ref 0.0–0.5)
Eosinophils Relative: 5 %
HCT: 41.5 % (ref 39.0–52.0)
Hemoglobin: 12.8 g/dL — ABNORMAL LOW (ref 13.0–17.0)
Immature Granulocytes: 0 %
Lymphocytes Relative: 24 %
Lymphs Abs: 1.8 K/uL (ref 0.7–4.0)
MCH: 23.3 pg — ABNORMAL LOW (ref 26.0–34.0)
MCHC: 30.8 g/dL (ref 30.0–36.0)
MCV: 75.6 fL — ABNORMAL LOW (ref 80.0–100.0)
Monocytes Absolute: 0.6 K/uL (ref 0.1–1.0)
Monocytes Relative: 8 %
Neutro Abs: 4.7 K/uL (ref 1.7–7.7)
Neutrophils Relative %: 62 %
Platelets: 197 K/uL (ref 150–400)
RBC: 5.49 MIL/uL (ref 4.22–5.81)
RDW: 18.9 % — ABNORMAL HIGH (ref 11.5–15.5)
WBC: 7.6 K/uL (ref 4.0–10.5)
nRBC: 0 % (ref 0.0–0.2)

## 2024-01-22 LAB — COMPREHENSIVE METABOLIC PANEL WITH GFR
ALT: 21 U/L (ref 0–44)
AST: 22 U/L (ref 15–41)
Albumin: 4.1 g/dL (ref 3.5–5.0)
Alkaline Phosphatase: 61 U/L (ref 38–126)
Anion gap: 10 (ref 5–15)
BUN: 22 mg/dL (ref 8–23)
CO2: 22 mmol/L (ref 22–32)
Calcium: 9.3 mg/dL (ref 8.9–10.3)
Chloride: 105 mmol/L (ref 98–111)
Creatinine, Ser: 1.88 mg/dL — ABNORMAL HIGH (ref 0.61–1.24)
GFR, Estimated: 35 mL/min — ABNORMAL LOW (ref >60)
Glucose, Bld: 280 mg/dL — ABNORMAL HIGH (ref 70–99)
Potassium: 4.3 mmol/L (ref 3.5–5.1)
Sodium: 138 mmol/L (ref 135–145)
Total Bilirubin: 0.3 mg/dL (ref 0.0–1.2)
Total Protein: 6.6 g/dL (ref 6.5–8.1)

## 2024-01-22 LAB — LIPASE, BLOOD: Lipase: 40 U/L (ref 11–51)

## 2024-01-22 MED ORDER — MORPHINE SULFATE (PF) 4 MG/ML IV SOLN
4.0000 mg | Freq: Once | INTRAVENOUS | Status: DC
  Filled 2024-01-22: qty 1

## 2024-01-22 MED ORDER — IOHEXOL 300 MG/ML  SOLN
100.0000 mL | Freq: Once | INTRAMUSCULAR | Status: AC | PRN
  Administered 2024-01-22: 100 mL via INTRAVENOUS

## 2024-01-22 MED ORDER — ONDANSETRON HCL 4 MG/2ML IJ SOLN
4.0000 mg | Freq: Once | INTRAMUSCULAR | Status: AC
  Administered 2024-01-22: 4 mg via INTRAVENOUS
  Filled 2024-01-22: qty 2

## 2024-01-22 MED ORDER — SODIUM CHLORIDE 0.9 % IV BOLUS
500.0000 mL | Freq: Once | INTRAVENOUS | Status: AC
  Administered 2024-01-22: 500 mL via INTRAVENOUS

## 2024-01-22 NOTE — ED Triage Notes (Signed)
 Lower abd pain on and off x 2 weeks. Went to PCP Had US  a few days ago. Pt was told he had gallstones. Pt followed up with PCP today and had blood work taken. Pt states the pain was bad and didn't want to wait til Monday to get results. Pt also states he has a hernia and unsure if that is also contributing to the pain.

## 2024-01-22 NOTE — ED Provider Notes (Signed)
 [place] EMERGENCY DEPARTMENT AT [facility] Provider Note   CSN: [id number] Arrival date & time: 01/22/24  1336     Patient presents with: Abdominal Pain  HPI Maurice Ross is a 83 y.o. male with CAD s/p stent, GERD, DM, CKD, presenting for abdominal pain.  Has been going on intermittently for 2 weeks.  Pain is primarily about the mid abdomen.  Patient went to PCP few days ago and underwent ultrasound which he was told today that he has gallstones.  Endorses nausea and some intermittent diarrhea but no vomiting.  Also reports that he may have a hernia in concern that could be contributing to his pain.  He denies fever at home.  Denies chest pain or shortness of breath.  Denies urinary symptoms.    Abdominal Pain      Prior to Admission medications   Medication Sig Start Date End Date Taking? Authorizing Provider  aspirin  81 MG tablet Take 1 tablet (81 mg total) by mouth daily. 04/11/17   Golda Claudis PENNER, MD  carvedilol  (COREG ) 3.125 MG tablet Take 1 tablet by mouth two  times daily 01/04/15   Burnard Debby LABOR, MD  dapagliflozin propanediol (FARXIGA) 5 MG TABS tablet Take 5 mg by mouth daily.    [provider]  fluorouracil  (EFUDEX ) 5 % cream Apply topically daily. apply qhs x 2 weeds 11/25/19   Sheffield, Kelli R, PA-C  HUMULIN 70/30 (70-30) 100 UNIT/ML injection Inject into the skin. 11/09/19   [provider]  hydrochlorothiazide  (HYDRODIURIL ) 25 MG tablet TAKE 1 TABLET BY MOUTH DAILY AS  NEEDED FOR SWELLING 01/16/23   Burnard Debby LABOR, MD  lisinopril  (PRINIVIL ,ZESTRIL ) 5 MG tablet Take 5 mg by mouth daily.  01/18/15   [provider]  AISHA VERIO test strip 1 each 3 (three) times daily. 11/25/19   [provider]  ranitidine (ZANTAC) 150 MG tablet Take 150 mg by mouth daily.     [provider]  rosuvastatin  (CRESTOR ) 40 MG tablet Take 40 mg by mouth every evening.     [provider]    Allergies: Other    Review of  Systems  Gastrointestinal:  Positive for abdominal pain.    Updated Vital Signs BP 138/67   Pulse 61   Temp (!) 97.5 F (36.4 C)   Resp 18   SpO2 97%   Physical Exam Vitals and nursing note reviewed.  HENT:     Head: Normocephalic and atraumatic.     Mouth/Throat:     Mouth: Mucous membranes are moist.  Eyes:     General:        Right eye: No discharge.        Left eye: No discharge.     Conjunctiva/sclera: Conjunctivae normal.  Cardiovascular:     Rate and Rhythm: Normal rate and regular rhythm.     Pulses: Normal pulses.     Heart sounds: Normal heart sounds.  Pulmonary:     Effort: Pulmonary effort is normal.     Breath sounds: Normal breath sounds.  Abdominal:     General: Abdomen is flat.     Palpations: Abdomen is soft.     Tenderness: There is generalized abdominal tenderness.  Skin:    General: Skin is warm and dry.  Neurological:     General: No focal deficit present.  Psychiatric:        Mood and Affect: Mood normal.     (all labs ordered are listed, but only  abnormal results are displayed) Labs Reviewed  COMPREHENSIVE METABOLIC PANEL WITH GFR - Abnormal; Notable for the following components:      Result Value   Glucose, Bld 280 (*)    Creatinine, Ser 1.88 (*)    GFR, Estimated 35 (*)    All other components within normal limits  CBC WITH DIFFERENTIAL/PLATELET - Abnormal; Notable for the following components:   Hemoglobin 12.8 (*)    MCV 75.6 (*)    MCH 23.3 (*)    RDW 18.9 (*)    All other components within normal limits  URINALYSIS, ROUTINE W REFLEX MICROSCOPIC - Abnormal; Notable for the following components:   Color, Urine STRAW (*)    Glucose, UA >=500 (*)    Protein, ur 30 (*)    All other components within normal limits  LIPASE, BLOOD    EKG: None  Radiology: CT ABDOMEN PELVIS W CONTRAST Result Date: 01/22/2024 CLINICAL DATA:  Abdominal pain. EXAM: CT ABDOMEN AND PELVIS WITH CONTRAST TECHNIQUE: Multidetector CT imaging of the  abdomen and pelvis was performed using the standard protocol following bolus administration of intravenous contrast. RADIATION DOSE REDUCTION: This exam was performed according to the departmental dose-optimization program which includes automated exposure control, adjustment of the mA and/or kV according to patient size and/or use of iterative reconstruction technique. CONTRAST:  OMNIPAQUE  IOHEXOL  300 MG/ML  SOLN COMPARISON:  CT abdomen pelvis dated 01/08/2016. FINDINGS: Lower chest: The visualized lung bases are clear. There is advanced coronary vascular calcification. No intra-abdominal free air or free fluid. Hepatobiliary: The liver is unremarkable. No biliary dilatation. Small gallstones. No pericholecystic fluid or evidence of acute cholecystitis by CT. Pancreas: Small pancreatic cyst predominantly involving the distal pancreas. Mild atrophy of the pancreas with dilatation of the main pancreatic duct distally. The cystic lesions are not characterized, possibly side branch IPMNs. These can be better evaluated with MRI without and with contrast on a nonemergent/outpatient basis. No active inflammatory changes. Spleen: Normal in size without focal abnormality. Adrenals/Urinary Tract: The adrenal glands unremarkable. Mild bilateral renal parenchyma atrophy. Small bilateral renal cysts. Two stones noted in the right renal pelvis with the larger measuring approximately 7 mm. There is no hydronephrosis on either side. The visualized ureters and urinary bladder appear unremarkable. Stomach/Bowel: There is sigmoid diverticulosis. There is no bowel obstruction or active inflammation. The appendix is normal. Vascular/Lymphatic: Advanced aortoiliac atherosclerotic disease. The IVC is unremarkable. No portal venous gas. There is no adenopathy. Reproductive: Enlarged prostate gland measuring 7 cm in transverse axial diameter. Other: Small fat containing umbilical hernia. Musculoskeletal: Osteopenia with degenerative  changes of the spine. No acute osseous pathology. IMPRESSION: 1. No acute intra-abdominal or pelvic pathology. 2. Cholelithiasis. 3. Sigmoid diverticulosis. No bowel obstruction. Normal appendix. 4. Two stones in the right renal pelvis with the larger measuring approximately 7 mm. No hydronephrosis on either side. 5. Enlarged prostate gland. 6.  Aortic Atherosclerosis (ICD10-I70.0). Electronically Signed   By: Vanetta Chou M.D.   On: 01/22/2024 18:21     Procedures   Medications Ordered in the ED  ondansetron  (ZOFRAN ) injection 4 mg (4 mg Intravenous Given 01/22/24 1636)  sodium chloride  0.9 % bolus 500 mL (0 mLs Intravenous Stopped 01/22/24 1827)  iohexol  (OMNIPAQUE ) 300 MG/ML solution 100 mL (100 mLs Intravenous Contrast Given 01/22/24 1804)  Medical Decision Making Amount and/or Complexity of Data Reviewed Radiology: ordered.  Risk Prescription drug management.   Initial Impression and Ddx 83 year old well-appearing male presenting for abdominal pain.  Exam notable for generalized abdominal tenderness.  DDx includes acute cholecystitis, kidney stone, pyelonephritis, acute pancreatitis, bowel obstruction, symptomatic cholelithiasis, other. Patient PMH that increases complexity of ED encounter:  CAD s/p stent, GERD, DM   Interpretation of Diagnostics - I independent reviewed and interpreted the labs as followed: Cr 1.88 but near baseline, glucose 280, normal white blood cell count  - I independently visualized the following imaging with scope of interpretation limited to determining acute life threatening conditions related to emergency care: CT ab/pelvis, which revealed cholelithiasis, 2 kidney stones in the right renal pelvis with no hydronephrosis  Patient Reassessment and Ultimate Disposition/Management Workup overall reassuring and suggestive of symptomatic cholelithiasis.  On reassessment his pain had resolved and he was able to tolerate p.o.  without complication.  I discussed the utility of a ultrasound with the patient and his wife and they deferred for now given how well-appearing he was with the reassuring workup.  I felt this was appropriate and he was safe for discharge.  Advised him to follow-up with general surgery and discuss strict return precautions.  Discharged in good condition.  Patient management required discussion with the following services or consulting groups:  None  Complexity of Problems Addressed Acute complicated illness or Injury  Additional Data Reviewed and Analyzed Further history obtained from: Past medical history and medications listed in the EMR and Prior ED visit notes  Patient Encounter Risk Assessment Consideration of hospitalization      Final diagnoses:  Abdominal pain, unspecified abdominal location    ED Discharge Orders     None          Lang Norleen POUR, PA-C 01/23/24 0851    Franklyn Sid SAILOR, MD 01/25/24 [phone]

## 2024-01-22 NOTE — ED Notes (Signed)
 Pt handled PO challenge.

## 2024-01-22 NOTE — Discharge Instructions (Signed)
 Evaluation for your abdominal pain was reassuring.  As we discussed, please follow-up with general surgery and your PCP.  If you develop worsening abdominal pain, fever, persistent nausea and vomiting or any other concerning symptom please return to the ED for further evaluation.

## 2024-01-28 ENCOUNTER — Encounter: Payer: Self-pay | Admitting: *Deleted

## 2024-01-28 ENCOUNTER — Encounter: Payer: Self-pay | Admitting: General Surgery

## 2024-01-28 ENCOUNTER — Ambulatory Visit: Admitting: General Surgery

## 2024-01-28 VITALS — BP 116/68 | HR 52 | Temp 98.2°F | Resp 12 | Ht 67.0 in | Wt 200.0 lb

## 2024-01-28 DIAGNOSIS — M6208 Separation of muscle (nontraumatic), other site: Secondary | ICD-10-CM | POA: Diagnosis not present

## 2024-01-28 DIAGNOSIS — K8020 Calculus of gallbladder without cholecystitis without obstruction: Secondary | ICD-10-CM | POA: Insufficient documentation

## 2024-01-28 MED ORDER — HYDROCODONE-ACETAMINOPHEN 5-325 MG PO TABS: 1.0000 | ORAL_TABLET | Freq: Four times a day (QID) | ORAL | 0 refills | Status: DC | PRN

## 2024-01-28 NOTE — Progress Notes (Signed)
 [facility] History and Physical  Reason for Referral: Gallstones, abdominal pain  Referring Physician: Jolee Elsie RAMAN, PA   Chief Complaint   New Patient (Initial Visit)     Maurice Ross is a 83 y.o. male.  HPI:   Discussed the use of AI scribe software for clinical note transcription with the patient, who gave verbal consent to proceed.  History of Present Illness Maurice Ross is an 83 year old male with a history of kidney stones who presents with abdominal pain.  He has been experiencing abdominal pain for a few weeks, starting around November 5th. The pain is primarily located around the umbilicus and sometimes radiates across the abdomen. It tends to worsen one to two hours after eating, with no specific correlation to greasy or fatty foods. The pain is intermittent and was notably severe on a recent Monday afternoon. This pain is more in the mid abdomen and he points to the left side.   He visited the emergency room where imaging revealed gallstones and kidney stones. He believes he passed some kidney stones after straining his urine and finding particles. He has a history of kidney stones, with the current stones measuring about seven millimeters in size, located in the right kidney pelvis. Despite this, he has not experienced the typical back pain associated with kidney stones.  He has a small umbilical hernia containing fat. He also has diastasis recti. He experiences a bulging when he raises up.  For pain relief, he has been taking Tylenol  and occasionally hydrocodone . He has no nausea, vomiting, or changes in bowel habits, except for a brief period of constipation attributed to pain medication use.    Past Medical History:  Diagnosis Date   Atypical mole 05/2015   right supra pubic mild atypia   Basal cell carcinoma 2000   right jawline curet 3 exc   Basal cell carcinoma 2017   left cheek cx3 30fu   Basal cell carcinoma 2019   left clavical  cx3 16fu   CKD (chronic kidney disease), stage III (HCC)    Coronary artery disease CARDIOLOGIST-  DR DEBBY SOR   Coronary stent 1997, Last cath 1998, Last Nuc 10/24/2011-no change from previous, Last Echo 2011 EF 45%   Diabetes mellitus type 2, uncontrolled    per pt last A1c 8.8 in Aug 2017   GERD (gastroesophageal reflux disease)    History of acute anterior wall MI    1997   History of adenomatous polyp of colon    adenomatous 2004;   tubular adenoma 2013   History of basal cell carcinoma excision    05-24-2015  left cheek   History of esophageal dilatation    Hyperlipidemia    lipomet analysis LDL particle # at 786   Nodular basal cell carcinoma (BCC) 10/04/2020   Right Anterior Neck   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)    Renal calculus, right    Renal cyst    S/P coronary artery stent placement    1997  --  PCI  and stenting to LAD   SCC (squamous cell carcinoma) 2011   left neck tx with bx   SCC (squamous cell carcinoma) 2018   left forearm tx with bx   SCC (squamous cell carcinoma) 2018   right outer eyebore cx3 27fu   SCC (squamous cell carcinoma) 2018   right post neck cx3 24fu   SCCA (squamous cell carcinoma) of skin 10/04/2020  Right Malar Cheek (in situ)   SCCA (squamous cell carcinoma) of skin 10/04/2020   Right Sideburn (well diff)   Squamous cell carcinoma of skin 11/25/2019   IN SITU- left 2nd finger metacarpophalangeal joint (txpbx)   Wears glasses    Wears hearing aid    bilateral    Past Surgical History:  Procedure Laterality Date   BALLOON DILATION  01/24/2012   Procedure: BALLOON DILATION;  Surgeon: Claudis RAYMOND Rivet, MD;  [facility];  Service: Endoscopy;  Laterality: N/A;   CARDIOVASCULAR STRESS TEST  10-24-2011   dr burnard   Low risk nuclear study w/ mild to moderate perfustion defect due to infarct/scar w/  mild perinfarct ischemia in the apical septal and apical regions/  normal LV function and mild  hypokinesis in the apical and apical septal (no sign. change compared to previous study()   CATARACT EXTRACTION W/ INTRAOCULAR LENS  IMPLANT, BILATERAL  2015   COLONOSCOPY N/A 04/10/2017   Procedure: COLONOSCOPY;  Surgeon: Rivet Claudis RAYMOND, MD;  Location: [facility];  Service: Endoscopy;  Laterality: N/A;  1115   COLONOSCOPY WITH ESOPHAGOGASTRODUODENOSCOPY (EGD)  01/24/2012   Procedure: COLONOSCOPY WITH ESOPHAGOGASTRODUODENOSCOPY (EGD);  Surgeon: Claudis RAYMOND Rivet, MD;  [facility]: [facility];  Service: Endoscopy;  Laterality: N/A;  955   CORONARY ANGIOPLASTY  06/08/1996   dr debby burnard   Balloon angioplasty to  pLAD for in-stent restenosis, no change mLAD 30-50%/  narrowing  midRCA 40-50%/  mild ectasia of LCX w/ 30-50%  proximal and marginal narrowing/  mild LV dysfunction w/ anterolateral hypocontractility   CORONARY ANGIOPLASTY WITH STENT PLACEMENT  09-15-1995    dr burnard   PTCA/ Stenting (palmaz-schatz)  to proximal LAD   CYSTOSCOPY WITH STENT PLACEMENT Right 01/01/2016   Procedure: CYSTOSCOPY WITH STENT PLACEMENT;  Surgeon: Garnette Shack, MD;  Location: [facility];  Service: Urology;  Laterality: Right;   CYSTOSCOPY/RETROGRADE/URETEROSCOPY/STONE EXTRACTION WITH BASKET Right 01/01/2016   Procedure: CYSTOSCOPY/RETROGRADE/URETEROSCOPY;  Surgeon: Garnette Shack, MD;  Location[facility];  Service: Urology;  Laterality: Right;   EXTRACORPOREAL SHOCK WAVE LITHOTRIPSY  10-30-2015;   11-25-2011   HOLMIUM LASER APPLICATION Right 01/01/2016   Procedure: HOLMIUM LASER APPLICATION;  Surgeon: Garnette Shack, MD;  Location: [facility];  Service: Urology;  Laterality: Right;   LUMBAR MICRODISCECTOMY  1998   L5 -- S1   MALONEY DILATION  01/24/2012   Procedure: MALONEY DILATION;  Surgeon: Claudis RAYMOND Rivet, MD;  [facility];  Service: Endoscopy;  Laterality: N/A;   POLYPECTOMY  04/10/2017   Procedure: POLYPECTOMY;  Surgeon: Rivet Claudis RAYMOND, MD;  [facility];  Service: Endoscopy;;   SAVORY DILATION  01/24/2012   Procedure: SAVORY DILATION;  Surgeon: Claudis RAYMOND Rivet, MD;  [facility];  Service: Endoscopy;  Laterality: N/A;   TRANSTHORACIC ECHOCARDIOGRAM  01/25/2014   mild concentric LVH, ef 45-50%, grade 1 diastolic dysfunction/  mild PR    Family History  Problem Relation Age of Onset   Heart disease Mother 73   Heart disease Father 29   Healthy Son    Healthy Son     Social History   Tobacco Use   Smoking status: Former    Types: Pipe, Cigars    Quit date: 12/27/2011    Years since quitting: 12.0   Smokeless tobacco: Never  Vaping Use   Vaping status: Never Used  Substance Use Topics   Alcohol use: No  Alcohol/week: 0.0 standard drinks of alcohol   Drug use: No    Medications: I have reviewed the patient's current medications. Allergies as of 01/28/2024       Reactions   Other    General anesthesia - patient reports when he had his kidney surgery around 2018         Medication List        Accurate as of January 28, 2024  9:22 AM. If you have any questions, ask your nurse or doctor.          STOP taking these medications    fluorouracil  5 % cream Commonly known as: EFUDEX  Stopped by: Manuelita JAYSON Pander   hydrochlorothiazide  25 MG tablet Commonly known as: HYDRODIURIL  Stopped by: Manuelita JAYSON Pander       TAKE these medications    aspirin  81 MG tablet Take 1 tablet (81 mg total) by mouth daily.   carvedilol  3.125 MG tablet Commonly known as: COREG  Take 1 tablet by mouth two  times daily   Farxiga 5 MG Tabs tablet Generic drug: dapagliflozin propanediol Take 5 mg by mouth daily.   HumuLIN 70/30 (70-30) 100 UNIT/ML injection Generic drug: insulin  NPH-regular Human Inject into the skin.   lisinopril  5 MG tablet Commonly known as: ZESTRIL  Take 5 mg by mouth daily.   OneTouch Verio test strip Generic drug: glucose blood 1 each 3 (three) times  daily.   pantoprazole  40 MG tablet Commonly known as: PROTONIX  Take 40 mg by mouth daily.   ranitidine 150 MG tablet Commonly known as: ZANTAC Take 150 mg by mouth daily.   rosuvastatin  40 MG tablet Commonly known as: CRESTOR  Take 40 mg by mouth every evening.         ROS:  A comprehensive review of systems was negative except for: Gastrointestinal: positive for abdominal pain  Blood pressure 116/68, pulse (!) 52, temperature 98.2 F (36.8 C), temperature source Oral, resp. rate 12, height 5' 7 (1.702 m), weight 200 lb (90.7 kg), SpO2 95%.  Physical Exam GENERAL: Alert, cooperative, well developed, no acute distress. HEENT: Normocephalic, normal oropharynx, moist mucous membranes. CHEST: Clear to auscultation bilaterally, no wheezes, rhonchi, or crackles. CARDIOVASCULAR: Normal heart rate and rhythm ABDOMEN: Soft, non-tender, non-distended, without organomegaly, normal bowel sounds. Small umbilical hernia with fat content. Thin abdominal wall with weak muscles. Diastasis recti noted. Umbilical hernia with fat, nontender, no tenderness in the RUQ  EXTREMITIES: No cyanosis or edema. NEUROLOGICAL: Cranial nerves grossly intact, moves all extremities without gross motor or sensory deficit.  Results: Personally reviewed and showed him and his wife the imaging. Diastasis recti, umbilical hernia with fat, gallstones  CLINICAL DATA:  Abdominal pain.   EXAM: CT ABDOMEN AND PELVIS WITH CONTRAST   TECHNIQUE: Multidetector CT imaging of the abdomen and pelvis was performed using the standard protocol following bolus administration of intravenous contrast.   RADIATION DOSE REDUCTION: This exam was performed according to the departmental dose-optimization program which includes automated exposure control, adjustment of the mA and/or kV according to patient size and/or use of iterative reconstruction technique.   CONTRAST:  OMNIPAQUE  IOHEXOL  300 MG/ML  SOLN   COMPARISON:  CT  abdomen pelvis dated 01/08/2016.   FINDINGS: Lower chest: The visualized lung bases are clear. There is advanced coronary vascular calcification.   No intra-abdominal free air or free fluid.   Hepatobiliary: The liver is unremarkable. No biliary dilatation. Small gallstones. No pericholecystic fluid or evidence of acute cholecystitis by CT.   Pancreas: Small  pancreatic cyst predominantly involving the distal pancreas. Mild atrophy of the pancreas with dilatation of the main pancreatic duct distally. The cystic lesions are not characterized, possibly side branch IPMNs. These can be better evaluated with MRI without and with contrast on a nonemergent/outpatient basis. No active inflammatory changes.   Spleen: Normal in size without focal abnormality.   Adrenals/Urinary Tract: The adrenal glands unremarkable. Mild bilateral renal parenchyma atrophy. Small bilateral renal cysts. Two stones noted in the right renal pelvis with the larger measuring  approximately 7 mm. There is no hydronephrosis on either side. The visualized ureters and urinary bladder appear unremarkable.   Stomach/Bowel: There is sigmoid diverticulosis. There is no bowel obstruction or active inflammation. The appendix is normal.   Vascular/Lymphatic: Advanced aortoiliac atherosclerotic disease. The IVC is unremarkable. No portal venous gas. There is no adenopathy.   Reproductive: Enlarged prostate gland measuring 7 cm in transverse axial diameter.   Other: Small fat containing umbilical hernia.   Musculoskeletal: Osteopenia with degenerative changes of the spine. No acute osseous pathology.   IMPRESSION: 1. No acute intra-abdominal or pelvic pathology. 2. Cholelithiasis. 3. Sigmoid diverticulosis. No bowel obstruction. Normal appendix. 4. Two stones in the right renal pelvis with the larger measuring approximately 7 mm. No hydronephrosis on either side. 5. Enlarged prostate gland. 6.  Aortic Atherosclerosis  (ICD10-I70.0).  Assessment & Plan Abdominal pain with gallstones without cholecystitis or obstruction.  Intermittent abdominal pain not typical for gallbladder pain. Gallstones present without inflammation or obstruction on CT. Pain not clearly related to gallstones. Differential includes diastasis recti and umbilical hernia, versus just muscle strain. Decision to monitor symptoms due to lack of definitive evidence linking gallstones to pain. - Maintain food diary to track pain patterns and triggers. - Use Tylenol  for pain management as needed. - Prescribed limited supply of hydrocodone  5mg  (#5) for severe pain. - Scheduled follow-up in early December to reassess symptoms.  Nephrolithiasis (kidney stones), stable Kidney stones approximately 7 mm in size in kidney pelvis. No symptoms or pain suggestive of obstruction or passage. Likely to remain stable without intervention.  Diastasis recti Presence of diastasis recti with separation of rectus muscles. No definitive link to current pain symptoms, but may contribute.  Umbilical hernia (fat-containing, asymptomatic) Small fat-containing umbilical hernia noted on imaging. Asymptomatic and not contributing to current pain symptoms.    All questions were answered to the satisfaction of the patient and family.  Future Appointments  Date Time Provider Department Center  02/17/2024  9:15 AM Kallie Manuelita BROCKS, MD [facility] None      Manuelita BROCKS Kallie 01/28/2024, 9:22 AM

## 2024-01-28 NOTE — Patient Instructions (Signed)
 See what you think about your pain over the next few weeks and eating. If you eat greasy fatty foods pain attention if this causes worsening pain. Take tylenol  for pain and the hydrocodone  if you absolutely need to.  Keep a journal of your pain.

## 2024-02-11 NOTE — Progress Notes (Signed)
 Maurice Ross                                          MRN: [id number]   02/11/2024   The [facility] Quality Team Specialist reviewed this patient medical record for the purposes of chart review for care gap closure. The following were reviewed: chart review for care gap closure-kidney health evaluation for diabetes:eGFR  and uACR.    [facility] Quality Team

## 2024-02-17 ENCOUNTER — Telehealth: Payer: Self-pay | Admitting: *Deleted

## 2024-02-17 ENCOUNTER — Encounter: Payer: Self-pay | Admitting: General Surgery

## 2024-02-17 ENCOUNTER — Ambulatory Visit: Admitting: General Surgery

## 2024-02-17 VITALS — BP 124/68 | HR 61 | Temp 98.0°F | Resp 16 | Ht 67.0 in | Wt 196.0 lb

## 2024-02-17 DIAGNOSIS — M6208 Separation of muscle (nontraumatic), other site: Secondary | ICD-10-CM

## 2024-02-17 DIAGNOSIS — K429 Umbilical hernia without obstruction or gangrene: Secondary | ICD-10-CM | POA: Insufficient documentation

## 2024-02-17 DIAGNOSIS — K8020 Calculus of gallbladder without cholecystitis without obstruction: Secondary | ICD-10-CM

## 2024-02-17 NOTE — Patient Instructions (Addendum)
 Monitor for any right sided pain related to gallstones.  Umbilical hernia with fat. If you have any pain in the area or any knot gets stuck out and hard, then go to the ED if that does not go back in after 1 hr.

## 2024-02-17 NOTE — Progress Notes (Signed)
 [facility]  Patient says he has had no further abdominal pain. His pain had been just under the umbilicus. He denies any pain with eating. He has tried to eliminate some white bread and meats.  Blood pressure 124/68, pulse 61, temperature 98 F (36.7 C), temperature source Oral, resp. rate 16, height 5' 7 (1.702 m), weight 196 lb (88.9 kg), SpO2 97%. Soft, nontender, rotund, umbilical hernia reduced   Patient with lower abdominal pain. Not typical of gallbladder pain. No longer having pain.   Monitor for any right sided pain related to gallstones.  Umbilical hernia with fat. If you have any pain in the area or any knot gets stuck out and hard, then go to the ED if that does not go back in after 1 hr.   PRN follow up   Manuelita Pander, MD [facility] [address] [place], [place] [address] [phone] (office)

## 2024-02-17 NOTE — Telephone Encounter (Signed)
 Received return fax from nephrology:  Nephrologist: Gaynell Clos, MD [facility]  [phone]~ telephone/ [phone]~ fax  Moderate risk due to CKD stage 3B  Avoid NSAID's Hold Lisinopril  the day before and the day after surgery Hold Farxiga x3 days prior to and x3 days after surgery.

## 2024-02-19 ENCOUNTER — Ambulatory Visit: Admitting: General Surgery
